# Patient Record
Sex: Female | Born: 1935 | Race: White | Hispanic: No | Marital: Married | Smoking: Never smoker
Health system: Southern US, Community
[De-identification: ages and names within clinical notes are randomized; demographics above are authoritative.]

## PROBLEM LIST (undated history)

## (undated) DIAGNOSIS — R350 Frequency of micturition: Secondary | ICD-10-CM

## (undated) DIAGNOSIS — E785 Hyperlipidemia, unspecified: Secondary | ICD-10-CM

## (undated) DIAGNOSIS — T8459XA Infection and inflammatory reaction due to other internal joint prosthesis, initial encounter: Secondary | ICD-10-CM

## (undated) DIAGNOSIS — I351 Nonrheumatic aortic (valve) insufficiency: Secondary | ICD-10-CM

## (undated) DIAGNOSIS — I340 Nonrheumatic mitral (valve) insufficiency: Secondary | ICD-10-CM

## (undated) DIAGNOSIS — Z87440 Personal history of urinary (tract) infections: Secondary | ICD-10-CM

## (undated) DIAGNOSIS — R351 Nocturia: Secondary | ICD-10-CM

## (undated) DIAGNOSIS — K219 Gastro-esophageal reflux disease without esophagitis: Secondary | ICD-10-CM

## (undated) DIAGNOSIS — Z973 Presence of spectacles and contact lenses: Secondary | ICD-10-CM

## (undated) DIAGNOSIS — I5189 Other ill-defined heart diseases: Secondary | ICD-10-CM

## (undated) DIAGNOSIS — J45909 Unspecified asthma, uncomplicated: Secondary | ICD-10-CM

## (undated) DIAGNOSIS — M8580 Other specified disorders of bone density and structure, unspecified site: Secondary | ICD-10-CM

## (undated) DIAGNOSIS — R32 Unspecified urinary incontinence: Secondary | ICD-10-CM

## (undated) DIAGNOSIS — E669 Obesity, unspecified: Secondary | ICD-10-CM

## (undated) DIAGNOSIS — F419 Anxiety disorder, unspecified: Secondary | ICD-10-CM

## (undated) DIAGNOSIS — C801 Malignant (primary) neoplasm, unspecified: Secondary | ICD-10-CM

## (undated) DIAGNOSIS — I4819 Other persistent atrial fibrillation: Secondary | ICD-10-CM

## (undated) DIAGNOSIS — E079 Disorder of thyroid, unspecified: Secondary | ICD-10-CM

## (undated) DIAGNOSIS — Z96649 Presence of unspecified artificial hip joint: Secondary | ICD-10-CM

## (undated) DIAGNOSIS — I10 Essential (primary) hypertension: Secondary | ICD-10-CM

## (undated) DIAGNOSIS — R3915 Urgency of urination: Secondary | ICD-10-CM

## (undated) DIAGNOSIS — M1990 Unspecified osteoarthritis, unspecified site: Secondary | ICD-10-CM

## (undated) HISTORY — PX: MELANOMA EXCISION: SHX5266

## (undated) HISTORY — PX: BREAST BIOPSY: SHX20

## (undated) HISTORY — PX: ABDOMINAL HYSTERECTOMY: SHX81

## (undated) HISTORY — PX: CHOLECYSTECTOMY: SHX55

## (undated) HISTORY — PX: OTHER SURGICAL HISTORY: SHX169

---

## 2004-07-09 ENCOUNTER — Ambulatory Visit: Payer: Self-pay | Admitting: Unknown Physician Specialty

## 2005-02-25 ENCOUNTER — Ambulatory Visit: Payer: Self-pay | Admitting: Internal Medicine

## 2005-05-21 ENCOUNTER — Ambulatory Visit: Payer: Self-pay | Admitting: Unknown Physician Specialty

## 2005-10-26 ENCOUNTER — Ambulatory Visit: Payer: Self-pay | Admitting: Internal Medicine

## 2006-09-30 ENCOUNTER — Ambulatory Visit: Payer: Self-pay | Admitting: Internal Medicine

## 2006-10-06 ENCOUNTER — Ambulatory Visit: Payer: Self-pay | Admitting: Internal Medicine

## 2006-10-25 ENCOUNTER — Ambulatory Visit: Payer: Self-pay | Admitting: Internal Medicine

## 2006-10-28 ENCOUNTER — Ambulatory Visit: Payer: Self-pay | Admitting: Internal Medicine

## 2006-11-29 ENCOUNTER — Ambulatory Visit: Payer: Self-pay | Admitting: Pain Medicine

## 2006-12-14 ENCOUNTER — Ambulatory Visit: Payer: Self-pay | Admitting: Pain Medicine

## 2006-12-29 ENCOUNTER — Ambulatory Visit: Payer: Self-pay | Admitting: Physician Assistant

## 2007-02-01 ENCOUNTER — Ambulatory Visit: Payer: Self-pay | Admitting: Pain Medicine

## 2007-02-08 ENCOUNTER — Ambulatory Visit: Payer: Self-pay | Admitting: Pain Medicine

## 2007-03-01 ENCOUNTER — Ambulatory Visit: Payer: Self-pay | Admitting: Internal Medicine

## 2007-03-09 ENCOUNTER — Ambulatory Visit: Payer: Self-pay | Admitting: Physician Assistant

## 2007-03-26 ENCOUNTER — Inpatient Hospital Stay: Payer: Self-pay | Admitting: Internal Medicine

## 2007-04-01 ENCOUNTER — Ambulatory Visit: Payer: Self-pay | Admitting: Internal Medicine

## 2007-04-12 ENCOUNTER — Emergency Department: Payer: Self-pay | Admitting: Unknown Physician Specialty

## 2007-04-12 ENCOUNTER — Ambulatory Visit: Payer: Self-pay | Admitting: Internal Medicine

## 2007-05-02 ENCOUNTER — Ambulatory Visit: Payer: Self-pay | Admitting: Internal Medicine

## 2008-02-15 ENCOUNTER — Ambulatory Visit: Payer: Self-pay | Admitting: Internal Medicine

## 2008-08-31 HISTORY — PX: HEMIARTHROPLASTY HIP: SUR652

## 2009-02-15 ENCOUNTER — Ambulatory Visit: Payer: Self-pay | Admitting: Internal Medicine

## 2009-05-29 ENCOUNTER — Ambulatory Visit: Payer: Self-pay | Admitting: Physician Assistant

## 2009-06-11 ENCOUNTER — Ambulatory Visit: Payer: Self-pay | Admitting: Pain Medicine

## 2009-07-02 ENCOUNTER — Ambulatory Visit: Payer: Self-pay | Admitting: Physician Assistant

## 2009-07-06 ENCOUNTER — Inpatient Hospital Stay: Payer: Self-pay | Admitting: Specialist

## 2009-07-12 ENCOUNTER — Encounter: Payer: Self-pay | Admitting: Internal Medicine

## 2010-03-19 ENCOUNTER — Ambulatory Visit: Payer: Self-pay | Admitting: Internal Medicine

## 2011-02-13 ENCOUNTER — Ambulatory Visit: Payer: Self-pay | Admitting: Internal Medicine

## 2011-04-23 ENCOUNTER — Ambulatory Visit: Payer: Self-pay | Admitting: Family Medicine

## 2011-09-01 HISTORY — PX: HEMIARTHROPLASTY HIP: SUR652

## 2011-09-01 HISTORY — PX: INCISION AND DRAINAGE HIP: SHX1801

## 2012-01-20 ENCOUNTER — Ambulatory Visit: Payer: Self-pay | Admitting: Internal Medicine

## 2012-01-20 ENCOUNTER — Inpatient Hospital Stay: Payer: Self-pay | Admitting: Family Medicine

## 2012-01-20 DIAGNOSIS — I10 Essential (primary) hypertension: Secondary | ICD-10-CM

## 2012-01-20 LAB — URINALYSIS, COMPLETE
Bacteria: NONE SEEN
Bilirubin,UR: NEGATIVE
Glucose,UR: NEGATIVE mg/dL (ref 0–75)
Ketone: NEGATIVE
Nitrite: POSITIVE
Ph: 7 (ref 4.5–8.0)
Protein: NEGATIVE
RBC,UR: 8 /HPF (ref 0–5)
Specific Gravity: 1.016 (ref 1.003–1.030)
Squamous Epithelial: <1
WBC UR: 56 /HPF (ref 0–5)

## 2012-01-20 LAB — CBC WITH DIFFERENTIAL/PLATELET
Eosinophil %: 0.1 %
HCT: 38.9 % (ref 35.0–47.0)
HGB: 13 g/dL (ref 12.0–16.0)
Lymphocyte %: 10.4 %
MCH: 31.4 pg (ref 26.0–34.0)
MCV: 94 fL (ref 80–100)
Monocyte #: 0.6 x10 3/mm (ref 0.2–0.9)
Monocyte %: 4.7 %
Neutrophil %: 84.7 %
Platelet: 298 10*3/uL (ref 150–440)
RBC: 4.15 10*6/uL (ref 3.80–5.20)
WBC: 12.8 10*3/uL — ABNORMAL HIGH (ref 3.6–11.0)

## 2012-01-20 LAB — BASIC METABOLIC PANEL
Anion Gap: 9 (ref 7–16)
BUN: 12 mg/dL (ref 7–18)
Calcium, Total: 8.8 mg/dL (ref 8.5–10.1)
Chloride: 91 mmol/L — ABNORMAL LOW (ref 98–107)
Co2: 27 mmol/L (ref 21–32)
Creatinine: 0.63 mg/dL (ref 0.60–1.30)
EGFR (African American): >60
Osmolality: 256 (ref 275–301)
Sodium: 127 mmol/L — ABNORMAL LOW (ref 136–145)

## 2012-01-20 LAB — PROTIME-INR
INR: 0.9
Prothrombin Time: 12.6 secs (ref 11.5–14.7)

## 2012-01-20 LAB — APTT: Activated PTT: 37.2 secs — ABNORMAL HIGH (ref 23.6–35.9)

## 2012-01-21 LAB — CBC WITH DIFFERENTIAL/PLATELET
Basophil #: 0 10*3/uL (ref 0.0–0.1)
Eosinophil #: 0 10*3/uL (ref 0.0–0.7)
Eosinophil %: 0.5 %
HCT: 35.7 % (ref 35.0–47.0)
HGB: 12.2 g/dL (ref 12.0–16.0)
Lymphocyte %: 11.2 %
MCH: 32.3 pg (ref 26.0–34.0)
MCHC: 34.1 g/dL (ref 32.0–36.0)
MCV: 95 fL (ref 80–100)
Monocyte #: 0.5 x10 3/mm (ref 0.2–0.9)
Neutrophil %: 83.3 %
RDW: 12.1 % (ref 11.5–14.5)
WBC: 10.3 10*3/uL (ref 3.6–11.0)

## 2012-01-21 LAB — BASIC METABOLIC PANEL
Anion Gap: 9 (ref 7–16)
Chloride: 92 mmol/L — ABNORMAL LOW (ref 98–107)
EGFR (African American): >60
Glucose: 100 mg/dL — ABNORMAL HIGH (ref 65–99)
Osmolality: 254 (ref 275–301)

## 2012-01-21 LAB — TSH: Thyroid Stimulating Horm: 0.266 u[IU]/mL — ABNORMAL LOW

## 2012-01-22 LAB — CBC WITH DIFFERENTIAL/PLATELET
Basophil %: 0.3 %
Eosinophil #: 0.1 10*3/uL (ref 0.0–0.7)
HCT: 32.3 % — ABNORMAL LOW (ref 35.0–47.0)
HGB: 11.1 g/dL — ABNORMAL LOW (ref 12.0–16.0)
Lymphocyte %: 14.5 %
MCV: 95 fL (ref 80–100)
Monocyte %: 7.6 %
Neutrophil #: 6.4 10*3/uL (ref 1.4–6.5)
Platelet: 241 10*3/uL (ref 150–440)
RBC: 3.4 10*6/uL — ABNORMAL LOW (ref 3.80–5.20)
WBC: 8.4 10*3/uL (ref 3.6–11.0)

## 2012-01-22 LAB — BASIC METABOLIC PANEL
Anion Gap: 9 (ref 7–16)
BUN: 9 mg/dL (ref 7–18)
Calcium, Total: 7.6 mg/dL — ABNORMAL LOW (ref 8.5–10.1)
Chloride: 94 mmol/L — ABNORMAL LOW (ref 98–107)
Co2: 27 mmol/L (ref 21–32)
Creatinine: 0.58 mg/dL — ABNORMAL LOW (ref 0.60–1.30)
EGFR (African American): >60
EGFR (Non-African Amer.): >60
Glucose: 102 mg/dL — ABNORMAL HIGH (ref 65–99)
Potassium: 3.6 mmol/L (ref 3.5–5.1)
Sodium: 130 mmol/L — ABNORMAL LOW (ref 136–145)

## 2012-01-23 LAB — CBC WITH DIFFERENTIAL/PLATELET
Basophil #: 0 10*3/uL (ref 0.0–0.1)
Basophil %: 0.4 %
Eosinophil #: 0.2 10*3/uL (ref 0.0–0.7)
Eosinophil %: 2.4 %
HCT: 32 % — ABNORMAL LOW (ref 35.0–47.0)
HGB: 11 g/dL — ABNORMAL LOW (ref 12.0–16.0)
MCV: 95 fL (ref 80–100)
Monocyte #: 0.7 x10 3/mm (ref 0.2–0.9)
Neutrophil %: 79.2 %
Platelet: 248 10*3/uL (ref 150–440)
RBC: 3.37 10*6/uL — ABNORMAL LOW (ref 3.80–5.20)
WBC: 8.6 10*3/uL (ref 3.6–11.0)

## 2012-01-23 LAB — BASIC METABOLIC PANEL
Anion Gap: 10 (ref 7–16)
BUN: 6 mg/dL — ABNORMAL LOW (ref 7–18)
Calcium, Total: 7.7 mg/dL — ABNORMAL LOW (ref 8.5–10.1)
Co2: 28 mmol/L (ref 21–32)
Creatinine: 0.45 mg/dL — ABNORMAL LOW (ref 0.60–1.30)
EGFR (African American): >60
EGFR (Non-African Amer.): >60
Glucose: 92 mg/dL (ref 65–99)
Osmolality: 258 (ref 275–301)
Potassium: 3.5 mmol/L (ref 3.5–5.1)
Sodium: 130 mmol/L — ABNORMAL LOW (ref 136–145)

## 2012-01-25 LAB — BASIC METABOLIC PANEL
BUN: 11 mg/dL (ref 7–18)
Calcium, Total: 7.7 mg/dL — ABNORMAL LOW (ref 8.5–10.1)
Chloride: 91 mmol/L — ABNORMAL LOW (ref 98–107)
Co2: 28 mmol/L (ref 21–32)
EGFR (Non-African Amer.): >60
Glucose: 89 mg/dL (ref 65–99)
Osmolality: 256 (ref 275–301)
Potassium: 3.1 mmol/L — ABNORMAL LOW (ref 3.5–5.1)
Sodium: 128 mmol/L — ABNORMAL LOW (ref 136–145)

## 2012-01-25 LAB — CBC WITH DIFFERENTIAL/PLATELET
Basophil #: 0 10*3/uL (ref 0.0–0.1)
Basophil %: 0.3 %
Eosinophil %: 3.1 %
HGB: 9.4 g/dL — ABNORMAL LOW (ref 12.0–16.0)
Lymphocyte #: 1.4 10*3/uL (ref 1.0–3.6)
MCH: 32.7 pg (ref 26.0–34.0)
MCHC: 34.7 g/dL (ref 32.0–36.0)
MCV: 94 fL (ref 80–100)
Monocyte #: 1 x10 3/mm — ABNORMAL HIGH (ref 0.2–0.9)
Monocyte %: 10.3 %

## 2012-01-26 LAB — BASIC METABOLIC PANEL
Anion Gap: 8 (ref 7–16)
BUN: 8 mg/dL (ref 7–18)
Calcium, Total: 7.6 mg/dL — ABNORMAL LOW (ref 8.5–10.1)
Chloride: 90 mmol/L — ABNORMAL LOW (ref 98–107)
Co2: 30 mmol/L (ref 21–32)
Creatinine: 0.42 mg/dL — ABNORMAL LOW (ref 0.60–1.30)
EGFR (African American): >60
Potassium: 3.3 mmol/L — ABNORMAL LOW (ref 3.5–5.1)

## 2012-01-26 LAB — CBC WITH DIFFERENTIAL/PLATELET
Basophil #: 0 10*3/uL (ref 0.0–0.1)
Eosinophil %: 2.9 %
HGB: 8.9 g/dL — ABNORMAL LOW (ref 12.0–16.0)
Lymphocyte #: 1.7 10*3/uL (ref 1.0–3.6)
Lymphocyte %: 19.1 %
MCH: 32.7 pg (ref 26.0–34.0)
MCHC: 34.8 g/dL (ref 32.0–36.0)
MCV: 94 fL (ref 80–100)
Monocyte %: 9.9 %
Neutrophil #: 5.9 10*3/uL (ref 1.4–6.5)
Neutrophil %: 67.6 %
WBC: 8.7 10*3/uL (ref 3.6–11.0)

## 2012-01-27 LAB — BASIC METABOLIC PANEL
BUN: 6 mg/dL — ABNORMAL LOW (ref 7–18)
Calcium, Total: 7.8 mg/dL — ABNORMAL LOW (ref 8.5–10.1)
Chloride: 91 mmol/L — ABNORMAL LOW (ref 98–107)
Creatinine: 0.54 mg/dL — ABNORMAL LOW (ref 0.60–1.30)
EGFR (Non-African Amer.): >60
Osmolality: 257 (ref 275–301)
Potassium: 3.6 mmol/L (ref 3.5–5.1)
Sodium: 129 mmol/L — ABNORMAL LOW (ref 136–145)

## 2012-01-27 LAB — CBC WITH DIFFERENTIAL/PLATELET
HCT: 26.4 % — ABNORMAL LOW (ref 35.0–47.0)
HGB: 9.2 g/dL — ABNORMAL LOW (ref 12.0–16.0)
MCHC: 34.9 g/dL (ref 32.0–36.0)
MCV: 95 fL (ref 80–100)
Neutrophil %: 71.6 %
RBC: 2.78 10*6/uL — ABNORMAL LOW (ref 3.80–5.20)
RDW: 12.3 % (ref 11.5–14.5)

## 2012-01-28 ENCOUNTER — Encounter: Payer: Self-pay | Admitting: Internal Medicine

## 2012-01-29 LAB — URINALYSIS, COMPLETE
Bilirubin,UR: NEGATIVE
Blood: NEGATIVE
Glucose,UR: NEGATIVE mg/dL (ref 0–75)
Ketone: NEGATIVE
Protein: NEGATIVE
Specific Gravity: 1.006 (ref 1.003–1.030)
Squamous Epithelial: 1
Transitional Epi: <1
WBC UR: 197 /HPF (ref 0–5)

## 2012-01-30 ENCOUNTER — Encounter: Payer: Self-pay | Admitting: Internal Medicine

## 2012-01-31 LAB — URINE CULTURE

## 2012-02-02 LAB — CBC WITH DIFFERENTIAL/PLATELET
Basophil #: 0.2 10*3/uL — ABNORMAL HIGH (ref 0.0–0.1)
Eosinophil #: 0.1 10*3/uL (ref 0.0–0.7)
Eosinophil %: 1 %
HCT: 32.2 % — ABNORMAL LOW (ref 35.0–47.0)
HGB: 10.5 g/dL — ABNORMAL LOW (ref 12.0–16.0)
Lymphocyte %: 15.7 %
MCH: 32 pg (ref 26.0–34.0)
Monocyte #: 0.7 x10 3/mm (ref 0.2–0.9)
Monocyte %: 6 %
Neutrophil %: 75.8 %
Platelet: 692 10*3/uL — ABNORMAL HIGH (ref 150–440)
RDW: 13.6 % (ref 11.5–14.5)
WBC: 11.5 10*3/uL — ABNORMAL HIGH (ref 3.6–11.0)

## 2012-02-02 LAB — BASIC METABOLIC PANEL
Anion Gap: 12 (ref 7–16)
EGFR (African American): >60
EGFR (Non-African Amer.): >60
Osmolality: 253 (ref 275–301)
Potassium: 3.8 mmol/L (ref 3.5–5.1)

## 2012-02-03 LAB — URINALYSIS, COMPLETE
Bacteria: NONE SEEN
Bilirubin,UR: NEGATIVE
Blood: NEGATIVE
Glucose,UR: NEGATIVE mg/dL (ref 0–75)
Hyaline Cast: 1
Ph: 6 (ref 4.5–8.0)
RBC,UR: 1 /HPF (ref 0–5)
Specific Gravity: 1.015 (ref 1.003–1.030)
Squamous Epithelial: 4
WBC UR: 3 /HPF (ref 0–5)

## 2012-02-11 LAB — BASIC METABOLIC PANEL
Anion Gap: 8 (ref 7–16)
BUN: 10 mg/dL (ref 7–18)
Calcium, Total: 9.1 mg/dL (ref 8.5–10.1)
Chloride: 99 mmol/L (ref 98–107)
Co2: 27 mmol/L (ref 21–32)
Creatinine: 0.61 mg/dL (ref 0.60–1.30)
EGFR (African American): >60
EGFR (Non-African Amer.): >60
Glucose: 99 mg/dL (ref 65–99)
Osmolality: 267 (ref 275–301)
Potassium: 3.3 mmol/L — ABNORMAL LOW (ref 3.5–5.1)
Sodium: 134 mmol/L — ABNORMAL LOW (ref 136–145)

## 2012-02-18 ENCOUNTER — Ambulatory Visit: Payer: Self-pay | Admitting: Orthopedic Surgery

## 2012-02-22 LAB — WOUND CULTURE

## 2012-02-24 LAB — URINALYSIS, COMPLETE
Bilirubin,UR: NEGATIVE
Glucose,UR: NEGATIVE mg/dL (ref 0–75)
Nitrite: POSITIVE
Ph: 7 (ref 4.5–8.0)
RBC,UR: 4 /HPF (ref 0–5)
Specific Gravity: 1.014 (ref 1.003–1.030)
Squamous Epithelial: 3
WBC UR: 52 /HPF (ref 0–5)

## 2012-02-26 ENCOUNTER — Ambulatory Visit: Payer: Self-pay | Admitting: Internal Medicine

## 2012-02-26 LAB — URINE CULTURE

## 2012-02-27 LAB — VANCOMYCIN, TROUGH: Vancomycin, Trough: 1 ug/mL — ABNORMAL LOW (ref 10–20)

## 2012-02-28 LAB — VANCOMYCIN, TROUGH: Vancomycin, Trough: 4 ug/mL — ABNORMAL LOW (ref 10–20)

## 2012-02-28 LAB — CREATININE, SERUM
Creatinine: 0.75 mg/dL (ref 0.60–1.30)
EGFR (Non-African Amer.): >60

## 2012-02-29 ENCOUNTER — Encounter: Payer: Self-pay | Admitting: Internal Medicine

## 2012-02-29 LAB — VANCOMYCIN, TROUGH: Vancomycin, Trough: 8 ug/mL — ABNORMAL LOW (ref 10–20)

## 2012-03-01 LAB — VANCOMYCIN, TROUGH: Vancomycin, Trough: 7 ug/mL — ABNORMAL LOW (ref 10–20)

## 2012-03-01 LAB — CREATININE, SERUM: Creatinine: 0.61 mg/dL (ref 0.60–1.30)

## 2012-03-02 LAB — BASIC METABOLIC PANEL
BUN: 5 mg/dL — ABNORMAL LOW (ref 7–18)
Chloride: 89 mmol/L — ABNORMAL LOW (ref 98–107)
Creatinine: 0.71 mg/dL (ref 0.60–1.30)
EGFR (African American): >60
EGFR (Non-African Amer.): >60
Glucose: 110 mg/dL — ABNORMAL HIGH (ref 65–99)
Osmolality: 249 (ref 275–301)
Potassium: 2.9 mmol/L — ABNORMAL LOW (ref 3.5–5.1)

## 2012-03-02 LAB — CBC WITH DIFFERENTIAL/PLATELET
Basophil %: 0.6 %
Eosinophil #: 0 10*3/uL (ref 0.0–0.7)
Eosinophil %: 0.2 %
HCT: 33.4 % — ABNORMAL LOW (ref 35.0–47.0)
HGB: 10.8 g/dL — ABNORMAL LOW (ref 12.0–16.0)
Lymphocyte #: 1.3 10*3/uL (ref 1.0–3.6)
MCHC: 32.4 g/dL (ref 32.0–36.0)
MCV: 95 fL (ref 80–100)
Monocyte #: 1 x10 3/mm — ABNORMAL HIGH (ref 0.2–0.9)
Monocyte %: 9.8 %
Neutrophil #: 7.8 10*3/uL — ABNORMAL HIGH (ref 1.4–6.5)
Platelet: 509 10*3/uL — ABNORMAL HIGH (ref 150–440)
RBC: 3.52 10*6/uL — ABNORMAL LOW (ref 3.80–5.20)
WBC: 10.1 10*3/uL (ref 3.6–11.0)

## 2012-03-03 ENCOUNTER — Inpatient Hospital Stay: Payer: Self-pay | Admitting: Family Medicine

## 2012-03-03 LAB — COMPREHENSIVE METABOLIC PANEL
Albumin: 2.6 g/dL — ABNORMAL LOW (ref 3.4–5.0)
Alkaline Phosphatase: 125 U/L (ref 50–136)
BUN: 5 mg/dL — ABNORMAL LOW (ref 7–18)
Bilirubin,Total: 0.5 mg/dL (ref 0.2–1.0)
Calcium, Total: 8.2 mg/dL — ABNORMAL LOW (ref 8.5–10.1)
Co2: 27 mmol/L (ref 21–32)
EGFR (Non-African Amer.): >60
Glucose: 105 mg/dL — ABNORMAL HIGH (ref 65–99)
SGOT(AST): 26 U/L (ref 15–37)
SGPT (ALT): 29 U/L
Sodium: 126 mmol/L — ABNORMAL LOW (ref 136–145)

## 2012-03-03 LAB — CBC WITH DIFFERENTIAL/PLATELET
Basophil #: 0 10*3/uL (ref 0.0–0.1)
Basophil %: 0.5 %
Eosinophil %: 0.1 %
HCT: 32.1 % — ABNORMAL LOW (ref 35.0–47.0)
HGB: 10.7 g/dL — ABNORMAL LOW (ref 12.0–16.0)
Lymphocyte #: 1.4 10*3/uL (ref 1.0–3.6)
MCH: 30.7 pg (ref 26.0–34.0)
MCHC: 33.5 g/dL (ref 32.0–36.0)
Monocyte %: 11.7 %
Neutrophil #: 5.3 10*3/uL (ref 1.4–6.5)
Platelet: 537 10*3/uL — ABNORMAL HIGH (ref 150–440)

## 2012-03-03 LAB — URINALYSIS, COMPLETE
Bacteria: NONE SEEN
Bilirubin,UR: NEGATIVE
Glucose,UR: NEGATIVE mg/dL (ref 0–75)
Leukocyte Esterase: NEGATIVE
Ph: 7 (ref 4.5–8.0)
Specific Gravity: 1.014 (ref 1.003–1.030)
Squamous Epithelial: NONE SEEN

## 2012-03-03 LAB — SEDIMENTATION RATE: Erythrocyte Sed Rate: 64 mm/hr — ABNORMAL HIGH (ref 0–30)

## 2012-03-03 LAB — MAGNESIUM: Magnesium: 1.4 mg/dL — ABNORMAL LOW

## 2012-03-04 LAB — CBC WITH DIFFERENTIAL/PLATELET
Basophil #: 0.1 10*3/uL (ref 0.0–0.1)
HCT: 31.2 % — ABNORMAL LOW (ref 35.0–47.0)
Lymphocyte #: 1.8 10*3/uL (ref 1.0–3.6)
Lymphocyte %: 31.8 %
Monocyte %: 14.3 %
Neutrophil #: 3 10*3/uL (ref 1.4–6.5)
Neutrophil %: 51.8 %
Platelet: 523 10*3/uL — ABNORMAL HIGH (ref 150–440)
RDW: 13.8 % (ref 11.5–14.5)
WBC: 5.7 10*3/uL (ref 3.6–11.0)

## 2012-03-04 LAB — BASIC METABOLIC PANEL
Chloride: 97 mmol/L — ABNORMAL LOW (ref 98–107)
Co2: 30 mmol/L (ref 21–32)
Creatinine: 0.68 mg/dL (ref 0.60–1.30)
Potassium: 3.5 mmol/L (ref 3.5–5.1)

## 2012-03-04 LAB — URINE CULTURE

## 2012-03-06 LAB — BASIC METABOLIC PANEL
BUN: 4 mg/dL — ABNORMAL LOW (ref 7–18)
Calcium, Total: 8.1 mg/dL — ABNORMAL LOW (ref 8.5–10.1)
Chloride: 97 mmol/L — ABNORMAL LOW (ref 98–107)
Creatinine: 0.54 mg/dL — ABNORMAL LOW (ref 0.60–1.30)
Potassium: 3.7 mmol/L (ref 3.5–5.1)
Sodium: 132 mmol/L — ABNORMAL LOW (ref 136–145)

## 2012-03-07 LAB — VANCOMYCIN, TROUGH: Vancomycin, Trough: 3 ug/mL — ABNORMAL LOW (ref 10–20)

## 2012-03-07 LAB — BASIC METABOLIC PANEL
Anion Gap: 7 (ref 7–16)
Calcium, Total: 8.8 mg/dL (ref 8.5–10.1)
Chloride: 94 mmol/L — ABNORMAL LOW (ref 98–107)
Co2: 27 mmol/L (ref 21–32)
EGFR (African American): >60
Glucose: 93 mg/dL (ref 65–99)
Sodium: 128 mmol/L — ABNORMAL LOW (ref 136–145)

## 2012-03-07 LAB — WOUND CULTURE

## 2012-03-08 LAB — CULTURE, BLOOD (SINGLE)

## 2012-03-25 LAB — COMPREHENSIVE METABOLIC PANEL
Albumin: 3.1 g/dL — ABNORMAL LOW (ref 3.4–5.0)
Anion Gap: 12 (ref 7–16)
Calcium, Total: 9.7 mg/dL (ref 8.5–10.1)
Chloride: 88 mmol/L — ABNORMAL LOW (ref 98–107)
EGFR (African American): >60
Glucose: 101 mg/dL — ABNORMAL HIGH (ref 65–99)
Potassium: 3.4 mmol/L — ABNORMAL LOW (ref 3.5–5.1)
SGOT(AST): 17 U/L (ref 15–37)

## 2012-03-25 LAB — CK TOTAL AND CKMB (NOT AT ARMC)
CK, Total: 42 U/L (ref 21–215)
CK-MB: <0.5 ng/mL — ABNORMAL LOW (ref 0.5–3.6)

## 2012-03-25 LAB — URINALYSIS, COMPLETE
Bacteria: NONE SEEN
Bilirubin,UR: NEGATIVE
Glucose,UR: NEGATIVE mg/dL (ref 0–75)
Hyaline Cast: 3
Ketone: NEGATIVE
Protein: NEGATIVE
RBC,UR: 1 /HPF (ref 0–5)
Specific Gravity: 1.009 (ref 1.003–1.030)
Squamous Epithelial: 17
Transitional Epi: <1
WBC UR: 4 /HPF (ref 0–5)

## 2012-03-25 LAB — CBC
HGB: 11.6 g/dL — ABNORMAL LOW (ref 12.0–16.0)
MCH: 30.4 pg (ref 26.0–34.0)
Platelet: 541 10*3/uL — ABNORMAL HIGH (ref 150–440)
RBC: 3.83 10*6/uL (ref 3.80–5.20)
WBC: 10.3 10*3/uL (ref 3.6–11.0)

## 2012-03-25 LAB — PRO B NATRIURETIC PEPTIDE: B-Type Natriuretic Peptide: 105 pg/mL (ref 0–450)

## 2012-03-26 LAB — CBC WITH DIFFERENTIAL/PLATELET
Basophil #: 0.1 10*3/uL (ref 0.0–0.1)
Basophil %: 0.9 %
Eosinophil #: 0.1 10*3/uL (ref 0.0–0.7)
HCT: 32.1 % — ABNORMAL LOW (ref 35.0–47.0)
HGB: 11.1 g/dL — ABNORMAL LOW (ref 12.0–16.0)
Lymphocyte #: 1.7 10*3/uL (ref 1.0–3.6)
Lymphocyte %: 26.1 %
MCHC: 34.5 g/dL (ref 32.0–36.0)
MCV: 89 fL (ref 80–100)
Monocyte %: 10.5 %
Neutrophil #: 4 10*3/uL (ref 1.4–6.5)
Neutrophil %: 61.5 %
RDW: 14.4 % (ref 11.5–14.5)

## 2012-03-26 LAB — BASIC METABOLIC PANEL
Anion Gap: 7 (ref 7–16)
Chloride: 98 mmol/L (ref 98–107)
EGFR (African American): 50 — ABNORMAL LOW
EGFR (Non-African Amer.): 43 — ABNORMAL LOW
Osmolality: 265 (ref 275–301)
Sodium: 133 mmol/L — ABNORMAL LOW (ref 136–145)

## 2012-03-26 LAB — PHOSPHORUS: Phosphorus: 3.6 mg/dL (ref 2.5–4.9)

## 2012-03-26 LAB — SEDIMENTATION RATE: Erythrocyte Sed Rate: 64 mm/hr — ABNORMAL HIGH (ref 0–30)

## 2012-03-26 LAB — MAGNESIUM: Magnesium: 1.1 mg/dL — ABNORMAL LOW

## 2012-03-27 ENCOUNTER — Ambulatory Visit: Payer: Self-pay | Admitting: Orthopaedic Surgery

## 2012-03-27 ENCOUNTER — Inpatient Hospital Stay: Payer: Self-pay | Admitting: Family Medicine

## 2012-03-27 LAB — BASIC METABOLIC PANEL
BUN: 8 mg/dL (ref 7–18)
Chloride: 98 mmol/L (ref 98–107)
Co2: 28 mmol/L (ref 21–32)
Creatinine: 0.89 mg/dL (ref 0.60–1.30)
EGFR (African American): >60
Glucose: 88 mg/dL (ref 65–99)
Osmolality: 268 (ref 275–301)
Sodium: 135 mmol/L — ABNORMAL LOW (ref 136–145)

## 2012-03-27 LAB — RETICULOCYTES
Absolute Retic Count: 0.04 10*6/uL (ref 0.023–0.096)
Reticulocyte: 1.2 % (ref 0.5–2.2)

## 2012-03-27 LAB — CBC WITH DIFFERENTIAL/PLATELET
Bands: 2 %
Eosinophil: 6 %
HGB: 10.4 g/dL — ABNORMAL LOW (ref 12.0–16.0)
Lymphocytes: 32 %
MCH: 30.6 pg (ref 26.0–34.0)
Monocytes: 6 %
RBC: 3.4 10*6/uL — ABNORMAL LOW (ref 3.80–5.20)
Segmented Neutrophils: 53 %
Variant Lymphocyte - H1-Rlymph: 1 %
WBC: 5.7 10*3/uL (ref 3.6–11.0)

## 2012-03-27 LAB — IRON AND TIBC
Iron: 32 ug/dL — ABNORMAL LOW (ref 50–170)
Unbound Iron-Bind.Cap.: 240 ug/dL

## 2012-03-27 LAB — PHOSPHORUS: Phosphorus: 4 mg/dL (ref 2.5–4.9)

## 2012-03-28 LAB — BASIC METABOLIC PANEL
BUN: 4 mg/dL — ABNORMAL LOW (ref 7–18)
Calcium, Total: 8.5 mg/dL (ref 8.5–10.1)
Co2: 27 mmol/L (ref 21–32)
EGFR (African American): >60
EGFR (Non-African Amer.): >60
Glucose: 88 mg/dL (ref 65–99)
Osmolality: 266 (ref 275–301)
Potassium: 3.7 mmol/L (ref 3.5–5.1)

## 2012-03-28 LAB — CBC WITH DIFFERENTIAL/PLATELET
Basophil #: 0 10*3/uL (ref 0.0–0.1)
Eosinophil #: 0.1 10*3/uL (ref 0.0–0.7)
Eosinophil %: 1.1 %
Lymphocyte #: 1.9 10*3/uL (ref 1.0–3.6)
MCHC: 34.4 g/dL (ref 32.0–36.0)
Monocyte %: 10.4 %
Neutrophil #: 3.6 10*3/uL (ref 1.4–6.5)
Platelet: 429 10*3/uL (ref 150–440)
RDW: 14.6 % — ABNORMAL HIGH (ref 11.5–14.5)
WBC: 6.3 10*3/uL (ref 3.6–11.0)

## 2012-03-28 LAB — MAGNESIUM: Magnesium: 0.9 mg/dL — ABNORMAL LOW

## 2012-03-29 LAB — PATHOLOGY REPORT

## 2012-03-29 LAB — CBC WITH DIFFERENTIAL/PLATELET
Basophil %: 0.8 %
Eosinophil %: 1.4 %
HCT: 31.4 % — ABNORMAL LOW (ref 35.0–47.0)
HGB: 11 g/dL — ABNORMAL LOW (ref 12.0–16.0)
Lymphocyte #: 2.3 10*3/uL (ref 1.0–3.6)
MCV: 88 fL (ref 80–100)
Monocyte #: 0.7 x10 3/mm (ref 0.2–0.9)
RBC: 3.55 10*6/uL — ABNORMAL LOW (ref 3.80–5.20)
WBC: 7.2 10*3/uL (ref 3.6–11.0)

## 2012-03-29 LAB — BASIC METABOLIC PANEL
Anion Gap: 9 (ref 7–16)
Co2: 27 mmol/L (ref 21–32)
Creatinine: 0.74 mg/dL (ref 0.60–1.30)
EGFR (African American): >60
Potassium: 3.8 mmol/L (ref 3.5–5.1)
Sodium: 136 mmol/L (ref 136–145)

## 2012-04-27 LAB — URINALYSIS, COMPLETE
Nitrite: NEGATIVE
Ph: 7 (ref 4.5–8.0)
Protein: NEGATIVE
Specific Gravity: 1.006 (ref 1.003–1.030)

## 2012-04-27 LAB — BASIC METABOLIC PANEL
Anion Gap: 9 (ref 7–16)
BUN: 5 mg/dL — ABNORMAL LOW (ref 7–18)
Chloride: 94 mmol/L — ABNORMAL LOW (ref 98–107)
Co2: 28 mmol/L (ref 21–32)
Creatinine: 0.6 mg/dL (ref 0.60–1.30)
EGFR (African American): >60
Sodium: 131 mmol/L — ABNORMAL LOW (ref 136–145)

## 2012-04-27 LAB — CBC WITH DIFFERENTIAL/PLATELET
Basophil #: 0 10*3/uL (ref 0.0–0.1)
Basophil %: 0.2 %
Eosinophil #: 0 10*3/uL (ref 0.0–0.7)
Eosinophil %: 0.1 %
HGB: 9.4 g/dL — ABNORMAL LOW (ref 12.0–16.0)
Lymphocyte %: 15.4 %
MCH: 28.3 pg (ref 26.0–34.0)
MCHC: 33.3 g/dL (ref 32.0–36.0)
MCV: 85 fL (ref 80–100)
Monocyte %: 7.7 %
Neutrophil #: 10.9 10*3/uL — ABNORMAL HIGH (ref 1.4–6.5)
Neutrophil %: 76.6 %
RBC: 3.32 10*6/uL — ABNORMAL LOW (ref 3.80–5.20)
RDW: 15.2 % — ABNORMAL HIGH (ref 11.5–14.5)

## 2012-04-28 ENCOUNTER — Inpatient Hospital Stay: Payer: Self-pay | Admitting: Family Medicine

## 2012-04-28 LAB — CBC WITH DIFFERENTIAL/PLATELET
Basophil %: 0.5 %
Eosinophil #: 0.1 10*3/uL (ref 0.0–0.7)
Eosinophil %: 0.8 %
HCT: 25.7 % — ABNORMAL LOW (ref 35.0–47.0)
HGB: 8.7 g/dL — ABNORMAL LOW (ref 12.0–16.0)
Lymphocyte #: 2.4 10*3/uL (ref 1.0–3.6)
Lymphocyte %: 19.8 %
MCH: 28.5 pg (ref 26.0–34.0)
MCV: 84 fL (ref 80–100)
Monocyte #: 0.9 x10 3/mm (ref 0.2–0.9)
Monocyte %: 7.8 %
Neutrophil #: 8.4 10*3/uL — ABNORMAL HIGH (ref 1.4–6.5)
Platelet: 484 10*3/uL — ABNORMAL HIGH (ref 150–440)
RDW: 15.2 % — ABNORMAL HIGH (ref 11.5–14.5)
WBC: 11.9 10*3/uL — ABNORMAL HIGH (ref 3.6–11.0)

## 2012-04-28 LAB — BASIC METABOLIC PANEL
Anion Gap: 10 (ref 7–16)
BUN: 3 mg/dL — ABNORMAL LOW (ref 7–18)
Chloride: 95 mmol/L — ABNORMAL LOW (ref 98–107)
Creatinine: 0.47 mg/dL — ABNORMAL LOW (ref 0.60–1.30)
EGFR (African American): >60
EGFR (Non-African Amer.): >60
Glucose: 102 mg/dL — ABNORMAL HIGH (ref 65–99)
Osmolality: 259 (ref 275–301)
Sodium: 131 mmol/L — ABNORMAL LOW (ref 136–145)

## 2012-04-28 LAB — MAGNESIUM: Magnesium: 0.6 mg/dL — ABNORMAL LOW

## 2012-04-28 LAB — HEPATIC FUNCTION PANEL A (ARMC)
Albumin: 2 g/dL — ABNORMAL LOW (ref 3.4–5.0)
Alkaline Phosphatase: 158 U/L — ABNORMAL HIGH (ref 50–136)
Bilirubin, Direct: 0.1 mg/dL (ref 0.00–0.20)
Bilirubin,Total: 0.5 mg/dL (ref 0.2–1.0)
SGOT(AST): 25 U/L (ref 15–37)

## 2012-04-29 LAB — BASIC METABOLIC PANEL
Anion Gap: 8 (ref 7–16)
BUN: <1 mg/dL — ABNORMAL LOW (ref 7–18)
Calcium, Total: 6.8 mg/dL — CL (ref 8.5–10.1)
Creatinine: 0.49 mg/dL — ABNORMAL LOW (ref 0.60–1.30)
EGFR (Non-African Amer.): >60
Glucose: 92 mg/dL (ref 65–99)

## 2012-04-29 LAB — CBC WITH DIFFERENTIAL/PLATELET
Basophil %: 0.7 %
Eosinophil %: 2.8 %
HCT: 26 % — ABNORMAL LOW (ref 35.0–47.0)
Lymphocyte #: 1.8 10*3/uL (ref 1.0–3.6)
Lymphocyte %: 22.4 %
MCH: 28.9 pg (ref 26.0–34.0)
MCV: 84 fL (ref 80–100)
Monocyte %: 7.7 %
Neutrophil #: 5.4 10*3/uL (ref 1.4–6.5)
Neutrophil %: 66.4 %
Platelet: 465 10*3/uL — ABNORMAL HIGH (ref 150–440)
RBC: 3.08 10*6/uL — ABNORMAL LOW (ref 3.80–5.20)
WBC: 8.1 10*3/uL (ref 3.6–11.0)

## 2012-04-30 LAB — COMPREHENSIVE METABOLIC PANEL
Albumin: 2.2 g/dL — ABNORMAL LOW (ref 3.4–5.0)
Anion Gap: 8 (ref 7–16)
BUN: 1 mg/dL — ABNORMAL LOW (ref 7–18)
Calcium, Total: 6.9 mg/dL — CL (ref 8.5–10.1)
Co2: 25 mmol/L (ref 21–32)
Creatinine: 0.5 mg/dL — ABNORMAL LOW (ref 0.60–1.30)
EGFR (African American): >60
EGFR (Non-African Amer.): >60
Potassium: 3.8 mmol/L (ref 3.5–5.1)
SGOT(AST): 25 U/L (ref 15–37)
Sodium: 133 mmol/L — ABNORMAL LOW (ref 136–145)

## 2012-04-30 LAB — TSH: Thyroid Stimulating Horm: 0.024 u[IU]/mL — ABNORMAL LOW

## 2012-04-30 LAB — CBC WITH DIFFERENTIAL/PLATELET
Basophil #: 0.1 10*3/uL (ref 0.0–0.1)
Eosinophil #: 0.2 10*3/uL (ref 0.0–0.7)
HCT: 26.4 % — ABNORMAL LOW (ref 35.0–47.0)
HGB: 9.1 g/dL — ABNORMAL LOW (ref 12.0–16.0)
Lymphocyte %: 22 %
MCHC: 34.3 g/dL (ref 32.0–36.0)
Monocyte #: 0.6 x10 3/mm (ref 0.2–0.9)
Neutrophil #: 5.1 10*3/uL (ref 1.4–6.5)
Neutrophil %: 66.9 %
Platelet: 494 10*3/uL — ABNORMAL HIGH (ref 150–440)
RDW: 15.5 % — ABNORMAL HIGH (ref 11.5–14.5)
WBC: 7.6 10*3/uL (ref 3.6–11.0)

## 2012-04-30 LAB — URINE CULTURE

## 2012-05-01 LAB — CBC WITH DIFFERENTIAL/PLATELET
Basophil #: 0.1 10*3/uL (ref 0.0–0.1)
Basophil %: 0.7 %
Eosinophil #: 0.2 10*3/uL (ref 0.0–0.7)
HCT: 27.1 % — ABNORMAL LOW (ref 35.0–47.0)
Lymphocyte #: 1.7 10*3/uL (ref 1.0–3.6)
Lymphocyte %: 22.9 %
MCHC: 34 g/dL (ref 32.0–36.0)
MCV: 85 fL (ref 80–100)
Monocyte #: 0.7 x10 3/mm (ref 0.2–0.9)
Monocyte %: 9.2 %
Neutrophil #: 4.9 10*3/uL (ref 1.4–6.5)
Platelet: 547 10*3/uL — ABNORMAL HIGH (ref 150–440)
RBC: 3.19 10*6/uL — ABNORMAL LOW (ref 3.80–5.20)
RDW: 15.6 % — ABNORMAL HIGH (ref 11.5–14.5)
WBC: 7.6 10*3/uL (ref 3.6–11.0)

## 2012-05-01 LAB — BASIC METABOLIC PANEL
BUN: <1 mg/dL — ABNORMAL LOW (ref 7–18)
Co2: 23 mmol/L (ref 21–32)
Creatinine: 0.49 mg/dL — ABNORMAL LOW (ref 0.60–1.30)
EGFR (African American): >60
EGFR (Non-African Amer.): >60
Potassium: 4.3 mmol/L (ref 3.5–5.1)
Sodium: 133 mmol/L — ABNORMAL LOW (ref 136–145)

## 2012-05-02 LAB — CBC WITH DIFFERENTIAL/PLATELET
Basophil #: 0 x10 3/mm 3 (ref 0.0–0.1)
Basophil %: 0.5 %
Eosinophil #: 0.1 x10 3/mm 3 (ref 0.0–0.7)
Eosinophil %: 1.2 %
HCT: 28.4 % — ABNORMAL LOW (ref 35.0–47.0)
HGB: 9.6 g/dL — ABNORMAL LOW (ref 12.0–16.0)
Lymphocyte %: 14.3 %
Lymphs Abs: 1.4 x10 3/mm 3 (ref 1.0–3.6)
MCH: 29 pg (ref 26.0–34.0)
MCHC: 33.7 g/dL (ref 32.0–36.0)
MCV: 86 fL (ref 80–100)
Monocyte #: 0.7 x10 3/mm (ref 0.2–0.9)
Monocyte %: 6.9 %
Neutrophil #: 7.5 x10 3/mm 3 — ABNORMAL HIGH (ref 1.4–6.5)
Neutrophil %: 77.1 %
Platelet: 602 x10 3/mm 3 — ABNORMAL HIGH (ref 150–440)
RBC: 3.29 X10 6/mm 3 — ABNORMAL LOW (ref 3.80–5.20)
RDW: 15.5 % — ABNORMAL HIGH (ref 11.5–14.5)
WBC: 9.7 x10 3/mm 3 (ref 3.6–11.0)

## 2012-05-02 LAB — BASIC METABOLIC PANEL WITH GFR
Anion Gap: 6 — ABNORMAL LOW (ref 7–16)
BUN: 1 mg/dL — ABNORMAL LOW (ref 7–18)
Calcium, Total: 7.2 mg/dL — ABNORMAL LOW (ref 8.5–10.1)
Chloride: 101 mmol/L (ref 98–107)
Co2: 23 mmol/L (ref 21–32)
Creatinine: 0.49 mg/dL — ABNORMAL LOW (ref 0.60–1.30)
EGFR (African American): >60
EGFR (Non-African Amer.): >60
Glucose: 91 mg/dL (ref 65–99)
Osmolality: 256 (ref 275–301)
Potassium: 4.8 mmol/L (ref 3.5–5.1)
Sodium: 130 mmol/L — ABNORMAL LOW (ref 136–145)

## 2012-05-02 LAB — CLOSTRIDIUM DIFFICILE BY PCR

## 2012-05-02 LAB — MAGNESIUM: Magnesium: 1.5 mg/dL — ABNORMAL LOW

## 2012-05-03 LAB — CBC WITH DIFFERENTIAL/PLATELET
Basophil %: 0.4 %
HCT: 27.8 % — ABNORMAL LOW (ref 35.0–47.0)
Lymphocyte #: 1.7 10*3/uL (ref 1.0–3.6)
Lymphocyte %: 17.9 %
MCHC: 33.3 g/dL (ref 32.0–36.0)
MCV: 86 fL (ref 80–100)
Monocyte #: 0.8 x10 3/mm (ref 0.2–0.9)
Neutrophil #: 6.6 10*3/uL — ABNORMAL HIGH (ref 1.4–6.5)
Neutrophil %: 71.7 %
Platelet: 593 10*3/uL — ABNORMAL HIGH (ref 150–440)
RDW: 15.8 % — ABNORMAL HIGH (ref 11.5–14.5)

## 2012-05-03 LAB — BASIC METABOLIC PANEL
Anion Gap: 6 — ABNORMAL LOW (ref 7–16)
BUN: <1 mg/dL — ABNORMAL LOW (ref 7–18)
Calcium, Total: 7.8 mg/dL — ABNORMAL LOW (ref 8.5–10.1)
Co2: 25 mmol/L (ref 21–32)
EGFR (African American): >60
EGFR (Non-African Amer.): >60
Glucose: 86 mg/dL (ref 65–99)
Potassium: 4.9 mmol/L (ref 3.5–5.1)

## 2012-05-04 LAB — BASIC METABOLIC PANEL
Anion Gap: 5 — ABNORMAL LOW (ref 7–16)
Chloride: 98 mmol/L (ref 98–107)
Co2: 27 mmol/L (ref 21–32)
Creatinine: 0.6 mg/dL (ref 0.60–1.30)
EGFR (African American): >60
Potassium: 4.1 mmol/L (ref 3.5–5.1)
Sodium: 130 mmol/L — ABNORMAL LOW (ref 136–145)

## 2012-05-04 LAB — CBC WITH DIFFERENTIAL/PLATELET
Basophil #: 0 10*3/uL (ref 0.0–0.1)
Eosinophil #: 0.1 10*3/uL (ref 0.0–0.7)
Lymphocyte #: 1.7 10*3/uL (ref 1.0–3.6)
Lymphocyte %: 22.6 %
MCHC: 34.2 g/dL (ref 32.0–36.0)
MCV: 85 fL (ref 80–100)
Monocyte #: 0.7 x10 3/mm (ref 0.2–0.9)
Monocyte %: 9.7 %
Platelet: 601 10*3/uL — ABNORMAL HIGH (ref 150–440)
RDW: 15.8 % — ABNORMAL HIGH (ref 11.5–14.5)
WBC: 7.4 10*3/uL (ref 3.6–11.0)

## 2012-05-05 LAB — STOOL CULTURE

## 2012-09-13 ENCOUNTER — Ambulatory Visit: Payer: Self-pay | Admitting: Orthopedic Surgery

## 2012-09-13 LAB — PROTIME-INR
INR: 0.9
Prothrombin Time: 12.3 secs (ref 11.5–14.7)

## 2012-09-13 LAB — BODY FLUID CELL COUNT WITH DIFFERENTIAL
Basophil: 0 %
Eosinophil: 0 %
Lymphocytes: 3 %

## 2012-09-13 LAB — APTT: Activated PTT: 42.5 secs — ABNORMAL HIGH (ref 23.6–35.9)

## 2012-09-13 LAB — PLATELET COUNT: Platelet: 554 10*3/uL — ABNORMAL HIGH (ref 150–440)

## 2012-09-14 ENCOUNTER — Inpatient Hospital Stay: Payer: Self-pay | Admitting: Orthopedic Surgery

## 2012-09-14 LAB — URINALYSIS, COMPLETE
Bilirubin,UR: NEGATIVE
Ph: 6 (ref 4.5–8.0)
Protein: NEGATIVE
Squamous Epithelial: <1
WBC UR: 5 /HPF (ref 0–5)

## 2012-09-14 LAB — CBC WITH DIFFERENTIAL/PLATELET
Basophil #: 0.1 10*3/uL (ref 0.0–0.1)
Eosinophil %: 1 %
HCT: 30.6 % — ABNORMAL LOW (ref 35.0–47.0)
HGB: 10.2 g/dL — ABNORMAL LOW (ref 12.0–16.0)
Lymphocyte #: 2.7 10*3/uL (ref 1.0–3.6)
Lymphocyte %: 34.6 %
MCHC: 33.3 g/dL (ref 32.0–36.0)
MCV: 84 fL (ref 80–100)
Monocyte #: 0.6 x10 3/mm (ref 0.2–0.9)
Platelet: 472 10*3/uL — ABNORMAL HIGH (ref 150–440)
RDW: 16.1 % — ABNORMAL HIGH (ref 11.5–14.5)

## 2012-09-14 LAB — SEDIMENTATION RATE: Erythrocyte Sed Rate: 69 mm/hr — ABNORMAL HIGH (ref 0–30)

## 2012-09-14 LAB — BASIC METABOLIC PANEL
Anion Gap: 7 (ref 7–16)
Chloride: 105 mmol/L (ref 98–107)
Co2: 28 mmol/L (ref 21–32)
Creatinine: 0.56 mg/dL — ABNORMAL LOW (ref 0.60–1.30)
EGFR (African American): >60
EGFR (Non-African Amer.): >60
Osmolality: 280 (ref 275–301)
Potassium: 3.7 mmol/L (ref 3.5–5.1)
Sodium: 140 mmol/L (ref 136–145)

## 2012-09-15 HISTORY — PX: INCISION AND DRAINAGE HIP: SHX1801

## 2012-09-15 LAB — BASIC METABOLIC PANEL
Anion Gap: 9 (ref 7–16)
Calcium, Total: 7.9 mg/dL — ABNORMAL LOW (ref 8.5–10.1)
Chloride: 105 mmol/L (ref 98–107)
Creatinine: 0.46 mg/dL — ABNORMAL LOW (ref 0.60–1.30)
EGFR (African American): >60
EGFR (Non-African Amer.): >60
Osmolality: 276 (ref 275–301)
Sodium: 139 mmol/L (ref 136–145)

## 2012-09-15 LAB — CBC WITH DIFFERENTIAL/PLATELET
Basophil #: 0.1 10*3/uL (ref 0.0–0.1)
Eosinophil #: 0.1 10*3/uL (ref 0.0–0.7)
HCT: 27.3 % — ABNORMAL LOW (ref 35.0–47.0)
Lymphocyte #: 1.9 10*3/uL (ref 1.0–3.6)
MCHC: 32.8 g/dL (ref 32.0–36.0)
MCV: 85 fL (ref 80–100)
Monocyte #: 0.4 x10 3/mm (ref 0.2–0.9)
Monocyte %: 6.1 %
Neutrophil #: 4.4 10*3/uL (ref 1.4–6.5)
Neutrophil %: 63.4 %
Platelet: 378 10*3/uL (ref 150–440)
RBC: 3.22 10*6/uL — ABNORMAL LOW (ref 3.80–5.20)
WBC: 7 10*3/uL (ref 3.6–11.0)

## 2012-09-16 LAB — CBC WITH DIFFERENTIAL/PLATELET
Basophil #: 0 10*3/uL (ref 0.0–0.1)
Basophil %: 0.1 %
HCT: 25.2 % — ABNORMAL LOW (ref 35.0–47.0)
Lymphocyte #: 0.6 10*3/uL — ABNORMAL LOW (ref 1.0–3.6)
MCH: 27.9 pg (ref 26.0–34.0)
MCHC: 33 g/dL (ref 32.0–36.0)
Monocyte #: 0.1 x10 3/mm — ABNORMAL LOW (ref 0.2–0.9)
Monocyte %: 1.2 %
Neutrophil #: 6.5 10*3/uL (ref 1.4–6.5)
Neutrophil %: 90.7 %
RBC: 2.98 10*6/uL — ABNORMAL LOW (ref 3.80–5.20)
RDW: 15.5 % — ABNORMAL HIGH (ref 11.5–14.5)
WBC: 7.1 10*3/uL (ref 3.6–11.0)

## 2012-09-16 LAB — BASIC METABOLIC PANEL
Anion Gap: 10 (ref 7–16)
BUN: 10 mg/dL (ref 7–18)
Calcium, Total: 7.7 mg/dL — ABNORMAL LOW (ref 8.5–10.1)
Chloride: 98 mmol/L (ref 98–107)
Creatinine: 0.56 mg/dL — ABNORMAL LOW (ref 0.60–1.30)
EGFR (African American): >60
Osmolality: 266 (ref 275–301)
Potassium: 3.7 mmol/L (ref 3.5–5.1)

## 2012-09-16 LAB — URINE CULTURE

## 2012-09-17 LAB — CBC WITH DIFFERENTIAL/PLATELET
Basophil #: 0 10*3/uL (ref 0.0–0.1)
Eosinophil #: 0 10*3/uL (ref 0.0–0.7)
Eosinophil %: 0 %
HGB: 7.7 g/dL — ABNORMAL LOW (ref 12.0–16.0)
MCH: 28 pg (ref 26.0–34.0)
MCHC: 32.9 g/dL (ref 32.0–36.0)
MCV: 85 fL (ref 80–100)
Monocyte #: 0.7 x10 3/mm (ref 0.2–0.9)
Monocyte %: 6.1 %
Platelet: 371 10*3/uL (ref 150–440)
RBC: 2.76 10*6/uL — ABNORMAL LOW (ref 3.80–5.20)
RDW: 15.7 % — ABNORMAL HIGH (ref 11.5–14.5)
WBC: 11.9 10*3/uL — ABNORMAL HIGH (ref 3.6–11.0)

## 2012-09-17 LAB — BASIC METABOLIC PANEL
BUN: 8 mg/dL (ref 7–18)
Calcium, Total: 7.4 mg/dL — ABNORMAL LOW (ref 8.5–10.1)
Co2: 26 mmol/L (ref 21–32)
Creatinine: 0.44 mg/dL — ABNORMAL LOW (ref 0.60–1.30)
EGFR (African American): >60
Glucose: 82 mg/dL (ref 65–99)
Osmolality: 277 (ref 275–301)
Sodium: 140 mmol/L (ref 136–145)

## 2012-09-17 LAB — URINALYSIS, COMPLETE
Bacteria: NONE SEEN
Ketone: NEGATIVE
Protein: NEGATIVE
Squamous Epithelial: 4

## 2012-09-18 LAB — BEHAVIORAL MEDICINE 1 PANEL
Alkaline Phosphatase: 140 U/L — ABNORMAL HIGH (ref 50–136)
Anion Gap: 8 (ref 7–16)
BUN: 6 mg/dL — ABNORMAL LOW (ref 7–18)
Bilirubin,Total: 0.3 mg/dL (ref 0.2–1.0)
Calcium, Total: 7.1 mg/dL — ABNORMAL LOW (ref 8.5–10.1)
Chloride: 104 mmol/L (ref 98–107)
Co2: 26 mmol/L (ref 21–32)
Creatinine: 0.39 mg/dL — ABNORMAL LOW (ref 0.60–1.30)
EGFR (African American): >60
EGFR (Non-African Amer.): >60
Eosinophil #: 0.1 10*3/uL (ref 0.0–0.7)
Eosinophil %: 1.1 %
HCT: 23.4 % — ABNORMAL LOW (ref 35.0–47.0)
HGB: 7.7 g/dL — ABNORMAL LOW (ref 12.0–16.0)
Lymphocyte #: 2.4 10*3/uL (ref 1.0–3.6)
MCH: 28.3 pg (ref 26.0–34.0)
Monocyte %: 7.7 %
Neutrophil %: 62.8 %
Osmolality: 272 (ref 275–301)
Platelet: 372 10*3/uL (ref 150–440)
RBC: 2.72 10*6/uL — ABNORMAL LOW (ref 3.80–5.20)
RDW: 16.2 % — ABNORMAL HIGH (ref 11.5–14.5)
SGOT(AST): 17 U/L (ref 15–37)
Sodium: 138 mmol/L (ref 136–145)
Total Protein: 5.6 g/dL — ABNORMAL LOW (ref 6.4–8.2)
WBC: 8.6 10*3/uL (ref 3.6–11.0)

## 2012-09-19 LAB — BASIC METABOLIC PANEL
BUN: 5 mg/dL — ABNORMAL LOW (ref 7–18)
Chloride: 98 mmol/L (ref 98–107)
Co2: 30 mmol/L (ref 21–32)
Creatinine: 0.47 mg/dL — ABNORMAL LOW (ref 0.60–1.30)
EGFR (African American): >60
EGFR (Non-African Amer.): >60
Glucose: 90 mg/dL (ref 65–99)
Potassium: 3.9 mmol/L (ref 3.5–5.1)

## 2012-09-19 LAB — CBC WITH DIFFERENTIAL/PLATELET
Basophil #: 0.1 10*3/uL (ref 0.0–0.1)
Eosinophil #: 0.2 10*3/uL (ref 0.0–0.7)
HCT: 27.8 % — ABNORMAL LOW (ref 35.0–47.0)
Lymphocyte #: 2.5 10*3/uL (ref 1.0–3.6)
MCHC: 33 g/dL (ref 32.0–36.0)
MCV: 85 fL (ref 80–100)
Monocyte #: 0.5 x10 3/mm (ref 0.2–0.9)
Monocyte %: 6.4 %
RDW: 16.2 % — ABNORMAL HIGH (ref 11.5–14.5)
WBC: 7.9 10*3/uL (ref 3.6–11.0)

## 2012-09-19 LAB — VANCOMYCIN, TROUGH: Vancomycin, Trough: 12 ug/mL (ref 10–20)

## 2012-09-20 LAB — CBC WITH DIFFERENTIAL/PLATELET
Basophil #: 0.1 10*3/uL (ref 0.0–0.1)
Eosinophil #: 0.3 10*3/uL (ref 0.0–0.7)
Eosinophil %: 4.3 %
HCT: 27.8 % — ABNORMAL LOW (ref 35.0–47.0)
HGB: 9.5 g/dL — ABNORMAL LOW (ref 12.0–16.0)
Lymphocyte #: 2.2 10*3/uL (ref 1.0–3.6)
Lymphocyte %: 29.3 %
MCHC: 34.2 g/dL (ref 32.0–36.0)
MCV: 85 fL (ref 80–100)
Monocyte %: 8.1 %
Neutrophil #: 4.4 10*3/uL (ref 1.4–6.5)
Neutrophil %: 57.6 %
Platelet: 475 10*3/uL — ABNORMAL HIGH (ref 150–440)
RBC: 3.26 10*6/uL — ABNORMAL LOW (ref 3.80–5.20)
RDW: 16.1 % — ABNORMAL HIGH (ref 11.5–14.5)

## 2012-09-20 LAB — BASIC METABOLIC PANEL
BUN: 3 mg/dL — ABNORMAL LOW (ref 7–18)
Calcium, Total: 8.6 mg/dL (ref 8.5–10.1)
Chloride: 99 mmol/L (ref 98–107)
Creatinine: 0.49 mg/dL — ABNORMAL LOW (ref 0.60–1.30)
Potassium: 4.1 mmol/L (ref 3.5–5.1)
Sodium: 135 mmol/L — ABNORMAL LOW (ref 136–145)

## 2012-09-20 LAB — CULTURE, BLOOD (SINGLE)

## 2012-10-16 ENCOUNTER — Other Ambulatory Visit: Payer: Self-pay | Admitting: Orthopedic Surgery

## 2012-10-16 LAB — COMPREHENSIVE METABOLIC PANEL
Albumin: 3 g/dL — ABNORMAL LOW (ref 3.4–5.0)
Alkaline Phosphatase: 120 U/L (ref 50–136)
Anion Gap: 8 (ref 7–16)
Bilirubin,Total: 0.6 mg/dL (ref 0.2–1.0)
Calcium, Total: 8.7 mg/dL (ref 8.5–10.1)
Chloride: 98 mmol/L (ref 98–107)
Co2: 29 mmol/L (ref 21–32)
Creatinine: 0.87 mg/dL (ref 0.60–1.30)
EGFR (African American): >60
Glucose: 86 mg/dL (ref 65–99)
Potassium: 3.2 mmol/L — ABNORMAL LOW (ref 3.5–5.1)
SGOT(AST): 13 U/L — ABNORMAL LOW (ref 15–37)
SGPT (ALT): 11 U/L — ABNORMAL LOW (ref 12–78)
Total Protein: 7.1 g/dL (ref 6.4–8.2)

## 2012-10-16 LAB — VANCOMYCIN, TROUGH: Vancomycin, Trough: 24 ug/mL (ref 10–20)

## 2012-10-16 LAB — CBC WITH DIFFERENTIAL/PLATELET
Basophil %: 0.8 %
Eosinophil #: 0.3 10*3/uL (ref 0.0–0.7)
Eosinophil %: 3.3 %
HCT: 31.3 % — ABNORMAL LOW (ref 35.0–47.0)
MCH: 28.1 pg (ref 26.0–34.0)
Monocyte #: 0.8 x10 3/mm (ref 0.2–0.9)
Neutrophil %: 70.8 %
RDW: 15.4 % — ABNORMAL HIGH (ref 11.5–14.5)

## 2012-10-16 LAB — SEDIMENTATION RATE: Erythrocyte Sed Rate: 82 mm/hr — ABNORMAL HIGH (ref 0–30)

## 2013-01-11 ENCOUNTER — Ambulatory Visit: Payer: Self-pay | Admitting: Family Medicine

## 2013-10-21 IMAGING — CR DG CHEST 1V
1 series · 1 of 1 positions shown · non-contrast
Comparison: none

REASON FOR EXAM: hypertension
COMMENTS:

PROCEDURE:     DXR - DXR CHEST 1 VIEWAP OR PA  - January 20, 2012  [DATE]
RESULT:

[ap]
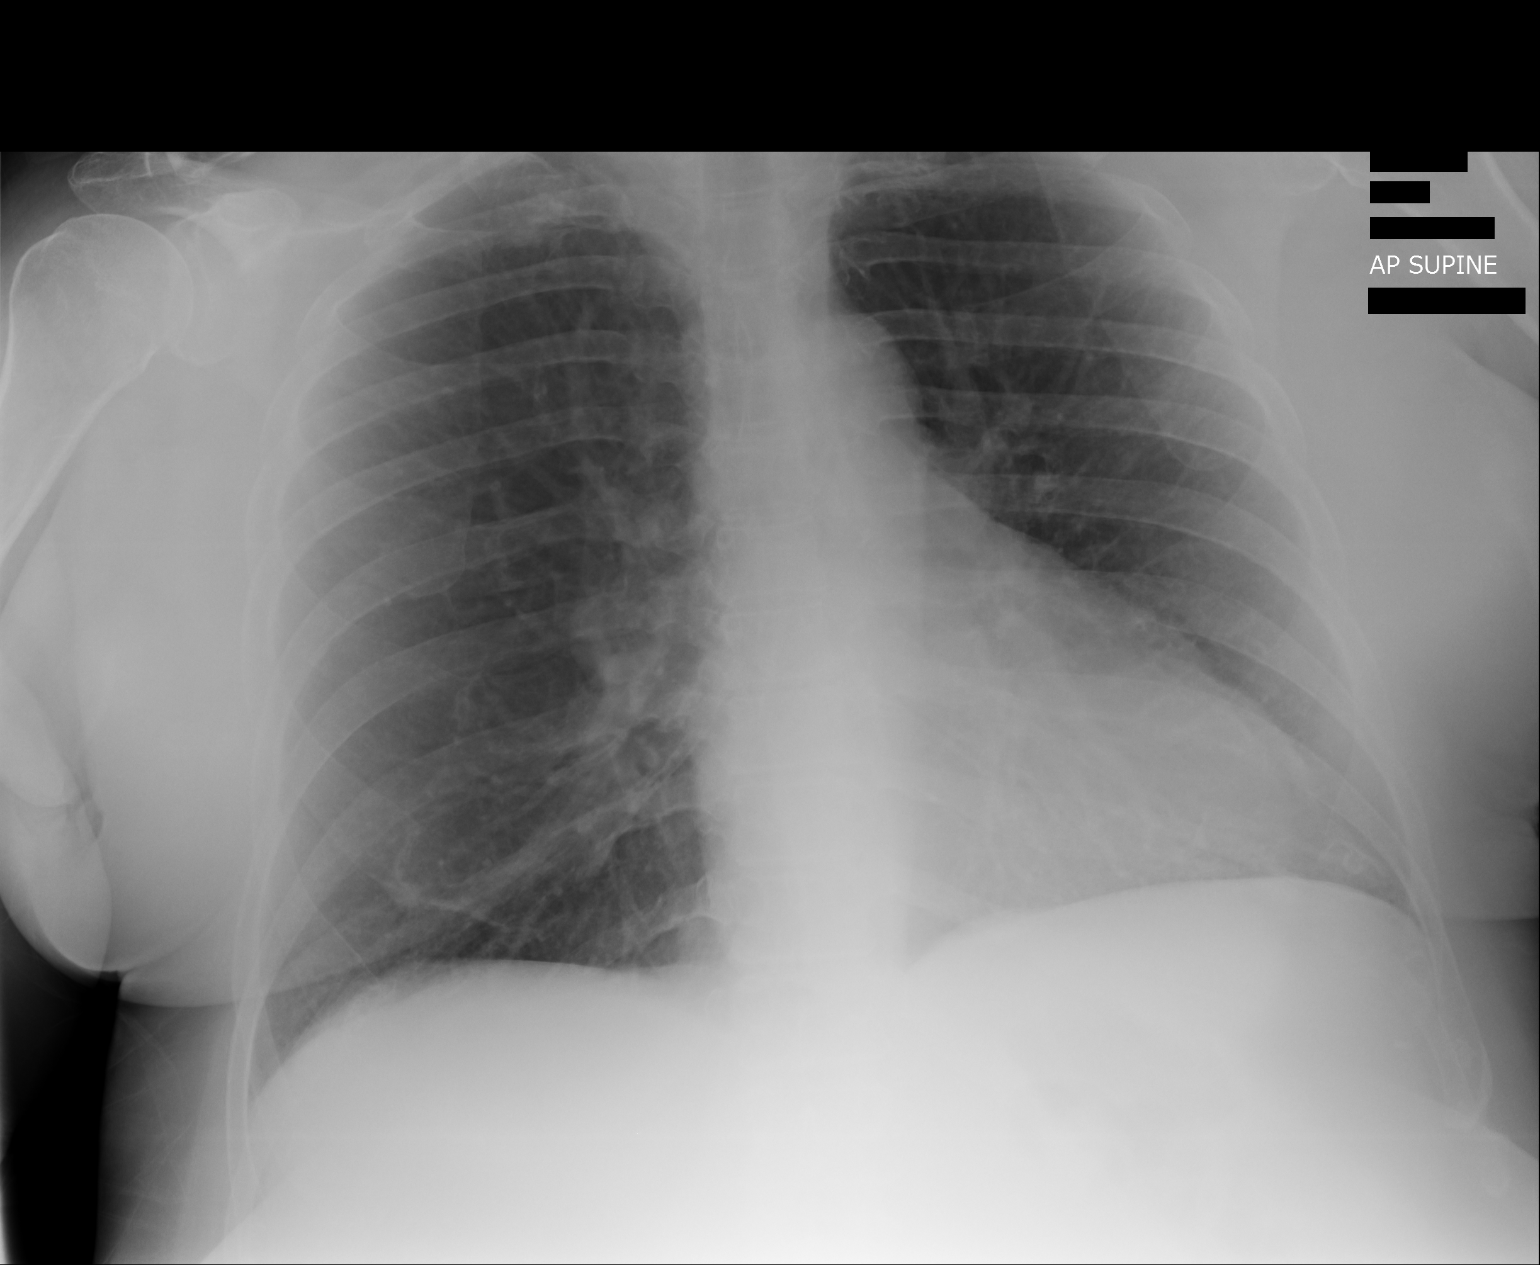

[1 of 1 positions shown; findings below may reference images not displayed]

FINDINGS: There is no evidence of foal infiltrates, effusions or edema. A
small nodular density projects within the right lung base. Differential
considerations are scar versus atelectasis though a primary pulmonary nodule
cannot be excluded. Repeat two-view chest radiograph is recommended. If this
finding persists, further evaluation with chest CT is recommended. The
cardiac silhouette and visualized bony skeleton are unremarkable.
IMPRESSION: 1. No evidence of acute cardiopulmonary disease.

2. Nodular density right lung base as described above.

## 2013-12-03 IMAGING — CR RIGHT HIP - COMPLETE 2+ VIEW
1 series · 2 of 2 positions shown · non-contrast
Comparison: none

REASON FOR EXAM: hip pain/infection
COMMENTS:

[Series 1: t hip ap right · 0.14mm/px · 2 of 2 slices shown]
[im 1/2]
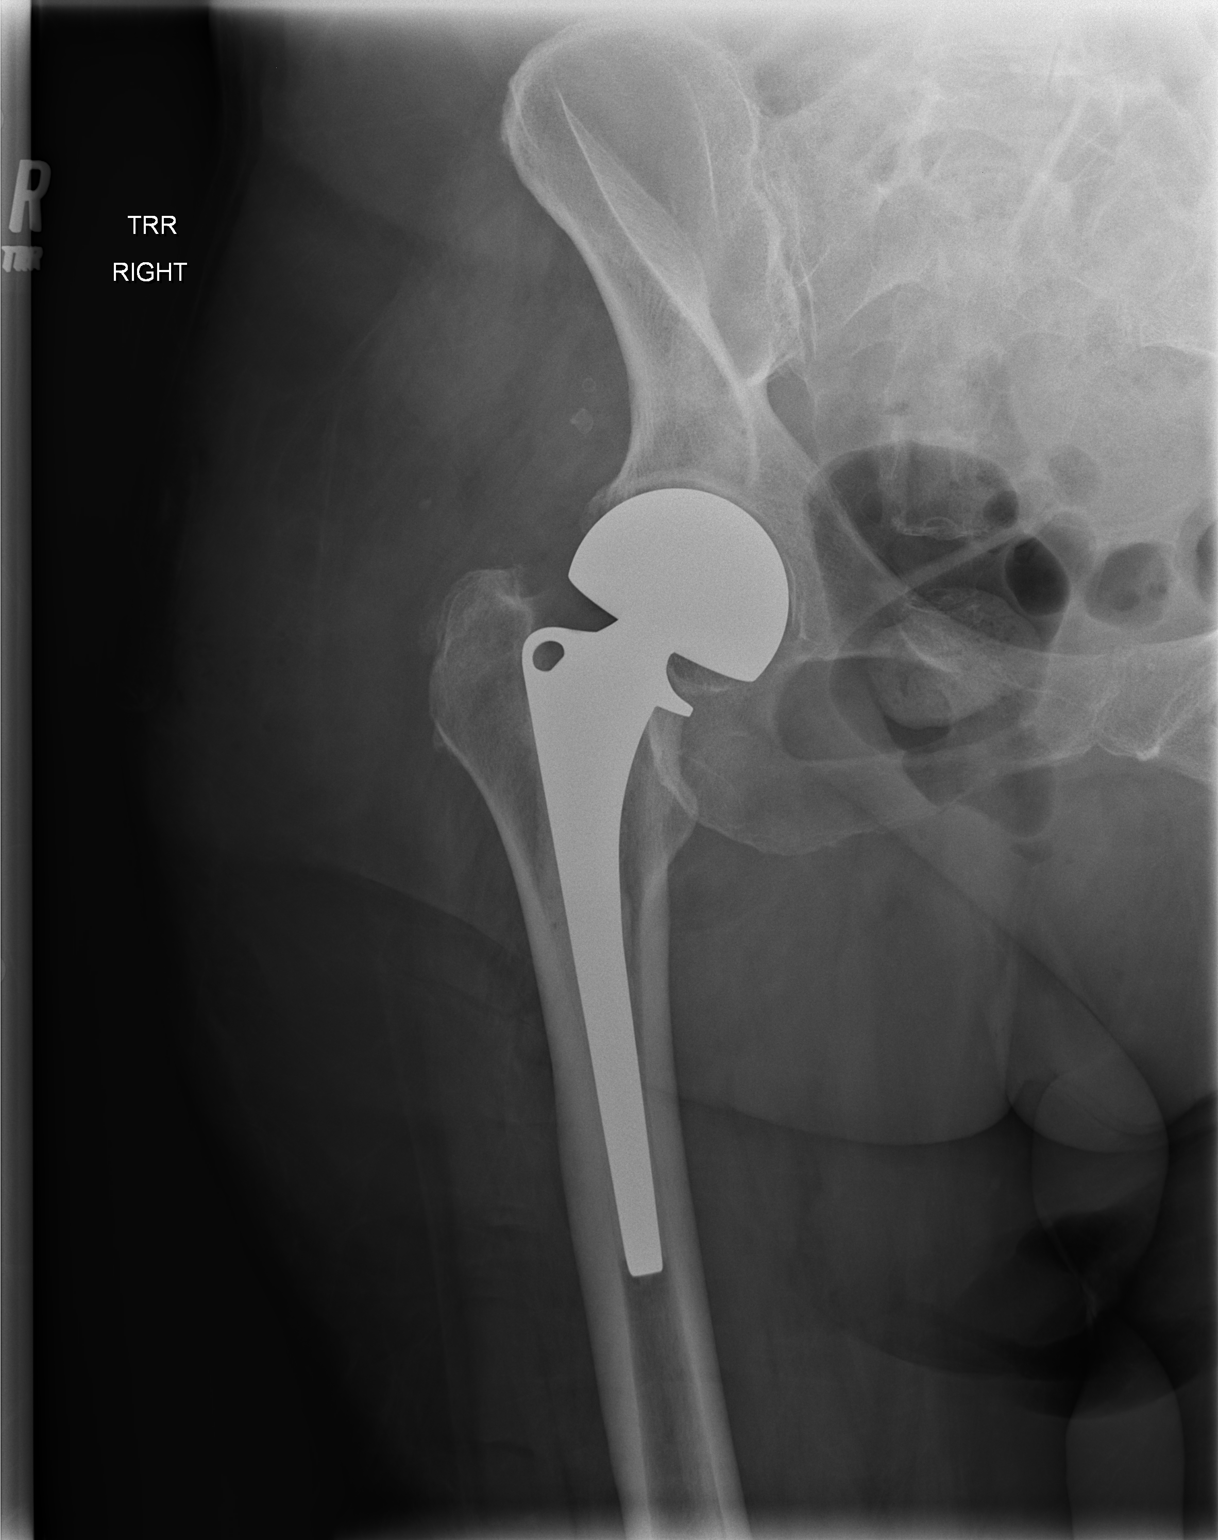
[im 2/2]
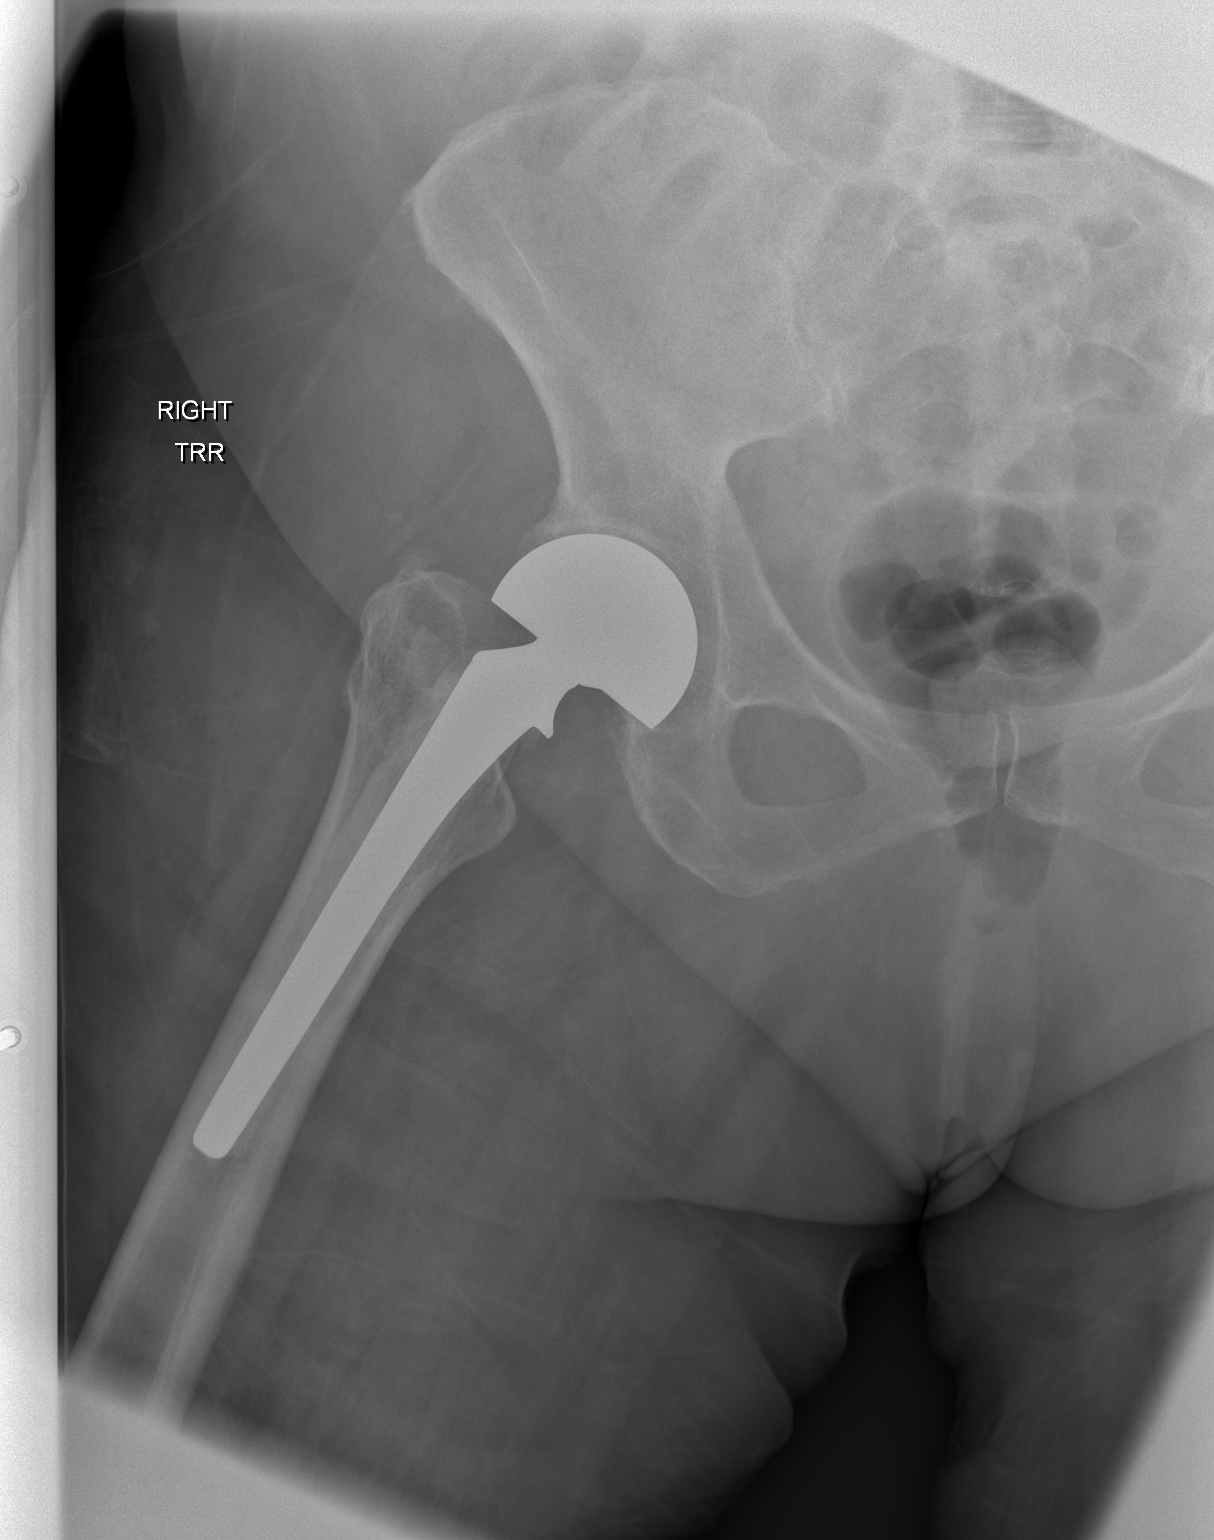

[2 of 2 positions shown; findings below may reference images not displayed]

PROCEDURE:     DXR - DXR HIP RIGHT COMPLETE  - March 03, 2012 [DATE]

RESULT:     AP and frog-leg lateral views of the right hip reveal the
patient has undergone previous hip joint replacement. Comparison is made to
postoperative studies 21 January, 2012. The prosthetic components appear
normally positioned. No fracture of the native bone is seen. No abnormal
periosteal reaction is demonstrated. The observed portions of the right
hemipelvis appear normal. The surrounding soft tissues exhibit no acute
abnormality.
IMPRESSION: There is no acute bony abnormality of the right hip.

## 2014-02-27 ENCOUNTER — Ambulatory Visit: Payer: Self-pay | Admitting: Family Medicine

## 2014-03-13 ENCOUNTER — Ambulatory Visit: Payer: Self-pay | Admitting: Orthopedic Surgery

## 2014-03-13 LAB — URINALYSIS, COMPLETE
BILIRUBIN, UR: NEGATIVE
Bacteria: NONE SEEN
Blood: NEGATIVE
GLUCOSE, UR: NEGATIVE mg/dL (ref 0–75)
KETONE: NEGATIVE
Leukocyte Esterase: NEGATIVE
Nitrite: NEGATIVE
PROTEIN: NEGATIVE
Ph: 5 (ref 4.5–8.0)
Specific Gravity: 1.03 (ref 1.003–1.030)
Squamous Epithelial: 1
WBC UR: 1 /HPF (ref 0–5)

## 2014-03-13 LAB — PROTIME-INR
INR: 1
PROTHROMBIN TIME: 13.2 s (ref 11.5–14.7)

## 2014-03-13 LAB — SEDIMENTATION RATE: ERYTHROCYTE SED RATE: 12 mm/h (ref 0–30)

## 2014-03-13 LAB — APTT: Activated PTT: 36.3 secs — ABNORMAL HIGH (ref 23.6–35.9)

## 2014-03-13 LAB — MRSA PCR SCREENING

## 2014-03-15 LAB — URINE CULTURE

## 2014-03-27 ENCOUNTER — Inpatient Hospital Stay: Payer: Self-pay | Admitting: Orthopedic Surgery

## 2014-03-27 HISTORY — PX: OTHER SURGICAL HISTORY: SHX169

## 2014-03-28 LAB — BASIC METABOLIC PANEL
Anion Gap: 7 (ref 7–16)
BUN: 13 mg/dL (ref 7–18)
CALCIUM: 7.8 mg/dL — AB (ref 8.5–10.1)
Chloride: 103 mmol/L (ref 98–107)
Co2: 27 mmol/L (ref 21–32)
Creatinine: 0.95 mg/dL (ref 0.60–1.30)
GFR CALC NON AF AMER: 57 — AB
GLUCOSE: 89 mg/dL (ref 65–99)
OSMOLALITY: 273 (ref 275–301)
POTASSIUM: 4.2 mmol/L (ref 3.5–5.1)
Sodium: 137 mmol/L (ref 136–145)

## 2014-03-28 LAB — HEMOGLOBIN: HGB: 11.4 g/dL — ABNORMAL LOW (ref 12.0–16.0)

## 2014-03-28 LAB — PLATELET COUNT: Platelet: 235 10*3/uL (ref 150–440)

## 2014-03-30 ENCOUNTER — Encounter: Payer: Self-pay | Admitting: Internal Medicine

## 2014-03-31 ENCOUNTER — Encounter: Payer: Self-pay | Admitting: Internal Medicine

## 2014-03-31 LAB — WOUND CULTURE

## 2014-12-18 NOTE — H&P (Signed)
PATIENT NAME:  Andrea Griffin, Andrea Griffin MR#:  [id number] DATE OF BIRTH:  [birth year]  DATE OF ADMISSION:  03/25/2012  REFERRING PHYSICIAN: Dr. Robet Leu.   PRIMARY CARE PHYSICIAN: Dr. Netty Starring at [facility]. ORTHOPEDIC SURGEON Dr Rudene Christians.    CHIEF COMPLAINT: Nausea and vomiting.   HISTORY OF PRESENT ILLNESS: The patient is a pleasant 81 year old Caucasian female with history of right hip hemiarthroplasty on 29/92/4268 with complications including hematoma and wound infection with sepsis, post incision and drainage at bedside on 03/03/2012 and wound VAC placement and antibiotic treatment which she has finished since, comes in with nausea and vomiting starting this morning. The patient was in her usual state of self until this morning. She has had continuous nausea which is better with some Zofran here. She has vomited once which was large quantity. The patient felt dehydrated and came in here. The patient has no abdominal pain. There are no sick contacts. There is some constipation but there is no diarrhea. She has abdominal x-rays done which shows no obstruction and no acute abnormalities. She has responded somewhat to Reglan and several doses of Zofran. Hospitalist service was contacted for evaluation and admission for symptom management.   PAST MEDICAL HISTORY:  1. Hypothyroidism. 2. Hyperlipidemia.  3. Gastroesophageal reflux disease.  4. Asthma. 5. Hypertension. 6. Obesity. 7. Chronic hyponatremia. 8. Anemia. 9. Status post right hip hemiarthroplasty with hematoma and wound infection, currently undergoing physical therapy. 10. Anxiety. 11. Hysterectomy. 12. Bladder surgery for recurrent cystitis. 13. Cholecystectomy.   ALLERGIES: Sulfa, Cipro, Macrodantin, ranitidine, amoxicillin, erythromycin, quinolones, trimethoprim.   MEDICATIONS:  1. Advair 100/50 mcg 1 puff inhaled 2 times a day. 2. Allegra 180 mg daily. 3. Amlodipine 10 mg daily.  4. Aspirin 325 mg daily.  5. Atorvastatin 20 mg  daily.  6. Fluticasone 50 mcg 2 sprays nasally once a day.  7. Hydrochlorothiazide/losartan 25/100 mg 1 tab daily.  8. Levothyroxine 50 mcg daily.  9. Nucynta 50 mg 1 to 2 tabs every 4 hours as needed for pain.  10. Omeprazole 20 mg daily.  11. Zofran 4 mg every 4 hours as needed for nausea, vomiting. 12. Tylenol p.r.n.   FAMILY HISTORY: Positive for heart disease and leukemia in father.   SOCIAL HISTORY: No tobacco, alcohol or drug use. Is married.   REVIEW OF SYSTEMS: CONSTITUTIONAL: Patient has had about 30 pounds weight loss since her surgery. Feels global weakness without any focal weakness.   EYES: No blurry vision or double vision.   ENT: No tinnitus or hearing loss.   RESPIRATORY: No cough, wheezing, or shortness of breath. Has history of asthma.   CARDIOVASCULAR: No chest pain, no orthopnea. Some dizziness today. No swelling in the legs. No palpitations.   GI: Nausea and vomiting as above. No abdominal pain. Positive for constipation. No diarrhea. No hematemesis. No bright red blood per rectum.   GENITOURINARY: Denies dysuria or hematuria.   HEME/LYMPH: Denies anemia or easy bruising.   SKIN: Denies any new rashes.   MUSCULOSKELETAL: Still has some head pain on the right side.  NEUROLOGIC: Denies weakness focally. No numbness. No dementia or transient ischemic attack history.   PSYCH: The patient has anxiety. No insomnia.   PHYSICAL EXAMINATION:  VITAL SIGNS: Temperature on arrival 97, pulse 89, respiratory rate 22, blood pressure 152/66, O2 saturation 100% on room air. Last pulse rate is 128. Last blood pressure is 191/100. While I was in the room blood pressure was systolic 341D.   GENERAL: The patient is an elderly  Caucasian female laying in bed, obese. No obvious distress, talking in full sentences.   HEENT: Normocephalic, atraumatic. Pupils are equal and reactive. Anicteric sclerae. Moist mucous membranes. Extraocular muscles intact.   NECK: Supple. No  thyroid tenderness.   CARDIOVASCULAR: S1, S2, regular rate and rhythm. No murmurs, rubs, or gallops.   LUNGS: Clear to auscultation without wheezing or rhonchi.   ABDOMEN: Soft, nontender, nondistended. Positive bowel sounds in all quadrants. No rebound, no guarding.   EXTREMITIES: No significant lower extremity edema. There is a bandage on the lateral side of the right hip.   NEUROLOGIC: Cranial nerves II through XII are grossly intact. Strength is 5/5 in all extremities.   PSYCH: Awake, alert, oriented x3.   LABORATORIES: Creatinine 1.01, sodium 126, potassium 3.4, chloride 88. Note: Last sodium was 128, potassium 3.7 and chloride 94 as of March 07, 2012.  LFTs within normal limits except for albumin of 3.1. Troponin negative. CK-MB negative x1.  WBC of 10.8, hemoglobin is 11.6, hematocrit is 33.7, platelets are 541,000. Urinalysis not suggestive of infection.   X-ray abdomen, 3-way including PA of the chest showing no bowel obstruction or perforation. No acute cardiopulmonary disease. X-ray of the hip on the right complete, no significant interval change from prior fracture or hardware complications otherwise. EKG: Normal sinus rhythm.  There are some nonspecific ST abnormalities, rate is 94, no acute ST elevations or depressions.   ASSESSMENT AND PLAN: We have a 81 year old female with hypothyroidism, hyperlipidemia, gastroesophageal reflux disease, asthma, hypertension, and obesity. Is status post a right hip hemiarthroplasty with complications of hematoma and wound infections, presenting with nausea and vomiting and some global weakness. Furthermore, there are some lab abnormalities including mild hyponatremia which is worse than baseline and some hypokalemia. At this point we will admit the patient for observation. She is hemodynamically stable. We would start with symptomatic management including IV fluids with normal saline and IV Zofran. Would replace the potassium. We would monitor the  sodium. Would hold hydrochlorothiazide given the hypokalemia and hyponatremia. Hypokalemia might also be secondary to GI losses. She has no fever or leukocytosis or any abdominal pain or tenderness on exam. She has negative x-rays as well. She has a normal lipase as well. We will see how she does with conservative treatment. In regards to the hypothyroidism, we would continue the Synthroid. I would continue her statin. We would start her on clears for now and advance as tolerated. Would continue her outpatient medications for her hypertension except that we would hold hydrochlorothiazide for now. Would start her on deep vein thrombosis prophylaxis and PPI as well.   TOTAL TIME SPENT: 50 minutes.   CODE STATUS: THE PATIENT IS FULL CODE.  ____________________________ Vivien Presto, MD sa:vtd D: 03/25/2012 21:42:28 ET  T: 03/26/2012 07:48:07 ET  JOB#: [id number] cc: Vivien Presto, MD, <Dictator> Dion Body, MD Vivien Presto MD ELECTRONICALLY SIGNED 04/12/2012 15:21

## 2014-12-18 NOTE — Consult Note (Signed)
Brief Consult Note: Diagnosis: Lipoma left thigh.   Patient was seen by consultant.   Comments: 81 year old female had left hip hemiarthroplasty a few years ago and has done well. No pain or problems noted.   Has a lump in the soft tissues behind the hip. Had right hip surgery in May this year with post op infection.   Exam:  3x3cm lump in soft tissues.  Freely mobile.  No pain.  Hip has full range of motion.  No reddness. circulation/sensation/motor function good distally.    X-rays: Both prostheses appear normal and unchanged.   Imp:  Probable lipoma  Rx:  No treatment or surgery indicated at this time.  Patient and husband are comfortable with this..  Electronic Signatures: Park Breed (MD)  (Signed 01-Sep-13 13:41)  Authored: Brief Consult Note   Last Updated: 01-Sep-13 13:41 by Park Breed (MD)

## 2014-12-18 NOTE — Consult Note (Signed)
PATIENT NAME:  Andrea Griffin, Andrea Griffin MR#:  [id number] DATE OF BIRTH:  24-Jan-1932  DATE OF CONSULTATION:  04/28/2012  REFERRING PHYSICIAN:  Dr. Netty Starring CONSULTING PHYSICIAN:  Jill Side, MD  REASON FOR CONSULTATION: Nausea, vomiting, diarrhea, and abdominal pain.   HISTORY OF PRESENT ILLNESS: The patient is a 81 year old female with multiple medical problems. The patient was admitted from the office of Dr. Netty Starring the day before yesterday when she presented to him with vomiting, nausea, and diarrhea for several days. The patient was recently in the hospital with vomiting last month. She was seen by Dr. Candace Cruise at that point and an EGD was quite unremarkable. She was discharged home recently and according to the husband she has been having trouble with nausea and vomiting as well as diarrhea since then. She is unable to keep anything down according to the husband and has been having several loose watery bowel movements a day. She denies any hematochezia, melena, or hematemesis. No sick contacts or recent travel.   PAST MEDICAL HISTORY: Hypothyroidism, hyperlipidemia, gastroesophageal reflux disease, asthma, hypertension, obesity, anxiety.   PAST SURGICAL HISTORY: Hysterectomy, cholecystectomy.   HOME MEDICATIONS:  Levothyroxine, Flonase, fexofenadine, calcium, vitamin D, Lipitor, losartan, Advair, Zofran, and Tylenol.   ALLERGIES: Multiple allergies have been listed but according to her, she is not allergic to all of the list of medications. She is allergic to Cipro according to her. She is not allergic to penicillin but believes that she is probably allergic to sulfa as well.   FAMILY AND SOCIAL HISTORY: Quite unremarkable.    PHYSICAL EXAMINATION:  GENERAL: Obese female, does not appear to be in any acute distress.   VITAL SIGNS: Temperature 98, heart rate is in the 90s, respiration 18 to 20, blood pressure is about 130/70.   HEENT: Grossly unremarkable except for some redness in both eyes.    NECK:  Neck veins are flat.   LUNGS: Grossly clear to auscultation bilaterally with fair entry.   CARDIOVASCULAR: Regular rate and rhythm. No gallops or murmurs were heard.   ABDOMEN: Soft. Mild diffuse tenderness was noted. Mild distention was noted as well. Bowel sounds are positive and fairly normal. No rebound or guarding was noted.   NEUROLOGIC: Grossly unremarkable.   LABORATORY, DIAGNOSTIC, AND RADIOLOGICAL DATA: Electrolytes are fairly unremarkable except for potassium, which was low. Liver enzymes are normal. Alkaline phosphatase is minimally elevated at 158. White cell count was 14,000 on admission, is down to 11.9, hemoglobin was 9.4 on admission, is down to 8.7, hematocrit 25.7, and platelet count 484. Urine culture shows more than 100,000 gram-negative rods. Further sensitivities pending.  CT scan of the abdomen and pelvis showed thickening of the wall of the sigmoid colon suspicious of colitis. Mild distention of the biliary tree was noted although this could be secondary to cholecystectomy in the past.   ASSESSMENT AND PLAN:  Patient with nausea, vomiting, diarrhea, and CT scan that is suggestive of sigmoid colitis. We are maybe dealing with a viral colitis; bacterial colitis is a possibility as well. Stool for C. difficile toxin is negative. The patient's white cell count was high raising concerns about possible bacterial colitis, although because of her multiple allergies she was not started on antibiotics. I started her on metronidazole yesterday. If patient does not improve significantly within the next 24 hours or so we will use a penicillin such as Augmentin as well. It appears that the patient also has a urinary tract infection which can also cause nausea, vomiting, and diarrhea  in some cases, and once the sensitivities are available she should be treated for urinary tract infection, hopefully again with a penicillin as according to her she has no known allergies penicillin. The  patient will require a colonoscopy or sigmoidoscopy after the resolution of acute infection. The patient also showed signs of biliary dilatation, which I do not believe is secondary to biliary obstruction as liver enzymes are normal. CT scan also showed a small lesion in the area of the ampulla that will require evaluation with an upper GI endoscopy, again after improvement in her symptoms. I will follow.     The case has been discussed with Dr. Netty Starring. Further recommendations to follow.       ____________________________ Jill Side, MD si:bjt D: 04/28/2012 18:17:58 ET T: 04/29/2012 07:51:37 ET JOB#: [id number]  cc: Jill Side, MD, <Dictator> Jill Side MD ELECTRONICALLY SIGNED 05/11/2012 11:02

## 2014-12-18 NOTE — Consult Note (Signed)
PATIENT NAME:  KYM, SCANNELL MR#:  [id number] DATE OF BIRTH:  07-Apr-1932  DATE OF CONSULTATION:  03/26/2012  REFERRING PHYSICIAN:   CONSULTING PHYSICIAN:  Lupita Dawn. Franki Alcaide, MD  REASON FOR REFERRAL: Intractable nausea and vomiting.   DESCRIPTION: The patient is a 81 year old white female with history of right hip surgery on May 25EN that was complicated by hematoma, wound infection, and drainage. She had a Wound VAC placed and antibiotic treatment. Ever since then the patient has had issues with some nausea and vomiting which has progressively worsened. The patient also complains of anorexia, weight loss, and early satiety. The patient denied having any abdominal pain, nausea, or vomiting. There was some constipation but no diarrhea. There was no gross hematochezia or melena. The patient has been getting some Reglan and Zofran in the hospital which has helped.   The patient also has a history of reflux disease for which she has been on daily Prilosec for a long time. Prilosec helps with the reflux symptoms but provides no relief to the nausea.   There is no prior history of ulcer disease. She did have an upper endoscopy done but it was done many years ago.   PAST MEDICAL HISTORY:  1. Hyperlipidemia. 2. Reflux. 3. Hypertension. 4. Asthma.  5. Obesity.  6. Hypothyroidism.   PAST SURGICAL HISTORY:  1. Right hip surgery.  2. Hysterectomy.  3. Cholecystectomy. 4. Bladder surgery for cystitis.   ALLERGIES TO MEDICINES: Macrodantin, Zantac, amoxicillin, Erythromycin, quinolone, trimethoprim, sulfa, and Cipro.   MEDICATIONS:  1. Regular aspirin daily. 2. Amlodipine. 3. Inhalers and nasal spray. 4. Combination of hydrochlorothiazide and losartan. 5. Levothyroxine. 6. Prilosec daily. 7. Zofran as needed.  8. Advair inhalers. 9. Amlodipine.   FAMILY HISTORY: Notable for leukemia and heart disease.   SOCIAL HISTORY: She denies any alcohol or tobacco use.   REVIEW OF SYSTEMS: She has lost  approximately 30 pounds since the surgery. She does complain of some weakness. There is no chest pains or palpitations. There is no coughing or shortness of breath. The GI symptoms have been already described as mainly upper GI in nature. There are no visual or hearing changes.   PHYSICAL EXAMINATION:   VITAL SIGNS: The patient is afebrile. Her initial temperature was 97, pulse 89, respirations 20, blood pressure 152/66, oxygen saturations 100.   HEENT: Normocephalic, atraumatic head. Pupils are equally reactive. Throat was clear.   NECK: Supple.   CARDIAC: Regular rhythm and rate without murmurs.   LUNGS: Clear bilaterally.  ABDOMEN: Normoactive bowel sounds, soft. Even though she denied having any abdominal pain she was actually tender in the epigastrium. She had active bowel sounds. There is no hepatomegaly. There are no palpable masses.   EXTREMITIES: No clubbing, cyanosis, or edema.   NEUROLOGIC: Nonfocal.   SKIN: Essentially negative.   LABS ON ADMISSION: Sodium 126, potassium 3.4, chloride 88, CO2 26, BUN 11, creatinine 1.0, glucose 101. Lipase 100. Liver enzymes are normal. CPK enzymes and troponin levels are normal. White count 10.3, hemoglobin 11.6, platelet count 541. Urinalysis is negative.   ASSESSMENT AND PLAN: This is a patient with persistent nausea, vomiting, and early satiety with weight loss. She also has a history of reflux disease. It is possible that she could have ulcer disease despite being on Prilosec daily. She could have gastroparesis causing some of her symptoms as well. She is tender in the epigastrium. Lipase is normal. I would recommend that she stay on some daily PPI in the hospital. Would  plan on doing an upper endoscopy on Monday to find any sources of her symptoms in the upper GI tract.   Thank you for the referral.   ____________________________ Lupita Dawn. Candace Cruise, MD pyo:drc D: 03/27/2012 07:34:08 ET T: 03/27/2012 11:43:33 ET JOB#: [id number]  cc: Lupita Dawn.  Candace Cruise, MD, <Dictator> Lupita Dawn Renuka Farfan MD ELECTRONICALLY SIGNED 03/28/2012 13:53

## 2014-12-18 NOTE — Consult Note (Signed)
Chief Complaint:   Subjective/Chief Complaint Overall same. Still with diarrhea. No further vomiting.   VITAL SIGNS/ANCILLARY NOTES: **Vital Signs.:   30-Aug-13 05:28   Vital Signs Type Routine   Temperature Temperature (F) 99.2   Celsius 37.3   Temperature Source oral   Pulse Pulse 89   Respirations Respirations 18   Systolic BP Systolic BP 003   Diastolic BP (mmHg) Diastolic BP (mmHg) 71   Mean BP 97   Pulse Ox % Pulse Ox % 97   Pulse Ox Activity Level  At rest   Oxygen Delivery Room Air/ 21 %   Brief Assessment:   Additional Physical Exam Abdomen tender in both RL and LLQ. No rebound. Abdomen is overall soft.   Lab Results: Routine Chem:  30-Aug-13 04:49    Glucose, Serum 92   BUN  < 1   Creatinine (comp)  0.49   Sodium, Serum  132   Potassium, Serum  3.0   Chloride, Serum  97   CO2, Serum 27   Calcium (Total), Serum  6.8   Anion Gap 8   Osmolality (calc) SEE COMMENT   eGFR (African American) >60   eGFR (Non-African American) >60 (eGFR values <53m/min/1.73 m2 may be an indication of chronic kidney disease (CKD). Calculated eGFR is useful in patients with stable renal function. The eGFR calculation will not be reliable in acutely ill patients when serum creatinine is changing rapidly. It is not useful in  patients on dialysis. The eGFR calculation may not be applicable to patients at the low and high extremes of body sizes, pregnant women, and vegetarians.)   Result Comment CALCIUM - RESULTS VERIFIED BY REPEAT TESTING.  - NOTIFIED OF CRITICAL VALUE  - C/DEBBIE PINKERTON AT 0532 04/29/12  - READ-BACK PROCESS PERFORMED. BUN - RESULTS VERIFIED BY REPEAT TESTING. OSMOLALITY - UNABLE TO CALCULATE VALUE DUE TO NON-  - NUMERIC VALUE WITHIN THE CALCULATION.  Result(s) reported on 29 Apr 2012 at 05:40AM.  Routine Hem:  30-Aug-13 04:49    WBC (CBC) 8.1   RBC (CBC)  3.08   Hemoglobin (CBC)  8.9   Hematocrit (CBC)  26.0   Platelet Count (CBC)  465   MCV 84   MCH  28.9   MCHC 34.2   RDW  15.3   Neutrophil % 66.4   Lymphocyte % 22.4   Monocyte % 7.7   Eosinophil % 2.8   Basophil % 0.7   Neutrophil # 5.4   Lymphocyte # 1.8   Monocyte # 0.6   Eosinophil # 0.2   Basophil # 0.1 (Result(s) reported on 29 Apr 2012 at 05:40AM.)   Assessment/Plan:  Assessment/Plan:   Assessment Sigmoid colitis. Stool cultures pending. C. diff negative. UTI. Dilated bile duct most likely secondary to prior cholecystectomy. ? Ampullary lesion on CT.    Plan Continue flagyl. Agree with adding Augmentin. Continue clear liquids advance as tolerated. Further workup for suspected ampullary/billiary abnormality will be done after resolution of acute infection. If no clinical improvement in next 24-48 hours on current antibiotics, consider repeat CT.   Electronic Signatures: IJill Side(MD)  (Signed [id number])  Authored: Chief Complaint, VITAL SIGNS/ANCILLARY NOTES, Brief Assessment, Lab Results, Assessment/Plan   Last Updated: 30-Aug-13 08:45 by IJill Side(MD)

## 2014-12-18 NOTE — Consult Note (Signed)
PATIENT NAME:  Andrea Griffin, Andrea Griffin MR#:  [id number] DATE OF BIRTH:  01/05/1932  DATE OF CONSULTATION:  03/27/2012  REFERRING PHYSICIAN:  Dr. Caryl Comes  CONSULTING PHYSICIAN:  Maryan Char. Kennedy Bucker, MD  CHIEF COMPLAINT: Right hip chronic draining wound.   HISTORY OF PRESENT ILLNESS: This is a 81 year old female who was admitted on July 26th secondary to nausea, vomiting, and hyponatremia. She has a past history significant for right femoral neck fracture that was surgically corrected with a right hip hemiarthroplasty in May of this year. Following her hemiarthroplasty, she did develop complications of chronic infection with a chronically draining wound that she has been packing. The patient states that she has not experienced any increased drainage or pain in her right hip. She denies any fever or chills at home. She denies any trauma to the right hip. She states that the wound has been healing well and she has almost completed her regimen of packing. She was on prolonged antibiotics for an extended period of time secondary to the infection but has since completed her antibiotic regimen.   PAST MEDICAL HISTORY: 1. Hypothyroidism. 2. Hyperlipidemia. 3. Gastroesophageal reflux disease. 4. Asthma. 5. Hypertension. 6. Obesity. 7. Anemia. 8. Anxiety.  PAST SURGICAL HISTORY: 1. Hysterectomy. 2. Cholecystectomy. 3. Some form of bladder surgery for recurrent cystitis.   ALLERGIES: She has allergies to sulfa, Cipro, Macrodantin, Ranitidine, amoxicillin, erythromycin, quinolones, and trimethoprim.    CURRENT MEDICATIONS: 1. Advair. 2. Amlodipine. 3. Aspirin. 4. Atorvastatin. 5. Fluconazole.  6. Hydrochlorothiazide/Losartan. 7. Levothyroxine.  8. Nucynta. 9. Omeprazole. 10. Zofran. 11. Tylenol.  SOCIAL HISTORY: No tobacco, alcohol, or drug use. She is married.   REVIEW OF SYSTEMS: CARDIOVASCULAR: She denies chest pain. GI: She does have nausea and vomiting as described in the history of present  illness. MUSCULOSKELETAL: Per above regarding the right hip. RESPIRATORY: Denies shortness of breath, coughing, or wheezing.   PHYSICAL EXAMINATION:   VITAL SIGNS: Temperature 98.3, pulse 89, respirations 18, blood pressure 158/74, pulse oximetry 98%.  On examination of her right hip and lower extremity, there is a lateral wound along her right lateral proximal thigh. There is at the inferior aspect of the thigh a slight sinus with packing in it. There is no noted erythema or drainage. The packing was removed. The surrounding tissue was compressed to try to express any fluid. None was able to be expressed from the wound. The wound was approximately 1 to 2 cm deep. Examination of the right hip shows flexion to approximately 70 to 80 degrees with some mild thigh pain, abduction of 20 degrees, internal/external rotation of 10 degrees again all with mild thigh pain. No pain in her groin. Sensation intact to light touch. Throughout the right lower extremity her strength is 4 to 5 out of 5. She has positive planter flexion, dorsiflexion, EHL function of the right foot. She has capillary refill less than 2 seconds throughout the right lower extremity. No noted lymphadenopathy.   RADIOLOGICAL DATA: X-rays of the right hip from 03/25/2012 were compared to x-rays from July 4th. There is a right hip hemiarthroplasty noted with no evidence of hardware failure or significant change when compared to the x-rays from July 4th.   LABORATORY DATA: Of note, her white blood cell count was 10.3 on the 26th and was redrawn on the 27th and had dropped to 6.5. Her ESR done on the 27th remains elevated at 64. Her previous ESR from July 4th was at 48 as well.   ASSESSMENT: This is a 81 year old  female status post right hip hemiarthroplasty for right femoral neck fracture done in May by Dr. Rudene Christians who subsequently developed a wound infection, was irrigated and debrided as well as had a bedside debridement done, now with new onset  nausea and vomiting.  I don't think this is related to her right hip as her wound has significantly improved and there's no increased drainage or pain associated with the right hip. Suggest continued physical therapy. I will notify Dr. Rudene Christians of this development and the patient being admitted so he can further evaluate her. Should she develop any signs of recurrent infection in her right hip such as increase in drainage, increased pain in the right hip with ambulation or activity, erythema, or changes in her wound, we may have to re-evaluate her for further surgical intervention. At this time there's no surgical intervention needed.   ____________________________ Maryan Char Kennedy Bucker, MD rdc:drc D: 03/27/2012 12:12:29 ET T: 03/27/2012 12:44:54 ET JOB#: [id number]  cc: Maryan Char. Kennedy Bucker, MD, <Dictator> Laurey Arrow MD ELECTRONICALLY SIGNED 05/05/2012 17:42

## 2014-12-18 NOTE — Consult Note (Signed)
Chief Complaint:   Subjective/Chief Complaint Feels better. Only one small BM today. No vomiting.   VITAL SIGNS/ANCILLARY NOTES: **Vital Signs.:   03-Sep-13 06:03   Vital Signs Type Routine   Temperature Temperature (F) 98.7   Celsius 37   Temperature Source Oral   Pulse Pulse 97   Respirations Respirations 18   Systolic BP Systolic BP 122   Diastolic BP (mmHg) Diastolic BP (mmHg) 74   Mean BP 107   Pulse Ox % Pulse Ox % 100   Pulse Ox Activity Level  At rest   Oxygen Delivery Room Air/ 21 %   Brief Assessment:   Additional Physical Exam No significant abdominal tenderness.   Lab Results: Routine Chem:  03-Sep-13 04:46    Magnesium, Serum  1.3 (1.8-2.4 THERAPEUTIC RANGE: 4-7 mg/dL TOXIC: > 10 mg/dL  -----------------------)   Glucose, Serum 86   BUN  < 1   Creatinine (comp)  0.55   Sodium, Serum  132   Potassium, Serum 4.9   Chloride, Serum 101   CO2, Serum 25   Calcium (Total), Serum  7.8   Anion Gap  6   Osmolality (calc) SEE COMMENT   eGFR (African American) >60   eGFR (Non-African American) >60 (eGFR values <5mL/min/1.73 m2 may be an indication of chronic kidney disease (CKD). Calculated eGFR is useful in patients with stable renal function. The eGFR calculation will not be reliable in acutely ill patients when serum creatinine is changing rapidly. It is not useful in  patients on dialysis. The eGFR calculation may not be applicable to patients at the low and high extremes of body sizes, pregnant women, and vegetarians.)   Result Comment OSMO - UNABLE TO CALCULATE VALUE DUE TO NON-  - NUMERIC VALUE WITHIN THE CALCULATION.  - TPL  Result(s) reported on 03 May 2012 at 06:04AM.  Routine Hem:  03-Sep-13 04:46    WBC (CBC) 9.3   RBC (CBC)  3.24   Hemoglobin (CBC)  9.3   Hematocrit (CBC)  27.8   Platelet Count (CBC)  593   MCV 86   MCH 28.6   MCHC 33.3   RDW  15.8   Neutrophil % 71.7   Lymphocyte % 17.9   Monocyte % 8.4   Eosinophil % 1.6   Basophil  % 0.4   Neutrophil #  6.6   Lymphocyte # 1.7   Monocyte # 0.8   Eosinophil # 0.1   Basophil # 0.0 (Result(s) reported on 03 May 2012 at 06:04AM.)   Assessment/Plan:  Assessment/Plan:   Assessment Left sided colitis, clinically much better. No fever or leucocytosis.    Plan Will advance to full liquid, advance further if tolerated. Continue antibiotics for a total of 10-14 days. Probable home, from GI stand point, in 1-2 days.   Electronic Signatures: Jill Side (MD)  (Signed 03-Sep-13 12:44)  Authored: Chief Complaint, VITAL SIGNS/ANCILLARY NOTES, Brief Assessment, Lab Results, Assessment/Plan   Last Updated: 03-Sep-13 12:44 by Jill Side (MD)

## 2014-12-18 NOTE — Consult Note (Signed)
Chief Complaint:   Subjective/Chief Complaint feeling better after bm.  no nausea, much less lower abdominal discomfort.   VITAL SIGNS/ANCILLARY NOTES: **Vital Signs.:   02-Sep-13 06:01   Vital Signs Type Routine   Temperature Temperature (F) 96.5   Celsius 35.8   Temperature Source AdultAxillary   Pulse Pulse 93   Respirations Respirations 18   Systolic BP Systolic BP 557   Diastolic BP (mmHg) Diastolic BP (mmHg) 78   Mean BP 104   Pulse Ox % Pulse Ox % 97   Pulse Ox Activity Level  At rest   Oxygen Delivery Room Air/ 21 %    08:34   Temperature Temperature (F) 98   Celsius 36.6   Temperature Source axillary  *Intake and Output.:   02-Sep-13 00:25   Stool  small soft stool    01:03   Stool  Very, very small amount of loose stool (BSC).    02:53   Stool  Very, very small amount of loose stool mixed with urine (BSC).    04:34   Stool  Small amount of semi-formed stool (BSC).    07:44   Stool  had large normal bm    09:14   Stool  had extra large runny loose normal color bm    10:15   Stool  tiny bm normal color runny loose    11:06   Stool  had small runny loose bm normal color   Brief Assessment:   Cardiac Regular    Respiratory clear BS    Gastrointestinal details normal Soft  Nondistended  No masses palpable  Bowel sounds normal  No rebound tenderness  mild tenderness in the lower abdomen, some in luq, much improved over the past couple days.   Lab Results: Routine Chem:  02-Sep-13 05:02    Magnesium, Serum  1.5 (1.8-2.4 THERAPEUTIC RANGE: 4-7 mg/dL TOXIC: > 10 mg/dL  -----------------------)   Glucose, Serum 91   BUN  1   Creatinine (comp)  0.49   Sodium, Serum  130   Potassium, Serum 4.8   Chloride, Serum 101   CO2, Serum 23   Calcium (Total), Serum  7.2   Anion Gap  6   Osmolality (calc) 256   eGFR (African American) >60   eGFR (Non-African American) >60 (eGFR values <61mL/min/1.73 m2 may be an indication of chronic kidney disease  (CKD). Calculated eGFR is useful in patients with stable renal function. The eGFR calculation will not be reliable in acutely ill patients when serum creatinine is changing rapidly. It is not useful in  patients on dialysis. The eGFR calculation may not be applicable to patients at the low and high extremes of body sizes, pregnant women, and vegetarians.)  Routine Hem:  02-Sep-13 05:02    WBC (CBC) 9.7   RBC (CBC)  3.29   Hemoglobin (CBC)  9.6   Hematocrit (CBC)  28.4   Platelet Count (CBC)  602   MCV 86   MCH 29.0   MCHC 33.7   RDW  15.5   Neutrophil % 77.1   Lymphocyte % 14.3   Monocyte % 6.9   Eosinophil % 1.2   Basophil % 0.5   Neutrophil #  7.5   Lymphocyte # 1.4   Monocyte # 0.7   Eosinophil # 0.1   Basophil # 0.0 (Result(s) reported on 02 May 2012 at 05:59AM.)   Assessment/Plan:  Assessment/Plan:   Assessment 1) abdominal pain improving.  repeat ct still showing left sided colitis. 2) no  apparent ampullary lesion on second ct.    Plan 1) continue current.  Will likely need to have colonoscopy via Dr Dionne Milo.  He will be back tomorrow.   Electronic Signatures: Loistine Simas (MD)  (Signed 02-Sep-13 12:06)  Authored: Chief Complaint, VITAL SIGNS/ANCILLARY NOTES, Brief Assessment, Lab Results, Assessment/Plan   Last Updated: 02-Sep-13 12:06 by Loistine Simas (MD)

## 2014-12-18 NOTE — Consult Note (Signed)
Chief Complaint:   Subjective/Chief Complaint mild nausea, no emesis, tolerating clears.  continues with some abdominal pain, low pubic and medial luq. several loose stools this am.   VITAL SIGNS/ANCILLARY NOTES: **Vital Signs.:   31-Aug-13 05:22   Vital Signs Type Routine   Temperature Temperature (F) 98.3   Celsius 36.8   Temperature Source Oral   Pulse Pulse 89   Respirations Respirations 18   Systolic BP Systolic BP 578   Diastolic BP (mmHg) Diastolic BP (mmHg) 77   Mean BP 99   Pulse Ox % Pulse Ox % 96   Pulse Ox Activity Level  At rest   Oxygen Delivery Room Air/ 21 %  *Intake and Output.:   31-Aug-13 02:05   Stool  small pieces    10:26   Stool  loose stool    11:49   Stool  Pt had a small amt of loose stool    13:07   Stool  loose   Brief Assessment:   Cardiac Regular    Respiratory clear BS    Gastrointestinal details normal Soft  Nondistended  Bowel sounds normal  No rebound tenderness  mild tenderness luq/epigastrum, suprapubic   Lab Results: Thyroid:  31-Aug-13 06:13    Thyroid Stimulating Hormone  0.024 (0.45-4.50 (International Unit)  ----------------------- Pregnant patients have  different reference  ranges for TSH:  - - - - - - - - - -  Pregnant, first trimetser:  0.36 - 2.50 uIU/mL)  Hepatic:  31-Aug-13 06:13    Bilirubin, Total 0.4   Alkaline Phosphatase  146   SGPT (ALT) 16   SGOT (AST) 25   Total Protein, Serum 6.5   Albumin, Serum  2.2  Routine Chem:  31-Aug-13 06:13    Glucose, Serum  100   BUN  1   Creatinine (comp)  0.50   Sodium, Serum  133   Potassium, Serum 3.8   Chloride, Serum 100   CO2, Serum 25   Calcium (Total), Serum  6.9   Osmolality (calc) 262   eGFR (African American) >60   eGFR (Non-African American) >60 (eGFR values <44mL/min/1.73 m2 may be an indication of chronic kidney disease (CKD). Calculated eGFR is useful in patients with stable renal function. The eGFR calculation will not be reliable in acutely ill  patients when serum creatinine is changing rapidly. It is not useful in  patients on dialysis. The eGFR calculation may not be applicable to patients at the low and high extremes of body sizes, pregnant women, and vegetarians.)   Result Comment CALCIUM - RESULTS VERIFIED BY REPEAT TESTING.  - NOTIFIED OF CRITICAL VALUE  - C/VERN IONGEX [id number] 04/30/12 KMR  - READ-BACK PROCESS PERFORMED.  Result(s) reported on 30 Apr 2012 at 07:10AM.   Anion Gap 8   Magnesium, Serum  0.6 (1.8-2.4 THERAPEUTIC RANGE: 4-7 mg/dL TOXIC: > 10 mg/dL  -----------------------)  Routine Hem:  31-Aug-13 06:13    WBC (CBC) 7.6   RBC (CBC)  3.12   Hemoglobin (CBC)  9.1   Hematocrit (CBC)  26.4   Platelet Count (CBC)  494   MCV 85   MCH 29.0   MCHC 34.3   RDW  15.5   Neutrophil % 66.9   Lymphocyte % 22.0   Monocyte % 8.0   Eosinophil % 2.3   Basophil % 0.8   Neutrophil # 5.1   Lymphocyte # 1.7   Monocyte # 0.6   Eosinophil # 0.2   Basophil # 0.1 (Result(s) reported on  30 Apr 2012 at 07:03AM.)   Assessment/Plan:  Assessment/Plan:   Assessment 1) n/v abdominal pain, slow improvement. probable gastroenteritis, possible finding of colitis on ct 2) abnormal ct with mild bile cuct dilation and possible ampullary lesion    Plan 1) consider repeat CT abd with thin cut pancreatic protocol to better evaluate the region of the ampulla.  may need further evaluation via egd/ercp if abnormal. 2) last colonoscopy 8/08 with finding of diverticulosis.  consider repeat colnoscopy depending on clinical progress.  Dr Dionne Milo to return on monday.   Electronic Signatures: Loistine Simas (MD)  (Signed 31-Aug-13 14:42)  Authored: Chief Complaint, VITAL SIGNS/ANCILLARY NOTES, Brief Assessment, Lab Results, Assessment/Plan   Last Updated: 31-Aug-13 14:42 by Loistine Simas (MD)

## 2014-12-18 NOTE — H&P (Signed)
PATIENT NAME:  Andrea Griffin, Andrea Griffin MR#:  [id number] DATE OF BIRTH:  05-09-1932  DATE OF ADMISSION:  04/26/2012  PRIMARY CARE PHYSICIAN: Dion Body, MD  CHIEF COMPLAINT: Nausea, vomiting, diarrhea, and abdominal discomfort.   HISTORY OF PRESENT ILLNESS: This is a 81 year old female with complex medical history who presented to the office today with complaints of vomiting, nausea, diarrhea, and weakness for several days. She states she is having difficulty keeping down fluids and she is continuing to have incontinence of her bowels which is liquid stools. According to her husband, he does not think he will be able to take care of her at home and wants her admitted for treatment and evaluation. The patient denies any fever, denies any recent foreign travel, abnormal water source, and no recent antibiotics. She was hospitalized recently for similar symptoms of intractable vomiting, in the month of July, which did resolve. She was seen by Dr. Candace Cruise at that time and underwent an EGD which was negative and showed a normal esophagus and stomach. Since that time, she has been on a low residue diet and a PPI which has helped.   PAST MEDICAL HISTORY:  1. Hypothyroidism. 2. Hyperlipidemia.  3. Gastroesophageal reflux disease.  4. Asthma.  5. Hypertension.  6. Obesity.  7. Anxiety.   PAST SURGICAL HISTORY:  1. Hysterectomy.  2. Bladder stem dilation for recurrent cystitis many years ago.  3. Cholecystectomy.   HOME MEDICATIONS: 1. Levothyroxine 50 mcg p.o. daily.  2. Flonase 50 mcg two sprays to each nostril daily.  3. Fexofenadine 180 mg p.o. daily. 4. Calcium and vitamin D 600/400 mg one tab daily.  5. Lipitor 20 mg p.o. at bedtime.  6. Losartan 100 mg p.o. daily.  7. Advair 100/50 one puff twice a day. 8. Amlodipine 10 mg p.o. daily.  9. Zofran 4 mg one tab p.o. every four hours p.r.n. for nausea and vomiting.  10. Tylenol 325 mg two tabs p.o. every four hours p.r.n. for pain.   ALLERGIES:  Sulfa, Cipro, Macrodantin, and amoxicillin.   FAMILY HISTORY: Father is deceased at age 32 with a history of myocardial infarction x5 with leukemia. Mother is deceased at age 58. History of transient ischemic attacks in some of her siblings. One brother is deceased with history of myocardial infarction. She has one sister who is still living.   SOCIAL HISTORY: She is married and lives with her husband. She is retired. No tobacco, alcohol, or drug use.  She uses supplemental vitamin D and calcium.   PERTINENT LABS: CBC, C-MET, and lipase are pending at [facility].  ASSESSMENT AND PLAN: This is a 81 year old female being admitted for observation with diagnosis of gastroenteritis with dehydration.  1. Gastroenteritis with dehydration, new acute onset, likely viral. Plan to treat with supportive care with IV fluids. We will place her on clear diet. Control nausea and vomiting with Zofran and Phenergan as needed. If symptoms do not improve, we will need to get gastroenterology involved. We will culture and test her stools for C. difficile and also for parasites and other bacteria.  2. Hypothyroidism. She remains stable. We will continue on her home dose of levothyroxine 50 mcg daily.  3. Hyperlipidemia. We will continue with her home regimen of Lipitor.  4. Hypertension. Given her low blood pressure, we will hold her blood pressure medications which include losartan and amlodipine at this time and would likely need to add that back once her blood pressure comes up.  5. Asthma. We will  continue with her home regimen.  6. Reflux disease. We will place her on pantoprazole during her hospital stay.      7. Anxiety. She is off of medications. We will not add anything at this time.  8. Disposition. She is going to be admitted to the medical floor for observation under the care of Dr. Netty Starring.  ____________________________ Dion Body, MD kl:slb D: 04/26/2012 14:22:00 ET T: 04/26/2012  14:37:12 ET JOB#: [id number]  cc: Dion Body, MD, <Dictator> Dion Body MD ELECTRONICALLY SIGNED 05/04/2012 11:06

## 2014-12-18 NOTE — Consult Note (Signed)
Chief Complaint:   Subjective/Chief Complaint Still nauseous. Nausea med does help.   VITAL SIGNS/ANCILLARY NOTES: **Vital Signs.:   28-Jul-13 06:36   Temperature Temperature (F) 98.3   Celsius 36.8   Temperature Source Oral   Pulse Pulse 89   Respirations Respirations 18   Systolic BP Systolic BP 060   Diastolic BP (mmHg) Diastolic BP (mmHg) 74   Mean BP 102   Pulse Ox % Pulse Ox % 98   Brief Assessment:   Cardiac Regular    Respiratory clear BS    Gastrointestinal Epig tenderness   Lab Results: Routine Chem:  28-Jul-13 06:00    Glucose, Serum 88   BUN 8   Creatinine (comp) 0.89   Sodium, Serum  135   Potassium, Serum  3.4   Chloride, Serum 98   CO2, Serum 28   Calcium (Total), Serum 8.6   Anion Gap 9   Osmolality (calc) 268   eGFR (African American) >60   eGFR (Non-African American) >60 (eGFR values <37mL/min/1.73 m2 may be an indication of chronic kidney disease (CKD). Calculated eGFR is useful in patients with stable renal function. The eGFR calculation will not be reliable in acutely ill patients when serum creatinine is changing rapidly. It is not useful in  patients on dialysis. The eGFR calculation may not be applicable to patients at the low and high extremes of body sizes, pregnant women, and vegetarians.)   Assessment/Plan:  Assessment/Plan:   Assessment Nausea/vomiting. PUD? Gastroparesis?    Plan NPO after MN for EGD in AM. Thanks.   Electronic Signatures: Verdie Shire (MD)  (Signed 28-Jul-13 09:29)  Authored: Chief Complaint, VITAL SIGNS/ANCILLARY NOTES, Brief Assessment, Lab Results, Assessment/Plan   Last Updated: 28-Jul-13 09:29 by Verdie Shire (MD)

## 2014-12-18 NOTE — Consult Note (Signed)
Pt seen and examined. Full consult to follow. Progressively worsening nausea/vomiting, anorexia, wt loss over few months, esp since  hip surgery. No heartbun with prilosec. or hx of PUD. Early satiety. Tender epigastrum. Nausea no better with prilosec.  Recommend PPI and plan EGD on Monday. Will follow. Thanks  Electronic Signatures: Verdie Shire (MD)  (Signed on 27-Jul-13 14:59)  Authored  Last Updated: 27-Jul-13 14:59 by Verdie Shire (MD)

## 2014-12-18 NOTE — Discharge Summary (Signed)
PATIENT NAME:  Andrea Griffin, Andrea Griffin MR#:  [id number] DATE OF BIRTH:  30-Oct-1931  DATE OF ADMISSION:  04/28/2012 DATE OF DISCHARGE:  05/05/2012  DISCHARGE DIAGNOSES:  1. Gastroenteritis/colitis.  2. Hypertension.  3. Hyponatremia.  4. Anemia.  5. Hypothyroidism.  6. Hyperlipidemia.  7. Gastroesophageal reflux disease.  8. Asthma.  9. Anxiety.   DISCHARGE MEDICATIONS:  1. Zofran 4-mg disintegrating tablet every four hours as needed for nausea and vomiting.  2. Tylenol 650 p.o. every four hours p.r.n. for pain.  3. Omeprazole 20 mg p.o. daily over-the-counter.  4. Fluticasone 50 mcg two sprays each nostril daily.  5. Levothyroxine 50 mcg p.o. daily.  6. Advair 100/50 1 puff b.i.d.  7. Atorvastatin 20 mg p.o. at bedtime.  8. Amlodipine 10 mg p.o. daily.  9. Fexofenadine 180 mg p.o. daily.  10. Losartan 100 mg p.o. daily.  11. Augmentin 875/125, 1 tab p.o. b.i.d. times 3 more days.  12. Flagyl 500 mg p.o. t.i.d. times 3 more days.   CONSULTS: GI per Dr. Dionne Milo.   PROCEDURES: None.   PERTINENT LABS AND STUDIES: The patient had a CT of the abdomen that was consistent with colitis prior to discharge. Sodium 130, potassium 4.1, creatinine 0.6, white blood cell count 7.4, hemoglobin 9.5, and platelets 601.  BRIEF HOSPITAL COURSE:  1. Gastroenteritis/colitis. The patient came in with complaints of nausea, vomiting, and diarrhea with abdominal discomfort. She was placed on IV fluids and was placed on clear liquids. Gastroenterology was consulted as her abdominal pain continued. CT of the abdomen did show colitis. She was placed on Flagyl and also added Augmentin. Her symptoms improved thereafter. She remained afebrile. White blood cell count within normal limits. She was able to progress her diet and she is stable at this point for discharge.  2. Other chronic medical issues remained stable. We will continue with the home regimen as an outpatient.   DISPOSITION:  She is in stable condition to  be discharged to home.   FOLLOWUP: Follow up with Dr. Netty Starring 1 to 2 weeks.    ____________________________ Dion Body, MD kl:bjt D: 05/05/2012 10:51:54 ET T: 05/06/2012 12:02:50 ET JOB#: [id number]  cc: Dion Body, MD, <Dictator> Dion Body MD ELECTRONICALLY SIGNED 05/06/2012 14:04

## 2014-12-18 NOTE — Consult Note (Signed)
Chief Complaint:   Subjective/Chief Complaint continues to complain of abdominal pain, mostly suprapubic. some nausea no emesis.   VITAL SIGNS/ANCILLARY NOTES: **Vital Signs.:   01-Sep-13 05:35   Vital Signs Type Routine   Temperature Temperature (F) 98.5   Celsius 36.9   Temperature Source Oral   Pulse Pulse 87   Respirations Respirations 18   Systolic BP Systolic BP 546   Diastolic BP (mmHg) Diastolic BP (mmHg) 76   Mean BP 104   Pulse Ox % Pulse Ox % 97   Pulse Ox Activity Level  At rest   Oxygen Delivery Room Air/ 21 %  *Intake and Output.:   01-Sep-13 01:24   Stool  small amount of loose stool    02:59   Stool  very small amount of stool    03:29   Stool  very small amount of stool    07:58   Stool  small brown formed stool    09:39   Stool  medium soft brown stool .    10:11   Stool  with small forms of loose brown stool .   Brief Assessment:   Cardiac Regular    Respiratory clear BS    Gastrointestinal details normal Soft  Nondistended  Bowel sounds normal  No rebound tenderness  moderate lower abdominal apin to palpation, miler epigastric discomfort.   Lab Results: Routine Chem:  01-Sep-13 04:18    Magnesium, Serum  1.0 (1.8-2.4 THERAPEUTIC RANGE: 4-7 mg/dL TOXIC: > 10 mg/dL  -----------------------)   Glucose, Serum 92   BUN  < 1   Creatinine (comp)  0.49   Sodium, Serum  133   Potassium, Serum 4.3   Chloride, Serum 101   CO2, Serum 23   Calcium (Total), Serum  7.2   Anion Gap 9   Osmolality (calc) SEE COMMENT   eGFR (African American) >60   eGFR (Non-African American) >60 (eGFR values <54mL/min/1.73 m2 may be an indication of chronic kidney disease (CKD). Calculated eGFR is useful in patients with stable renal function. The eGFR calculation will not be reliable in acutely ill patients when serum creatinine is changing rapidly. It is not useful in  patients on dialysis. The eGFR calculation may not be applicable to patients at the low and high  extremes of body sizes, pregnant women, and vegetarians.)   Result Comment OSMOLALITY - UNABLE TO CALCULATE VALUE DUE TO NON-  - NUMERIC VALUE WITHIN THE CALCULATION. BUN - RESULTS VERIFIED BY REPEAT TESTING.  Result(s) reported on 01 May 2012 at 05:37AM.  Routine Hem:  01-Sep-13 04:18    WBC (CBC) 7.6   RBC (CBC)  3.19   Hemoglobin (CBC)  9.2   Hematocrit (CBC)  27.1   Platelet Count (CBC)  547   MCV 85   MCH 28.9   MCHC 34.0   RDW  15.6   Neutrophil % 64.6   Lymphocyte % 22.9   Monocyte % 9.2   Eosinophil % 2.6   Basophil % 0.7   Neutrophil # 4.9   Lymphocyte # 1.7   Monocyte # 0.7   Eosinophil # 0.2   Basophil # 0.1 (Result(s) reported on 01 May 2012 at 05:37AM.)   Assessment/Plan:  Assessment/Plan:   Assessment 1) abdominal pain and nausea. minimal improvement.  abnormal ct showing some concern for colitis.  bm relatively normal by report.patient on abx.  2) abnormal ct, concern for possible ampullary lesion.  3) anxiety 4) hyperthyroid    Plan 1) to have repeat  ct ab today. may need to have colonoscopy depending on result.    Dr Dionne Milo to be back tomorrow.   Electronic Signatures: Loistine Simas (MD)  (Signed 01-Sep-13 11:42)  Authored: Chief Complaint, VITAL SIGNS/ANCILLARY NOTES, Brief Assessment, Lab Results, Assessment/Plan   Last Updated: 01-Sep-13 11:42 by Loistine Simas (MD)

## 2014-12-18 NOTE — Discharge Summary (Signed)
PATIENT NAME:  Andrea Griffin, Andrea Griffin MR#:  [id number] DATE OF BIRTH:  September 06, 1931  DATE OF ADMISSION:  03/25/2012 DATE OF DISCHARGE:  03/29/2012  DISCHARGE DIAGNOSES:  1. Nausea and vomiting, resolved. 2. Hyponatremia, resolved.  3. Hypothyroidism.  4. Hyperlipidemia.  5. Gastroesophageal reflux disease.  6. Asthma.  7. Hypertension.  8. Anxiety.   DISCHARGE MEDICATIONS:  1. Zofran 4 mg ODT dissolve in mouth every 6 hours p.r.n. for nausea and vomiting.  2. Tylenol 325 mg 2 tabs every four hours p.r.n. for pain.  3. Omeprazole 20 mg p.o. daily.  4. Fluticasone 50 mcg two sprays to each nostril daily.  5. Levothyroxine 50 mcg p.o. daily.  6. Advair 100/50 one puff twice a day. 7. Atorvastatin 20 mg p.o. daily.  8. Amlodipine 10 mg p.o. daily.  9. Allegra 180 mg p.o. daily.  10. Losartan 100 mg p.o. daily.   MEDICATIONS TO HOLD: Nucynta and aspirin.   CONSULTANTS:   1. Verdie Shire, MD - Gastroenterology.  2. Orthopedics.   PROCEDURES: The patient underwent an EGD on 03/28/2012 that was normal. Biopsies were obtained and they are still pending at this time.  PERTINENT LABS ON DAY OF DISCHARGE: Sodium 136, potassium 3.8, and creatinine 0.74. White blood cell count 7.2, hemoglobin 11, and platelets 469.   DIET: The patient is supposed to be on a low residue diet.   BRIEF HOSPITAL COURSE: Nausea and vomiting. The patient initially came in complaining of uncontrolled nausea and vomiting with hyponatremia. She was evaluated by Dr. Candace Cruise and the patient underwent an EGD which was negative and showed a normal esophagus and stomach. He recommended a low residue diet and PPI daily. The patient was feeling better, tolerated her breakfast and was discharged home on 03/29/2012. Hyponatremia did resolve with IV fluids and oral intake and with resolution of nausea and vomiting. Her other chronic medical issues remained stable. She will be discharged to home. She has outpatient PT already scheduled. Followup  with Dr. Netty Starring as scheduled. Followup with Dr. Candace Cruise in two weeks if symptoms return.  ____________________________ Dion Body, MD kl:[id number] D: 03/29/2012 12:22:31 ET T: 03/30/2012 10:37:03 ET JOB#: [id number]  cc: Dion Body, MD, <Dictator> Lupita Dawn. Candace Cruise, MD  Dion Body MD ELECTRONICALLY SIGNED 03/31/2012 8:31

## 2014-12-18 NOTE — Consult Note (Signed)
Nausea better today. EGD was completely normal. GE junction and gastric bx taken. Low residue diet. If tolerated, patient can be discharged on daily PPI and nausea meds. Pt then can f/u with [place] later. If nausea worsens, then order gastric emptying scan either as inpt or as outpt. THanks.  Electronic Signatures: Verdie Shire (MD)  (Signed on 29-Jul-13 10:42)  Authored  Last Updated: 29-Jul-13 10:42 by Verdie Shire (MD)

## 2014-12-18 NOTE — Consult Note (Signed)
Chief Complaint:   Subjective/Chief Complaint Feels better. Both diarrhea and abdominal pain are significantly improved.   VITAL SIGNS/ANCILLARY NOTES: **Vital Signs.:   04-Sep-13 15:00   Vital Signs Type Routine   Temperature Temperature (F) 97.9   Celsius 36.6   Temperature Source Oral   Pulse Pulse 86   Respirations Respirations 18   Systolic BP Systolic BP 161   Diastolic BP (mmHg) Diastolic BP (mmHg) 65   Mean BP 88   Pulse Ox % Pulse Ox % 98   Pulse Ox Activity Level  At rest   Oxygen Delivery Room Air/ 21 %   Brief Assessment:   Additional Physical Exam Abdomen is soft and non tender.   Lab Results: Routine Chem:  04-Sep-13 05:28    Glucose, Serum 92   BUN  < 1   Creatinine (comp) 0.60   Sodium, Serum  130   Potassium, Serum 4.1   Chloride, Serum 98   CO2, Serum 27   Calcium (Total), Serum  8.2   Anion Gap  5   Osmolality (calc) SEE COMMENT   eGFR (African American) >60   eGFR (Non-African American) >60 (eGFR values <69mL/min/1.73 m2 may be an indication of chronic kidney disease (CKD). Calculated eGFR is useful in patients with stable renal function. The eGFR calculation will not be reliable in acutely ill patients when serum creatinine is changing rapidly. It is not useful in  patients on dialysis. The eGFR calculation may not be applicable to patients at the low and high extremes of body sizes, pregnant women, and vegetarians.)   Result Comment OSMOLARITY - UNABLE TO CALCULATE VALUE DUE TO NON-  - NUMERIC VALUE WITHIN THE CALCULATION.  Result(s) reported on 04 May 2012 at 06:04AM.  Routine Hem:  04-Sep-13 05:28    WBC (CBC) 7.4   RBC (CBC)  3.29   Hemoglobin (CBC)  9.5   Hematocrit (CBC)  27.8   Platelet Count (CBC)  601   MCV 85   MCH 29.0   MCHC 34.2   RDW  15.8   Neutrophil % 65.9   Lymphocyte % 22.6   Monocyte % 9.7   Eosinophil % 1.3   Basophil % 0.5   Neutrophil # 4.9   Lymphocyte # 1.7   Monocyte # 0.7   Eosinophil # 0.1   Basophil  # 0.0 (Result(s) reported on 04 May 2012 at 06:09AM.)   Assessment/Plan:  Assessment/Plan:   Assessment Colitis, clinically much better on antibiotics.    Plan Will advance to soft diet. Probable DC home tomorrow on antibiotics. Will sign off. Please reconsult on call GI if needed. Thanks.   Electronic Signatures: Jill Side (MD)  (Signed 04-Sep-13 19:37)  Authored: Chief Complaint, VITAL SIGNS/ANCILLARY NOTES, Brief Assessment, Lab Results, Assessment/Plan   Last Updated: 04-Sep-13 19:37 by Jill Side (MD)

## 2014-12-18 NOTE — Consult Note (Signed)
Brief Consult Note: Diagnosis: Colitis.   Patient was seen by consultant.   Orders entered.   Comments: Nausea, vomiting and diarrhea. ? gastroentritis. CT showed sigmoid colitis. Positive leucocytosis. Biliary distension likely from prior cholecystectomy. ? Ampullary mass.  Recommendations: Will start IV flagyl. Repeat CBC in am. Patient may need broader antibiotic coverage. Numerous antibiotics have been listed as unsuitable for the patient although she denies any knowledge of being allergic to PCN and Augmentin would be a reasonable choice. LFT's in am. If abnormal further workp will be done for suspected biliary obstruction. Patient will likely need an EGD as well for suspected ampllary lesion. Will follow.  Electronic Signatures: Jill Side (MD)  (Signed 28-Aug-13 18:49)  Authored: Brief Consult Note   Last Updated: 28-Aug-13 18:49 by Jill Side (MD)

## 2014-12-18 NOTE — Consult Note (Signed)
Chief Complaint:   Subjective/Chief Complaint No nausea and vomiting. Still with diarrhea although somewhat better.   VITAL SIGNS/ANCILLARY NOTES: **Vital Signs.:   29-Aug-13 13:31   Vital Signs Type Routine   Temperature Temperature (F) 98.7   Celsius 37   Temperature Source Oral   Pulse Pulse 90   Respirations Respirations 18   Systolic BP Systolic BP 536   Diastolic BP (mmHg) Diastolic BP (mmHg) 69   Mean BP 89   Pulse Ox % Pulse Ox % 96   Pulse Ox Activity Level  At rest   Oxygen Delivery Room Air/ 21 %   Brief Assessment:   Additional Physical Exam Abdomen with mild diffuse tenderness. No rebound or guarding.   Lab Results: Hepatic:  29-Aug-13 05:25    Bilirubin, Total 0.5   Bilirubin, Direct 0.10 (Result(s) reported on 28 Apr 2012 at [facility]   Alkaline Phosphatase  158   SGPT (ALT) 15   SGOT (AST) 25   Total Protein, Serum  6.3   Albumin, Serum  2.0  Routine Chem:  29-Aug-13 05:25    Glucose, Serum  102   BUN  3   Creatinine (comp)  0.47   Sodium, Serum  131   Potassium, Serum  2.8   Chloride, Serum  95   CO2, Serum 26   Calcium (Total), Serum  6.9   Anion Gap 10   Osmolality (calc) 259   eGFR (African American) >60   eGFR (Non-African American) >60 (eGFR values <28mL/min/1.73 m2 may be an indication of chronic kidney disease (CKD). Calculated eGFR is useful in patients with stable renal function. The eGFR calculation will not be reliable in acutely ill patients when serum creatinine is changing rapidly. It is not useful in  patients on dialysis. The eGFR calculation may not be applicable to patients at the low and high extremes of body sizes, pregnant women, and vegetarians.)   Result Comment calcium - RESULTS VERIFIED BY REPEAT TESTING.  - NOTIFIED OF CRITICAL VALUE  - mpg c/ debbie pinkerton @ 1443 04/28/12  - READ-BACK PROCESS PERFORMED.  Result(s) reported on 28 Apr 2012 at [facility].  Routine Hem:  29-Aug-13 05:25    WBC (CBC)  11.9   RBC  (CBC)  3.06   Hemoglobin (CBC)  8.7   Hematocrit (CBC)  25.7   Platelet Count (CBC)  484   MCV 84   MCH 28.5   MCHC 33.9   RDW  15.2   Neutrophil % 71.1   Lymphocyte % 19.8   Monocyte % 7.8   Eosinophil % 0.8   Basophil % 0.5   Neutrophil #  8.4   Lymphocyte # 2.4   Monocyte # 0.9   Eosinophil # 0.1   Basophil # 0.1 (Result(s) reported on 28 Apr 2012 at [facility].)   Assessment/Plan:  Assessment/Plan:   Assessment Colitis, viral vs bacterial. Somewhat better clinically. Stool c/s pending. UTI. May be responsible for some of her symptoms such as lower abdominal pain and nausea and vomiting. Biliary dilation on CT. LFT's are normal and biliary obstruction is unlikely. Dilated duscts are most likely secondary to prior cholecystectomy. ? Ampullary lesion on CT.    Plan Continue Flagyl. May advance diet if tolerated. I would eb inclined to start her on Augmentin both for the treatment of UTI as well as colitis although will wait for urine sensitivity report. EGD later. Will follow.   Electronic Signatures: Jill Side (MD)  (Signed 29-Aug-13 18:22)  Authored: Chief Complaint, VITAL  SIGNS/ANCILLARY NOTES, Brief Assessment, Lab Results, Assessment/Plan   Last Updated: 29-Aug-13 18:22 by Jill Side (MD)

## 2014-12-21 NOTE — Discharge Summary (Signed)
PATIENT NAME:  Andrea Griffin, Andrea Griffin MR#:  [id number] DATE OF BIRTH:  07-25-1932  DATE OF ADMISSION:  09/14/2012 DATE OF DISCHARGE:  09/20/2012  ADMITTING DIAGNOSIS: Right septic hip, status post hemiarthroplasty.   DISCHARGE DIAGNOSIS: Right septic hip with hemiarthroplasty in place.   PROCEDURE PERFORMED: Irrigation and debridement of right hip.   ANESTHESIA: General.   SURGEON: Dr. Laurene Footman.   ESTIMATED BLOOD LOSS: 200 mL.    COMPLICATIONS: There were no complications.   SPECIMENS: No specimen as there had been previous culture.   CONDITION: To recovery room stable with abduction pillow in place.   HISTORY: The patient is a 81 year old female who suffered a right hip femoral neck fracture on 01/20/2012. She underwent a right hip hemiarthroplasty, subsequently developed a hematoma in June and had an I and D with a wound VAC 03/03/2012. She was seen in the office on 09/12/2012 and was noted to have a decreased joint space and pain in the right hip. Sed rate and C-reactive protein were elevated and yesterday she had a hip aspiration that showed many white blood cells and gram-positive cocci. She is being admitted for treatment of presumed staph infection of the right hip. She has been having some groin pain as well as walking with a cane at times.   PHYSICAL EXAMINATION: GENERAL: Well developed, well nourished female, appears her stated age, in no acute distress.  HEENT: Unremarkable. She has fairly good range of motion of the neck.  LUNGS: Clear to auscultation.  HEART: Regular rate and rhythm.  LOWER EXTREMITIES: She has some pain with hip rotation that is mild. She has a healed right hip scar with a scar down to the deep tissue. No drainage at present. She has palpable dorsalis pedis and posterior tibialis pulses. She is able to flex and extend the toes, and muscle function is intact.   HOSPITAL COURSE: The patient was admitted to the hospital on 09/14/2012. Consultation was put in  for infectious disease. On 09/15/2012, she had incision and drainage performed as well as a PICC line placed. Previous culture showed gram-positive cocci so IV vancomycin was started. At the end of surgery, a wound VAC was placed. The patient was brought to the orthopedic floor from the PACU in stable condition. On postoperative day 1, the patient's urine culture was noted to show some infection and grew Klebsiella. The patient was started on Rocephin for the pyuria. Infectious disease noted the patient would need IV vanc for at least 6 weeks and the plan would be to discharge her on a vanc goal trough between 15 and 20. ID also noted she would need a weekly CBC as well as C-MET and ESR. On postoperative day 2, she had a drop in her hemoglobin to 7.7. She continued with the wound VAC but had no drainage from the wound VAC. She did progress well with physical therapy and had noticed much improvement in pain since the incision and drainage. On postoperative day 3, the patient's hemoglobin remained stable at 7.7. She did develop a mild hypokalemia which was 3.3. We treated the hypokalemia with potassium orally. On postoperative day 4, she had elevation of the hemoglobin to 9.2. Repeat urine cultures were negative for pyuria. She was noted to have increase in TSH so levothyroxine was decreased to 25 mcg daily. On postoperative day 5, the patient was stable, hemoglobin remained stable at 9.2. Vital signs were stable and she had progressed very well with physical therapy. She was ready for discharge  home with home health PT as well as home health nursing for her IV vancomycin twice a day for 6 weeks. The patient will have a followup appointment in 5 weeks with Dr. Ola Spurr as well as a followup  appointment with Dr. Rudene Christians with [facility] in 2 weeks for staple removal. She will also receive home physical therapy.   DISCHARGE INSTRUCTIONS: She may gradually increase weight-bearing on the affected extremity as  tolerated. She is to elevate the affected foot or leg on 1 or 2 pillows with the foot higher than the knee. She needs to wear thigh-high TED hose on both legs and remove at bedtime, replace when arising the next morning. She needs to elevate the heels off the bed. She can resume a regular diet as tolerated.  She is to apply an ice pack to the affected area. Do not get the dressing bandage wet or dirty. Call [facility] orthopedics if the dressing gets water under it. Leave the dressing on.  Call [facility] orthopedics if any of the following occur such as bright red bleeding from the incision or wound or fever above 101.5 degrees. Also to call [facility] orthopedics if she develops any redness, swelling or drainage at the incision as well as leg pain, numbness or weakness, or bowel or bladder symptoms. She is referred home with home physical therapy as well as home health nursing. Please call [facility] if a therapist has not contacted her within 48 hours of her return homicidal.  A home health nurse will also evaluate her need for additional services upon return home. The patient has a follow-up appointment with [facility] orthopedics in 2 weeks as well as with Dr. Ola Spurr in 5 weeks.   DISCHARGE MEDICATIONS: Tylenol 325 mg oral tablet, 2 tabs orally every 4 hours as needed for temperature. Amlodipine 10 mg oral tablet, 1 tablet orally once a day. Allerease 180 mg oral tablet, 1 tablet orally once a day. Losartan 100 mg oral tablet, 1 tablet orally once a day. Metoclopramide 5 mg oral tablet, 1 tablet orally 4 times a day before meals and at bedtime. Advair Diskus 100 mcg/50 mcg inhalation powder, 1 puff inhaled every 12 hours. Atorvastatin 20 mg oral tablet, 1 tablet orally once a day at bedtime. Fluticasone 50 mcg inhaler nasal spray, 2 sprays nasal once a day. Omeprazole 20 mg oral delayed-release capsule, 1 cap orally once a day. Levothyroxine 25 mcg oral tablet, 1 tablet orally once a day. Oxycodone 5 mg oral tablet,  1 tablet every 4 hours as needed for pain. Tramadol 50 mg oral tablet, 1 tablet orally every 8 hours as needed for pain. Magnesium hydroxide 8% oral suspension 30 mL oral 2 times a day as needed for constipation. Lovenox 40 mg 0.4 mL injectable solution, 1 injectable once a day x 14 days. Vancomycin 1.25 grams/250 mL in D% IV solution, 1250 mg IV 2 times a day. Bisacodyl 10 mg rectal suppository, 1 suppository rectal once a day as needed for constipation. Docusate/senna 50 mg/8.6 mg oral tablet, 1 tablet orally 2 times a day. Docusate calcium 240 mg oral capsule, 1 capsule orally once a day. Line flush normal saline 5 mL injectable once a day. Line flush heparin 10 units mL injection, 5 mL injectable as needed for line patency.     ____________________________ Duanne Guess, PA-C tcg:cs D: 09/20/2012 13:47:00 ET T: 09/20/2012 14:10:46 ET JOB#: [id number]  cc: Duanne Guess, PA-C, <Dictator> Duanne Guess [place] ELECTRONICALLY SIGNED 09/20/2012 15:43

## 2014-12-21 NOTE — Consult Note (Signed)
Brief Consult Note: Diagnosis: Prostethic joint infection.   Patient was seen by consultant.   Consult note dictated.   Recommend to proceed with surgery or procedure.   Recommend further assessment or treatment.   Orders entered.   Comments: Rec surgeryper Dr Rudene Christians Cont vanco and await cx from recent aspiration.  Place PICC Thank you for the consult.  WIll follow with you.  Electronic Signatures: Angelena Form (MD)  (Signed 15-Jan-14 16:13)  Authored: Brief Consult Note   Last Updated: 15-Jan-14 16:13 by Angelena Form (MD)

## 2014-12-21 NOTE — Op Note (Signed)
PATIENT NAME:  Andrea Griffin, Andrea Griffin MR#:  [id number] DATE OF BIRTH:  Sep 15, 1931  DATE OF PROCEDURE:  09/15/2012  PREOPERATIVE DIAGNOSIS:  Right septic hip with hemiarthroplasty in place.  POSTOPERATIVE DIAGNOSIS:  Right septic hip with hemiarthroplasty in place.   PROCEDURE PERFORMED:  Irrigation and debridement of right hip.   ANESTHESIA:  General.   SURGEON:  Dr. Laurene Footman.   DESCRIPTION OF PROCEDURE:  The patient was brought to the operating room and after adequate anesthesia was obtained, using the pegboard, she was placed in the lateral position with the right side up.  The hip was prepped and draped in the usual sterile fashion.  After appropriate patient identification, timeout procedures were completed.  The prior skin incision was excised along with some scar tissue that went down to the deep tissue with a posterior lateral approach utilized.  Charnley retractor placed and with the hip internally rotated the capsule was incised and there was several milliliters of what appeared to be pus from the hip.  At this point, the hip was dislocated and the femoral head component was removed to allow for adequate debridement of the acetabulum.  Initially, 3 liters of antibiotic irrigation was used for initial cleansing of the joint.  Next, the VersaJet was used and this was used around the metal implant as well as around the base of the neck and periphery of the acetabulum to free up loose synovium.  A rongeur was used to remove and also with scalpels sharply dissect out synovitis, which was present more anteriorly.  After the second debridement was completed, the hip was again irrigated with 3 liters of antibiotic irrigation followed by 3 liters of saline.  At this point, the joint appeared to be in good condition.  There was articular cartilage present within the acetabulum and the scar tissue and synovitis around the periphery of the labrum had been debrided.  A 50 mm bipolar head with +5 C-tapered  neck was then impacted onto the femoral component and the hip reduced and appeared stable through range of motion on the table.  The deep fascia was repaired using a running quill suture.  Subcutaneous tissue closed with 2-0 Monocryl followed by staples and a wound VAC.  ESTIMATED BLOOD LOSS:  200 mL.   COMPLICATIONS:  There were no complications.   SPECIMEN:  No specimen as there had been previous culture.   CONDITION:  To recovery room, stable with an abduction pillow in place.      ____________________________ Laurene Footman, MD mjm:ea D: 09/15/2012 22:10:56 ET T: 09/15/2012 23:12:58 ET JOB#: [id number]  cc: Laurene Footman, MD, <Dictator> Laurene Footman MD ELECTRONICALLY SIGNED 09/16/2012 13:05

## 2014-12-21 NOTE — Consult Note (Signed)
PATIENT NAME:  Andrea Griffin, Andrea Griffin MR#:  [id number] DATE OF BIRTH:  05-Jul-1932  DATE OF CONSULTATION:  09/14/2012  REFERRING PHYSICIAN:     Hessie Knows, MD CONSULTING PHYSICIAN:  Tana Conch. Leslye Peer, MD  PRIMARY CARE PHYSICIAN:  Dion Body, MD  REASON FOR CONSULTATION:  Preop.   CHIEF COMPLAINT:  Hip pain.   HISTORY OF PRESENT ILLNESS: This is a 81 year old female who had a right hip replacement back in May. She complains of 6 out of 10 hip pain since the surgery, has not  gotten better. She walks with a cane and sort of drags her leg. She had a joint aspiration done yesterday that showed white blood cells and gram-positive cocci in singles intracellular. She was sent in for direct admission for OR exploration tomorrow. Otherwise she feels okay. No complaints of fever or chills or sweats. She has had a weight loss of 65 pounds not trying and  poor appetite. No complaints of shortness of breath or chest pain. Currently, the patient feels okay, 6 out of 10 pain in the right hip. The patient has not had any stress testing or cardiac catheterization that she knows of.  PAST MEDICAL HISTORY:  Hypothyroidism, gastroesophageal reflux disease, hyperlipidemia, asthma, hypertension and anxiety.   PAST SURGICAL HISTORY: Right hip replacement in May, left hip replacement, hysterectomy, 2 breast biopsies, scalp melanoma, bladder surgeries numerous.  ALLERGIES:  1.  AMOXICILLIN. 2.  CIPRO. 3.  ERYTHROMYCIN. 4.  PENICILLIN. 5.  QUINOLONES. 6.  RANITIDINE. 7.  SEPTRA.  MEDICATIONS:  Advair Diskus 100/50 one inhalation twice a day.  aller-ease 180, 1 tablet daily. Amlodipine 10 mg daily. Atorvastatin 20 mg daily. Flonase 2 sprays each nostril daily. Levothyroxine 50 mcg daily. Losartan 100 mg daily. Reglan 4 times a day.  Reglan 5 mg 4 times a day.  Omeprazole 20 mg daily. Tylenol 650 mg p.r.n.   SOCIAL HISTORY: No smoking. No alcohol. No drug use. Retired [employer].   FAMILY HISTORY:  Father died leukemia, also had heart disease. Mother died at 33 after a hip fracture. She had TIAs  and hypertension.   REVIEW OF SYSTEMS:  CONSTITUTIONAL: No fever, chills or sweats. Positive for weight loss, 65 pounds, cannot eat.  She does feel tired and wears out easily.  EYES: She does wear glasses.  EARS, NOSE, MOUTH AND THROAT:  Positive for sinus problems.  CARDIOVASCULAR: No chest pain. No palpitations.  RESPIRATORY: Occasional cough. No sputum. No hemoptysis.  GASTROINTESTINAL: Positive for nausea, poor appetite. No abdominal pain. No diarrhea. No constipation. No bright red blood per rectum. No melena.  GENITOURINARY: Positive for nocturia. No burning on urination. No hematuria.  MUSCULOSKELETAL: Positive for right hip pain, constant since surgery.  INTEGUMENT: No rashes or eruptions.  NEUROLOGIC: No fainting or blackouts.  PSYCHIATRIC: No anxiety or depression.  ENDOCRINE: No thyroid problems.  HEMATOLOGIC/LYMPHATIC:  No anemia. No easy bruising or bleeding.   PHYSICAL EXAMINATION: VITAL SIGNS: Temperature 98.2, pulse 72, respirations 20, blood pressure 135/75.  GENERAL: No respiratory distress.  EYES: Conjunctivae and lids normal. Pupils equal, round and reactive to light. Extraocular muscles intact. No nystagmus.  EARS, NOSE, MOUTH AND THROAT:  Tympanic membranes no erythema. Nasal mucosa no erythema. Throat no erythema. No exudate seen. Lips and gums no lesions.  NECK: No JVD. No bruits. No lymphadenopathy. No thyromegaly. No thyroid nodules palpated.   LUNGS: Lungs are clear to auscultation. No use of accessory muscles to breathe. No rhonchi, rales or wheeze heard.  CARDIOVASCULAR:  S1, S2 normal. No gallops, rubs or murmurs heard. Carotid upstroke 2+ bilaterally. No bruits.  EXTREMITIES: Dorsalis pedis pulses 2+ bilaterally. No edema of the lower extremity.  ABDOMEN: Soft, nontender. No organomegaly/splenomegaly. Normoactive bowel sounds. No masses felt.  LYMPHATIC: No  lymph nodes in the neck.  MUSCULOSKELETAL: No clubbing, edema or cyanosis.  Painful range of motion of the right hip. No pain to palpation over the right hip. The patient is unable to straight leg raise with the right hip.  SKIN: No rashes or ulcers seen. Surgical site on the right hip looks good.  NEUROLOGIC: Cranial nerves II through XII grossly intact. Deep tendon reflexes are 1+ left lower extremity, 1/2+ right lower extremity.  PSYCHIATRIC: The patient is oriented to person, place and time.   LABORATORY AND RADIOLOGICAL DATA: Rare gram-positive cocci in singles seen intracellular and packed with white blood cells on the body fluid culture of the hip. INR normal range. Platelet count is elevated at 554.   ASSESSMENT AND PLAN: 1.  Preoperative consultation for right hip infection.  Surgery needs to be done and the EKG currently is still pending and laboratory data is ordered. I will hold off on beta blocker at this time with the patient's history of asthma. The patient is a low risk for surgery, may proceed; again, surgery must be done for hip infection.  Worst case scenario:  The hardware may have to be removed depending on what he sees in there.  The patient will be on long-term antibiotics depending on what is seen in the operating room tomorrow. The patient was started on vancomycin which should cover the gram-positive cocci.  2.  Hypertension. Blood pressure is currently is stable on usual medications.  3.  Asthma, respiratory status stable on room air.  4.  Hypothyroidism. Continue levothyroxine.  5.  Gastroesophageal reflux disease, on omeprazole.  6.  Chronic hyponatremia.  We will check sodium level prior to surgery. The patient does not appear to be on any medications for this.  7.  Anxiety history. Not on any medication for this.  Time spent on consultation:  55 minutes.   CODE STATUS:  The patient is a full code.      ____________________________ Tana Conch. Leslye Peer,  MD rjw:ct D: 09/14/2012 14:12:28 ET T: 09/14/2012 14:45:59 ET JOB#: [id number]  cc: Tana Conch. Leslye Peer, MD, <Dictator> Laurene Footman, MD Dion Body, MD Marisue Brooklyn MD ELECTRONICALLY SIGNED 09/15/2012 20:13

## 2014-12-21 NOTE — Consult Note (Signed)
PATIENT NAME:  Andrea Griffin, Andrea Griffin MR#:  [id number] DATE OF BIRTH:  1932/06/21  DATE OF CONSULTATION:  09/14/2012  REFERRING PHYSICIAN:  Hessie Knows, MD  CONSULTING PHYSICIAN:  Cheral Marker. Ola Spurr, MD  PRIMARY CARE PHYSICIAN: Dion Body, MD  REASON FOR CONSULTATION: Right prosthetic hip joint infection.   HISTORY OF PRESENT ILLNESS: This is a very pleasant 81 year old female with a history of GERD, hypothyroidism, hyperlipidemia, hypertension and obesity as well as chronic osteoarthritis. She underwent surgical right hip hemiarthroplasty on 01/21/2012 for right femoral neck fracture. She was then readmitted June 20th for drainage of a right hip postop hematoma. Enterococcus was cultured from this and she was treated with vancomycin via PICC line. She then developed a pseudomonas urinary tract infection and was treated with aztreonam, at the nursing facility. She was readmitted July 4th with worsening infection at the site. She underwent bedside debridement and culture from the wound grew MSSA.  The patient was discharged on Keflex for 10 days. When seen in follow-up by Dr. Rudene Christians, in the middle of October, she appeared to be doing well and she was off antibiotics at that time. Incision was noted to be well healed. She continued to follow with Dr. Rudene Christians and was seen again in October and continued with physical therapy.   She was seen by Dr. Rudene Christians on January 6th complaining of worsening right hip pain. Per the patient, she says this has been ongoing but worsening over the last several weeks. She has had no fevers, chills or night sweats. She does have some heat at the site. She has difficulty walking due to the pain. She says the wound is healed and there is no open drainage. Dr. Rudene Christians checked a sed rate, which was markedly elevated. She was sent for drainage and IR removed purulent material from the joint, on July 14th. They described 5 mL of turbid, yellow viscous fluid removed. She was admitted and is  planned for surgery tomorrow with removal of the ball of the hemiarthroplasty and a washout.   PAST MEDICAL HISTORY: 1. Infected prosthetic hip as above.  2. Hypertension.  3. Hypothyroidism.  4. Hyperlipidemia.  5. Gastroesophageal reflux disease.  6. Asthma.  7. Obesity.  8. Anxiety.  9. Recurrent nausea and vomiting. Worked up by GI with two recent admissions.   PAST SURGICAL HISTORY:  1. Hip hemiarthroplasty as above.  2. Hysterectomy.  3. Bladder surgery.  4. Cholecystectomy.   SOCIAL HISTORY: The patient lives with her husband. She is retired. No alcohol, drugs or tobacco use.   FAMILY HISTORY: Positive for myocardial infarction and leukemia in her father. Her mother lived to be 5.   ALLERGIES: THE PATIENT IS LISTED AS BEING ALLERGIC TO SULFA, CIPRO, MACRODANTIN AND AMOXICILLIN.   CURRENT MEDICATIONS:  1. On admission the patient has been started on vancomycin, dosing per pharmacy. 2. Advair. 3. Omeprazole. 4. Losartan. 5. Levothyroxine. 6. Fluticasone nasal spray. 7. Allegra. 8. Atorvastatin. 9. Amlodipine.   REVIEW OF SYSTEMS: Eleven systems reviewed and negative, except as per HPI.   PHYSICAL EXAMINATION:  VITAL SIGNS: Temperature 98.2, pulse 72, blood pressure 135/75, respirations 20 and sat 98% on room air.   GENERAL: She is pleasant, interactive, alert and lying in bed in no acute distress.   HEENT: Pupils are equal, round and reactive to light and accommodation. Extraocular movements are intact. Sclerae anicteric. Oropharynx is clear.   NECK: Supple. No anterior cervical, posterior cervical or supraclavicular lymphadenopathy.   CARDIAC: Heart is regular.  PULMONARY: Lungs are clear to auscultation bilaterally.   ABDOMEN: Soft, obese, nontender and nondistended.   EXTREMITIES: There is trace bilateral edema. She has on her right hip a well healed scar. There is some mild tenderness with movement of her hip. She has a puncture site that has no  drainage from it where IR drained recently.   SKIN: Normal.  PSYCH: Affect is normal.  DATA: [facility] done at [facility] on January 6th was 86. CRP 16.9.   Microscopic data: Drainage of her hip joint on January 14th has grown gram-positive cocci in singles. They are intracellular in nature. There are packed white blood cells. Culture is pending. Nucleated cell count was 37,771, 97% of which were neutrophils. Platelets were 54.   INR was 0.9. No other blood work is available.   IMPRESSION: Prosthetic joint infection following a complicated postoperative period from a hematoma with enterococcus and a wound culture with MSSA. This seems to be brewing for some time now so I think it is likely to be a less virulent organism. Dr. Rudene Christians plans to take her for washout and change of the ball of the hemiarthroplasty tomorrow.   RECOMMENDATIONS: 1. Start vancomycin with dosing per pharmacy. We will check a CBC and a basic metabolic panel. Goal vancomycin trough will be 15 to 20.  2. She will likely need 6 weeks of IV antibiotics so I would go ahead and place a PICC.  3. We will follow up on cultures and base further antibiotic recommendations based on that, although she does have several antibiotic allergies.  4. After her six-week course, we can follow her ESR as an outpatient and decide if she would benefit from prolonged suppressive therapy or if she would need further treatment with removal of the joint and a spacer and then repeat insertion.   Thank you very much for the consult. I would be glad to follow along with you. ____________________________ Cheral Marker. Ola Spurr, MD dpf:sb D: 09/14/2012 14:08:13 ET T: 09/14/2012 14:32:02 ET JOB#: [id number]  cc: Cheral Marker. Ola Spurr, MD, <Dictator> Enriqueta Augusta Ola Spurr MD ELECTRONICALLY SIGNED 09/20/2012 19:23

## 2014-12-21 NOTE — H&P (Signed)
PATIENT NAME:  Andrea Griffin, Andrea Griffin MR#:  [id number] DATE OF BIRTH:  [birth year]  DATE OF ADMISSION:  09/14/2012  CHIEF COMPLAINT:  Right hip pain.   HISTORY OF PRESENT ILLNESS:  The patient is a 81 year old female who suffered a right femoral neck fracture 01/20/2012, underwent right hip hemiarthroplasty, subsequently developed a hematoma in June and had an I and D with wound VAC 03/03/2012.  She was seen in the office on 09/12/2012 and was noted to have decreased joint space and pain in the right hip.  Sed rate and C-reactive protein were elevated and yesterday, she had a hip aspiration that showed many white cells and gram-positive cocci.  She is being admitted for treatment of presumed staph infection of the right hip.  She has been having some groin pain, has been walking with a cane at times.   PAST MEDICAL HISTORY:  Is remarkable for the current history as noted, hypertension, hypothyroidism, hyperlipidemia, reflux, asthma, anxiety and frequent problems with nausea and vomiting.   PAST SURGICAL HISTORY:  The right hip hemiarthroplasty, prior left hip hemiarthroplasty, prior hysterectomy, cholecystectomy and bladder surgery.   SOCIAL HISTORY:  Negative for alcohol or tobacco use.  She lives with her husband and tries to remain active.   FAMILY HISTORY:  Positive for cardiac disease.   ALLERGIES:  SULFA, CIPRO, MACRODANTIN AND AMOXICILLIN.   MEDICATIONS ON ADMISSION:  Amlodipine 10 mg daily, atorvastatin tablet 20 mg daily, Allegra 180 mg daily, Flonase spray 2 sprays to both nostrils daily, Synthroid 0.05 mg daily, Advair Diskus daily, losartan and omeprazole 20 mg daily.   REVIEW OF SYSTEMS:  Positive for her hip pain.  No recent GI problems other than her usual nausea, vomiting problems.  She did have an admission back in September for colitis.  PHYSICAL EXAMINATION: GENERAL:  Well-developed, well-nourished female, appears her stated age in no acute distress.  HEENT:  Unremarkable.  She has  fairly good range of motion of the neck. LUNGS:  Clear to auscultation.  HEART:  Regular rate and rhythm.  LOWER EXTREMITIES:  She has some pain with hip rotation that is mild.  She has a healed right hip scar with a scar down to the deep tissue.  No drainage at present.  She has palpable dorsalis pedis and posterior tib pulse.  She is able to flex and extend the toes and muscle function intact.    CLINICAL IMPRESSION:  Right hip prosthetic hemiarthroplasty.   PLAN:  Is for irrigation and debridement, already having obtained cultures.  Antibiotics will be started now, consultation with Dr. Ola Spurr for infectious disease and postop antibiotic treatment.  Her stem appears well-fixed and does not appear to be loose and we will try to preserve this with thorough debridement of the hip tomorrow and replacing the femoral head portion of the prosthesis at that time to allow for adequate debridement of the neck of the prosthesis.  Risks, benefits and possible complications were discussed with the patient.    ____________________________ Laurene Footman, MD mjm:ea D: 09/15/2012 00:17:00 ET T: 09/15/2012 03:42:30 ET JOB#: [id number]  cc: Laurene Footman, MD, <Dictator> Laurene Footman MD ELECTRONICALLY SIGNED 09/15/2012 8:25

## 2014-12-22 NOTE — Discharge Summary (Signed)
PATIENT NAME:  Andrea Griffin, Andrea Griffin MR#:  [id number] DATE OF BIRTH:  [birth year]  DATE OF ADMISSION:  03/27/2014 DATE OF DISCHARGE:  03/30/2014   ADMITTING DIAGNOSIS: Painful left hip osteoarthritis with prior hip hemiarthroplasty.   DISCHARGE DIAGNOSIS: Painful left hip osteoarthritis with prior hip hemiarthroplasty.    PROCEDURE: Revision left hemiarthroplasty to total hip replacement.   ANESTHESIA: Spinal.   SURGEON: Laurene Footman, MD   ESTIMATED BLOOD LOSS: 150 mL.   COMPLICATIONS: None.   SPECIMEN: Culture.   IMPLANTS: DePuy +5, 28 mm head, 12/14 taper with a 52 mm Versafitcup and appropriate liner.   CONDITION: To recovery room stable.   HISTORY: The patient is an 81 year old female who presents for history and physical for left hip revision. She underwent a left hip hemiarthroplasty on 07/06/2009. Over the last 4 to 5 years, her hip pain has increased and has progressively gotten worse to the point that she is now ambulating with a limp. Her pain is moderate. Her pain is located in the left lateral thigh and groin. She has pain with weight-bearing activity. She describes stiffness. She is not taking any medications for pain.   PHYSICAL EXAMINATION:  GENERAL: Well developed, well nourished, in no apparent distress. Normal affect. Normal gait with mil antalgic component.  HEENT: Head normocephalic, atraumatic. Pupils equal, round and reactive to light.  ABDOMEN: Soft, nontender, nondistended. Bowel sounds present.  HEART: Reveals regular rate and rhythm.  LUNGS: Clear to auscultation bilaterally. No wheezing, rales, rhonchi. LUMBAR SPINE: Reveals no bony abnormality, no edema, no ecchymosis. There is no step-off. She has full active and passive range of motion. She has normal lateral bend and rotation. There is no pain with range of motion activities. She is nontender along the spinous process. She has a negative straight leg raise bilaterally.  LEFT LOWER EXTREMITY: Revealed no  bony abnormality, no edema, no ecchymosis. She has full active and passive range of motion of the hips, knees and ankles. The patient has decreased internal and external rotation of the left hip. She has full abduction and adduction of the hip. She has moderate anterior hip joint tenderness. She has a negative axial load test at the hip joint. The patient has mild pain going from hip flexion into extension. There is discomfort with internal rotation of the hip. She is tender along the left greater trochanteric region. She has negative Homans test bilaterally. There is normal skin warmth. There is no redness. She has normal capillary refill, and she is neurovascularly intact in the left lower extremity.   HOSPITAL COURSE: The patient was admitted to the hospital on 03/27/2014. She had surgery that same day and was brought to the orthopedic floor from the PACU in stable condition. On postoperative day 1, she had acute postoperative blood loss anemia with a hemoglobin of 11.4. This was not far from her baseline. Other labs remained stable, and vital signs were stable, and she was doing well. On postoperative day 2, the patient made slow progress in physical therapy. She was not ambulating much. She desired to go home, but physical therapy recommended skilled nursing facility. On postoperative day 3, the patient continued to not make progress with physical therapy. Her vital signs were stable, and she was stable and ready for discharge to skilled nursing facility. She will continue physical therapy and occupational therapy.   CONDITION AT DISCHARGE: Stable.   DISCHARGE INSTRUCTIONS: The patient may gradually increase weight-bearing on the affected extremity. Elevate the affected foot or leg  on 1 or 2 pillows with the foot higher than the knee. Thigh-high TED hose on both legs and remove 1 hour per 8-hour shift. Elevate the heels off the bed. Use incentive spirometer every hour while awake and encourage cough and  deep breathing. The patient may resume a regular diet as tolerated. Apply an ice pack to the affected area. Do not get the dressing bandage wet or dirty. Call [facility] if the dressing gets water under it. Leave the dressing on. Call [facility] if any of the following occur: Bright red bleeding from the incisional wound, fever above 101.5 degrees, redness, swelling or drainage at the incision. Call [facility] if you experience any increased leg pain, numbness or weakness in your legs or bowel or bladder symptoms. Physical therapy has been arranged for continuation at rehab facility. Call [facility] for followup appointment in 2 weeks.   DISCHARGE MEDICATIONS: Please see discharge instructions for list of discharge medications.   ____________________________ T. Rachelle Hora, PA-C tcg:lb D: 03/30/2014 10:52:08 ET T: 03/30/2014 11:00:55 ET JOB#: [id number]  cc: T. Rachelle Hora, PA-C, <Dictator> Duanne Guess [place] ELECTRONICALLY SIGNED 03/30/2014 17:45

## 2014-12-22 NOTE — Op Note (Signed)
PATIENT NAME:  Andrea Griffin, Andrea Griffin MR#:  [id number] DATE OF BIRTH:  [birth year]  DATE OF PROCEDURE:  03/27/2014  PREOPERATIVE DIAGNOSIS: Painful left hip osteoarthritis with prior hip hemiarthroplasty.   POSTOPERATIVE DIAGNOSIS:  Painful left hip osteoarthritis with prior hip hemiarthroplasty.  PROCEDURE: Revision left hemiarthroplasty to total hip replacement.   ANESTHESIA: Spinal.   SURGEON: Hessie Knows, M.D.   DESCRIPTION OF PROCEDURE: The patient was brought to the operating room and after adequate anesthesia was obtained, the right leg was placed on a well-padded table, left foot in the Medacta attachment. After prepping and draping in the usual sterile fashion, appropriate patient identification, timeout procedures were completed. C-arm preop x-ray was taken as well to assess alignment.   After timeout procedure completed a direct anterior approach was made with incision down through the skin and subcutaneous tissue. The tensor fascia muscle was identified and the fascia incised. The muscle retracted laterally. The deep retractors were placed and the lateral femoral circumflex vessels were visualized and ligated. The anterior capsule was then opened. There was minimal fluid within the joint. A culture was obtained, but there is no evidence of infection. After adequate exposure of the capsule, the hip was dislocated and the femoral head was removed. The stem appeared stable. 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 The hip was then reamed. It had a 48 mm head in.  It was reamed to 52 mm, at which point there was good bleeding bone. A 52 mm trial fit well and the 52 mm Versafitcup DM was impacted into place.   Next, trials were made and the +5 gave appropriate leg length and this was chosen as the final component. The dual mobility head was assembled with a DePuy 12/14 taper  28 mm head +5 attached to a 52 mm liner for the Versafitcup DM from [place]. This was assembled and then impacted onto the head. The hip was stable through range of motion, including 90 degrees external rotation test.   The wound was thoroughly irrigated. The anterior capsule was repaired using Ethibond suture. The wound was infiltrated with 30 mL of 0.5% Sensorcaine. The wound was closed with heavy quill deep for the tensor fascia. A subcutaneous drain was placed, followed by a subcutaneous quill suture and skin staples. Xeroform, 4 x 4's, ABD and tape applied and the patient was sent to the recovery room in stable condition.   ESTIMATED BLOOD LOSS: 150 mL.   COMPLICATIONS: None.   SPECIMEN: Culture.   IMPLANTS: DePuy +5,  28 mm head, 12/14 taper with a 52 mm Versafitcup and appropriate liner.   CONDITION: To recovery room, stable.    ____________________________ Laurene Footman, MD mjm:jh D: 03/27/2014 18:22:45 ET T: 03/27/2014 18:52:34 ET JOB#: [id number]  cc: Laurene Footman, MD, <Dictator> Laurene Footman MD ELECTRONICALLY SIGNED 03/28/2014 8:20

## 2014-12-23 NOTE — Discharge Summary (Signed)
PATIENT NAME:  Andrea Griffin, Andrea Griffin MR#:  [id number] DATE OF BIRTH:  [birth year]  DATE OF ADMISSION:  03/03/2012 DATE OF DISCHARGE:  03/07/2012  DISCHARGE DIAGNOSES:  1. Right hip wound infection (staph aureus) status post incision and drainage and history of open reduction and internal fixation.  2. Hypothyroidism.  3. Hyperlipidemia.  4. Gastroesophageal reflux disease.  5. Asthma.  6. Hypertension.  7. Anxiety.  8. Hyponatremia.   DISCHARGE MEDICATIONS:  1. MiraLax 17 grams p.o. daily as needed.  2. Nucynta 50 mg p.o. every 4 to 6 hours p.r.n. for pain.  3. Ondansetron 4 mg p.o. every four hours p.r.n. for nausea and vomiting.  4. Tylenol 325 mg 2 tabs p.o. every 4 hours as needed for pain.  5. Omeprazole 20 mg p.o. daily.  6. Fexofenadine 180 mg p.o. daily. 7. Fluticasone 50 mcg two sprays to each nostril daily.  8. Levothyroxine 50 mcg p.o. daily.  9. Advair 100/50 one puff p.o. twice a day. 10. Atorvastatin 20 mg p.o. daily.  11. Keflex 500 mg p.o. three times daily x10 more days.  12. Amlodipine 10 mg p.o. daily.  13. Losartan 100 mg p.o. daily.  14. Aspirin 81 mg p.o. daily.  15. Ultram 50 mg p.o. every 6 hours p.r.n. for pain.   CONSULTANTS:  1. Hessie Knows, MD - Orthopedics. 2. Lanae Boast, MD - Infectious Disease.   PROCEDURES: The patient had incision and drainage performed at bedside per Dr. Rudene Christians on 03/03/2012.  PERTINENT LABS ON DISCHARGE: Sodium 128, potassium 3.7, and creatinine 0.61.  Wound culture grew out staph aureus sensitive to oxacillin.   Urine culture was negative.   BRIEF HOSPITAL COURSE:  1. Acute right hip pain with drainage. The patient was noted to have acute wound infection. It was evaluated by both Dr. Clayborn Bigness and Dr. Rudene Christians. Dr. Rudene Christians performed a bedside incision and drainage on 03/03/2012 and placed a wound VAC which she has tolerated very well. The patient's white blood cell count has been stable. She has been afebrile and her wound culture  did grow out staph aureus. She was initially placed on vancomycin and Zosyn and also on aztreonam. Wound culture was sensitive to oxacillin; therefore, per Dr. Clayborn Bigness, the patient was switched over to Keflex 500 mg three times daily for 10 more days. She will receive a total of two weeks of antibiotics. She is to follow up with Dr. Rudene Christians in the office in 10 days. She will go home with a wound vac with home nursing and physical therapy.   2. Hypertension. Blood pressure was acutely elevated likely secondary to pain and infection. The hydrochlorothiazide was held because of her hyponatremia. She was started on amlodipine 10 mg and Losartan was increased to 100 mg daily. We will need to follow this as an outpatient.  3. Hyponatremia. New finding. Hydrochlorothiazide was held and with this will need to be monitored as an outpatient. It did improve slightly with fluids. We will hold off on any diuretics.  4. Her other chronic medical issues remained stable.              DISPOSITION: She is in stable condition and is being discharged to home with home health, PT and nursing. Followup with Dr. Rudene Christians in 10 days and followup with Dr. Netty Starring within 14 days. She may followup with Dr. Clayborn Bigness if needed if there are any questions with her antibiotics and infection.  ____________________________ Dion Body, MD kl:slb D: 03/07/2012 48:27:07 ET T: 03/08/2012 10:20:15 ET  JOB#: [id number]  cc: Dion Body, MD, <Dictator> Kilbourne Blocker, MD Laurene Footman, MD Dion Body MD ELECTRONICALLY SIGNED 03/14/2012 16:41

## 2014-12-23 NOTE — Consult Note (Signed)
Impression: 81yo WF w/ h/o recent right hip fracture, s/p ORIF complicated by infected hematoma with Enterococcus and Pseudomonas UTI admitted with low grade temp elevation and malaise.  Given her allergies, she was treated for both the infected hematoma and the UTI with IV therapy via a PICC line.  The PICC is currently not functioning. Her urinalysis shows no evidence for inflammation.  It may be possible to d/c therapy for the UTI.  Will give her another day or two of aztreonam. Dr. Rudene Christians is planning to I&D the wound and obtain cultures.  Will continue the vanco until these cultures return.  We may be able to change to oral linezolid upon discharge. Follow fever curve and WBC.  Electronic Signatures: Ronnell Makarewicz, Heinz Knuckles (MD)  (Signed on 04-Jul-13 10:49)  Authored  Last Updated: 04-Jul-13 10:49 by Athalie Newhard, Heinz Knuckles (MD)

## 2014-12-23 NOTE — Consult Note (Signed)
Brief Consult Note: Diagnosis: pre-op medical eval.   Patient was seen by consultant.   Consult note dictated.   Recommend to proceed with surgery or procedure.   Orders entered.   Comments: *pre-op eval - pt considered low risk for intermediate risk surgery, she has no prior cardiac hx, she states she's active and does not experience any exertional cardiac symptoms such as CP/SOB, therefore recommend proceeding with surgery.  she does have some EKG changes with lateral TWIs and has had a myoview as an outpatient (exact date unknown but [facility] will have records), would hold ASA in operative setting to reduce bleeding complications and restart asap post-op.  *hyponatremia - mild, could be hypovolemic as very dry mucus membranes noted, will follow Na with ivf's  *leukocytosis - mild, suspect stress-induced, pt afebrile, would check UA, follow WBC  *hypokalemia - mild  *htn - cont pain control, benicar, prn iv labetalol  *right hip fracture post fall - plan for surgery per ortho, ok to proceed with surgery as above  *asthma - stable and without acute exac, cont advair  *GERD - cont PPI  *hyperlipidemia - cont lipitor  Thanks for this consult and for allowing me to participate in Andrea Griffin's care..  Electronic Signatures: Dagoberto Ligas (MD)  (Signed [phone] 23:18)  Authored: Brief Consult Note   Last Updated: 22-May-13 23:18 by Dagoberto Ligas (MD)

## 2014-12-23 NOTE — Op Note (Signed)
PATIENT NAME:  Andrea Griffin, Andrea Griffin MR#:  [id number] DATE OF BIRTH:  1932/01/05  DATE OF PROCEDURE:  02/18/2012  POSTOPERATIVE DIAGNOSIS: Right hip postop hematoma.  POSTOPERATIVE DIAGNOSIS: Right hip postop hematoma.   PROCEDURE: Incision and drainage of postop hematoma.   ANESTHESIA: General.  SURGEON: Laurene Footman, MD    DESCRIPTION OF PROCEDURE: The patient was brought to the operating room and after adequate anesthesia was obtained she was placed in the lateral decubitus position with the right side up using the beanbag to hold her in this position. After prepping and draping the hip in the usual sterile fashion, appropriate patient identification and time-out procedures were carried out. There was an approximately 0.5 cm open area in the midportion of the wound that had serosanguineous drainage. This was elliptically excised and an approximately 5 cm incision created. A culture was obtained at this point and antibiotics given although there did not appear to be any infection. On inspection there was a large clot present within the space as well as serous fluid. The deep fascia appeared to be intact and there did not appear to be any communication with the joint. After initial inspection, the area was thoroughly irrigated with pulsatile lavage. After thoroughly irrigating out the wound, the wound was closed with deep drains placed with 2-0 Vicryl subcutaneously, a subcuticular 4-0 Monocryl, and 2-0 nylon in a vertical mattress fashion to get watertight closure of the incision. The wound was dressed with Xeroform, 4 x 4's, ABD, and tape and the patient was sent to the recovery room in stable condition.   SPECIMEN: Culture.  COMPLICATIONS: There were no complications.  ESTIMATED BLOOD LOSS: Minimal.   ____________________________ Laurene Footman, MD mjm:drc D: 02/18/2012 21:42:00 ET T: 02/19/2012 10:32:17 ET JOB#: [id number] Christian Mate Lovell Nuttall MD ELECTRONICALLY SIGNED 02/19/2012 11:00

## 2014-12-23 NOTE — H&P (Signed)
PATIENT NAME:  Andrea Griffin, Andrea Griffin MR#:  [id number] DATE OF BIRTH:  03-06-1932  DATE OF ADMISSION:  03/03/2012  PRIMARY CARE PHYSICIAN: Dion Body, MD ORTHOPEDIC SURGEON: Hessie Knows, MD   REFERRING PHYSICIAN:   Belva Bertin, MD    CHIEF COMPLAINT: Right hip wound discharge and pain/fever.   HISTORY OF PRESENT ILLNESS: The patient is a 81 year old female status post right hip fracture, status post right hip hemiarthroplasty on 01/21/2012. The patient was discharged to subacute rehab where she has been doing good relatively where she has been doing okay with subacute rehab, reached to a degree where she can walk with a walker; and then she had complication of a hematoma where it was evacuated by Dr. Rudene Christians on 02/18/2012. The patient started to have purulent discharge last week from the right hip wound site as well as erythema and pain. The patient has been followed by [facility] as an outpatient where initially she was started on p.o. clindamycin for wound cellulitis without much improvement, and then she was advanced to IV vancomycin and Azactam (she has multiple antibiotic allergies) in the last 48 hours. The patient was brought due to complaints of fever. Even though there is no documented fever, the patient reports her temperatures were 99. As well, she continues to complain of significant pain at her surgical site. The patient was afebrile in the ED, did not have any leukocytosis, but pain was significant, so admission was requested by the Hospitalist Service for further management of the patient's wound infection and pain. The patient was diagnosed with a Pseudomonas urinary tract infection in the rehab facility where her urine culture on June 26th was growing Pseudomonas, more than 100,000 colonies, which appears to be treated at this point as the patient has no dysuria, no polyuria, and urinalysis came back negative. The patient denies any cough, productive sputum, dysuria, polyuria, chest  pain, coffee-ground emesis, shortness of breath or leg swelling.   PAST MEDICAL HISTORY:  1. Hypothyroidism.  2. Hyperlipidemia.  3. Reflux.  4. Asthma.  5. Hypertension.  6. Obesity.  7. Anxiety.   PAST SURGICAL HISTORY:  1. Recent right hip fracture, status post right hip hemiarthroplasty on 01/21/2012 with hematoma evacuation on 02/18/2012.  2. Hysterectomy.  3. Bladder surgery for recurrent cystitis.  4. Cholecystectomy.  5. Prior left hip hemiarthroplasty for femoral neck fracture in 2010.   ALLERGIES: Sulfa, Cipro, Macrodantin, ranitidine,  amoxicillin.   FAMILY HISTORY: Positive for father dying at the age of 26 of heart disease and leukemia. Mother died at the age of 43.   SOCIAL HISTORY: She is married. She lives with her husband, but currently she is a subacute rehab resident for the last month and a half. Nondrinker. Nonsmoker. No drug use.   HOME MEDICATIONS:  1. Tylenol 650 mg suppository every 4 hours as needed.  2. Advair 100/50, 1 puff every 12 hours. 3. Aspirin 325 mg daily for anticoagulation. 4. Atorvastatin 20 mg at bedtime.  5. Azactam 2 grams every 12 hours.  6. Docusate sodium 100 mg every 12 hours for constipation.  7. Fexofenadine 180 mg oral daily.  8. Fluticasone 50 mcg nasal spray b.i.d.   9. Levothyroxine 50 mcg daily.  10. Losartan 50 mg daily.  11. MiraLax 17 grams as needed.  12. Nucynta 50 mg, 1 to 2 tablets every 4 hours as needed for pain.  13. Omeprazole 20 mg daily.  14. Zofran 4 mg every 4 hours as needed.  15. Senna 1 tablet  b.i.d.  16. Skelaxin 800 mg t.i.d. as needed.  17. Vancomycin 1 gram every 12 hours.  18. Zolpidem 5 mg, 2 tablets at bedtime as needed for insomnia.   REVIEW OF SYSTEMS: CONSTITUTIONAL: The patient had complaints of fever, documented. Complains of generalized weakness and fatigue. EYES: Denies blurry vision, double vision, pain. ENT: Denies tinnitus, ear pain, hearing loss. RESPIRATORY: Denies cough, wheezing,  hemoptysis, dyspnea. CARDIOVASCULAR: Denies chest pain, orthopnea, edema, arrhythmia, or palpitations. GASTROINTESTINAL: Complains of nausea. Denies any vomiting, diarrhea, abdominal pain, hematemesis. GU: Denies any dysuria, hematuria, or renal colic. ENDOCRINE: Denies polyuria, polydipsia, heat or cold intolerance. HEMATOLOGY: Recent wound hematoma, status post evacuation. Denies any history of blood clot or easy bruising. INTEGUMENTARY: Denies any acne, rash, or itching. MUSCULOSKELETAL: Denies any neck pain, back pain. Complains of right hip pain. NEUROLOGIC: Denies any numbness, dysarthria, epilepsy, tremors, vertigo. Has left leg limp, status post left hemiarthroplasty in 2010. PSYCHIATRIC: Denies any history of bipolar, schizophrenia, nervousness. Has depression and anxiety and insomnia.   PHYSICAL EXAMINATION:  VITAL SIGNS: Temperature 97, pulse 90, respiratory rate 20, blood pressure 153/70, saturating 98%.   GENERAL: A frail female in mild distress due to pain.   HEENT: Head atraumatic, normocephalic. Pupils are equal, reactive to light. Pink conjunctivae. Anicteric sclerae. Moist oral mucosa.   NECK: Supple. No thyromegaly. No JVD.   CHEST: Good air entry bilaterally. No wheezing, rales, rhonchi.   CARDIOVASCULAR: S1, S2 heard. No rubs, murmur, or gallops.   ABDOMEN: Soft, nontender, nondistended. Bowel sounds present.   EXTREMITIES: No edema, no clubbing, no cyanosis. Has right hip surgical wound which healed nicely in the inferior  part, and upper  part has purulent drainage and some erythema, covered with wound VAC.   PSYCHIATRIC: Appropriate affect. Awake, alert and oriented x3. Intact judgment and insight.   NEUROLOGICAL: Cranial nerves are grossly intact. Motor five out of five. Sensation symmetrical.  PERTINENT LABORATORY DATA: Glucose 110, BUN 5, creatinine 0.71, sodium 125, potassium 2.9, chloride 89, CO2 29, white blood cells 10.1, hemoglobin 10.8, hematocrit 30.4, platelet  s509. Urinalysis negative.   ASSESSMENT AND PLAN:  1. Right hip wound infection, extent of infection is unclear at this point: Blood cultures will be sent. Wound cultures were sent. We will continue the patient on broad-spectrum IV antibiotics, IV vancomycin and Azactam. We will discontinue p.o. clindamycin. We will consult Infectious Disease, Dr. Clayborn Bigness, as well as we will consult Orthopedics, Dr. Rudene Christians, to see if there is any surgical intervention needed and what type of imaging, if needed. It seems pain is uncontrolled. We will have her on morphine 4 mg every 3 to 4 hours as needed for pain. We will keep her n.p.o. for possible need of surgical intervention by Orthopedics.   2. Hyponatremia, appears to be chronic, asymptomatic: We will check urine sodium and osmolality, but this is most likely secondary to hypovolemia. We will continue with IV normal saline.  3. Hypokalemia: We will replace, recheck level in the a.m. We will check magnesium level in the a.m. as well.  4. Recent history of pseudomonas urinary tract infection: Appears to be treated as currently urinalysis is negative.  5. Hypothyroidism: Continue with Synthroid.  6. Hyperlipidemia: Continue with statin.  7. Gastroesophageal reflux disease: Continue with PPI.  8. Asthma: Continue with Advair and albuterol p.r.n.  9. Hypertension: Continue with home medications.  10. The patient will be started on subcutaneous heparin 5000 units every 12 hours for deep vein thrombosis prophylaxis. We will hold  her aspirin 325 mg daily for possible need of surgical intervention. We will monitor closely secondary to recent history of wound hematoma.  11. GI prophylaxis on Protonix.  12. CODE STATUS:  FULL CODE.  This was discussed with the family who are at bedside and with the patient, and all their questions were answered appropriately.   TIME SPENT:   Total time spent on patient care was 55 minutes.  ____________________________ Albertine Patricia, MD dse:cbb D: 03/03/2012 02:24:49 ET T: 03/03/2012 13:52:12 ET JOB#: [id number] cc: Dion Body, MD Travares Nelles Graciela Husbands MD ELECTRONICALLY SIGNED 03/12/2012 0:19

## 2014-12-23 NOTE — Discharge Summary (Signed)
PATIENT NAME:  Andrea Griffin, Andrea Griffin MR#:  [id number] DATE OF BIRTH:  [birth year]  DATE OF ADMISSION:  01/20/2012 DATE OF DISCHARGE:  01/27/2012   DISCHARGE DIAGNOSES:  1. Status post right hip fracture status post right hip hemiarthroplasty on 01/21/2012.  2. Hypertension.  3. Hypothyroidism.  4. Hyperlipidemia.  5. Gastroesophageal reflux disease.  6. Asthma.  7. Anxiety.  8. Obesity.  9. Hyponatremia.  10. Anemia.   DISCHARGE MEDICATIONS:  1. Tylenol 1000 mg p.o. t.i.d. scheduled.  2. Lipitor 20 mg p.o. at bedtime.  3. Levothyroxine 50 mcg p.o. daily. Give on an empty stomach in the morning.  4. Advair 100/50 1 puff b.i.d.  5. Flonase two sprays each nostril daily.  6. Fexofenadine 180 mg p.o. daily.  7. Aspirin 81 mg p.o. daily.  8. Calcium and Vitamin D 400/600 2 tabs p.o. daily.  9. Prilosec 20 mg p.o. daily prior to meal.  10. Colace 100 mg p.o. b.i.d. p.r.n. for constipation.  11. MiraLAX 17 grams p.o. daily.  12. Zofran 4 mg p.o. q.6 hours p.r.n. for nausea and vomiting. May provide the ODT formula if necessary.  13. Losartan 50 mg p.o. daily.  14. Nucynta 50 mg 1 to 2 tabs p.o. q.4 to 6 hours p.r.n. for pain.   CONSULT: Orthopedics per Dr. Rudene Christians   PROCEDURE: Right hip hemiarthroplasty on 01/21/2012.   PERTINENT LABS ON DAY OF DISCHARGE: Sodium 129, potassium 3.6, creatinine 0.54, white blood cell count 7.9, hemoglobin 9.2, platelets 432.   BRIEF HOSPITAL COURSE:  1. Right hip fracture. The patient initially came in status post fall with an acute right hip fracture. Was seen by Orthopedics and underwent right hip hemiarthroplasty on 01/21/2012 without any complications. She has been seen by physical therapy and will continue therapy at [facility] upon discharge. Her pain will be controlled with scheduled Tylenol 1000 mg t.i.d. and also with Nucynta 50 mg 1 to 2 tabs every 4 to 6 hours as needed for pain.  2. Hypertension. The patient's blood pressure fluctuated during her  hospitalization likely due to pain. She was started on losartan 50 mg p.o. daily. Will continue with this medication at this time. 3. Hypothyroidism. The patient will continue on her regular home regimen.  4. Other chronic medical issues remained stable.  5. New diagnosis of hyponatremia which is improving, was 129 on day of discharge. Will need to monitor this as an outpatient.  6. Anemia likely secondary to surgery. Hemoglobin has started to improve from 8.9  to 9.2 today. Will need to follow that as outpatient as well.   DISPOSITION: She is in stable condition to be discharged to [facility] for further rehab. Will need physical therapy and nursing care at that time.   FOLLOW-UP: Follow-up with Dr. Rudene Christians in eight days and follow-up with Dr. Netty Starring as needed upon discharge.   ____________________________ Dion Body, MD kl:drc D: 01/27/2012 08:06:05 ET T: 01/27/2012 09:09:03 ET JOB#: [id number]  cc: Dion Body, MD, <Dictator> Dion Body MD ELECTRONICALLY SIGNED 01/29/2012 17:31

## 2014-12-23 NOTE — Consult Note (Signed)
PATIENT NAME:  Andrea Griffin, Andrea Griffin MR#:  [id number] DATE OF BIRTH:  1932/06/26  DATE OF CONSULTATION:  03/03/2012  REFERRING PHYSICIAN:  Dr Netty Starring CONSULTING PHYSICIAN:  Heinz Knuckles. Deep Bonawitz, MD  REASON FOR CONSULTATION: Pseudomonas urinary tract infection and enterococcus infected hematoma.   HISTORY OF PRESENT ILLNESS: The patient is a 81 year old white female with a past history significant for recent right hip fracture, status post ORIF on 01/21/2012 whose course was complicated by the development of an infected hematoma. She underwent debridement on 02/18/2012, and enterococcus was recovered. She has a penicillin allergy and was given vancomycin via a PICC line. She was at [facility] and developed increased urinary frequency. A urine culture was obtained on 02/24/2012 which grew pansensitive pseudomonas. Her urinalysis at that time had positive nitrites, 1+ leukocyte esterase, and 52 white cells and was significantly worse compared to a June 5 UA. She was started on IV therapy for this as well. At the time of admission she was on vancomycin and aztreonam. She was sent from [facility] to the hospital because her temperature went up to 99.5, and she was feeling somewhat poorly. She states that her appetite has been poor since being on the antibiotics, and she also had some nausea. She denies any temperatures higher than 99.5 and has had no chills or sweats. She has had continued pain in the right hip although she has been ambulating with PT. She is currently on vancomycin and aztreonam.   ALLERGIES: Amoxicillin, Cipro, erythromycin, ranitidine, and sulfa.   PAST MEDICAL HISTORY:  1. Hypertension.  2. Recent hip fracture status post ORIF complicated by enterococcal infected hematoma and a pseudomonas urinary tract infection.  3. Recurrent urinary tract infections.  4. Left hip fracture status post ORIF without complication.   FAMILY HISTORY: Positive for hypertension, coronary artery disease, and  leukemia.   SOCIAL HISTORY: She is currently living in a skilled nursing facility but previously was living with her husband. She does not smoke. She does not drink. No injecting drug use history.   REVIEW OF SYSTEMS: GENERAL: Positive low-grade temperature elevation from her baseline. No chills, no sweats. Positive malaise. HEENT: No headaches. No sinus congestion. No sore throat. NECK: No stiffness. No swollen glands. RESPIRATORY: No cough, no shortness of breath. No sputum production. CARDIAC: No chest pains or palpitations. GI: Positive nausea, no vomiting. Positive anorexia. No abdominal pain, no change in her bowels.  GENITOURINARY: She has had some increased frequency previously, but this is somewhat improved. She has not had any dysuria. No flank pain. MUSCULOSKELETAL: She has pain in the right hip at the wound site. She is able to ambulate but has some discomfort. The left hip is fine and no other joints are bothering her. SKIN: She has serosanguineous drainage from the right hip wound. She had a wound VAC placed yesterday. She denies any other rashes. NEUROLOGIC: No focal weakness. PSYCHIATRIC: No complaints. All other systems are negative.   PHYSICAL EXAMINATION:  VITALS: Temperature of 99.1, pulse 90, blood pressure 184/70, 97% on room air.   GENERAL: A 81 year old white female in no acute distress.   HEENT: Normocephalic, atraumatic. Pupils equal, reactive to light. Extraocular motion intact. Sclerae, conjunctivae, and lids are without evidence for emboli or petechiae. Oropharynx shows no erythema or exudate. Teeth and gums are in fair condition.   NECK: Supple. Full range of motion. Midline trachea. No lymphadenopathy. No thyromegaly.   LUNGS: Clear to auscultation bilaterally with good air movement. No focal consolidation.   HEART:  Regular rate and rhythm without murmur, rub, or gallop.   ABDOMEN: Soft, nontender, nondistended. No hepatosplenomegaly. No hernia is noted.    EXTREMITIES: No evidence for tenosynovitis. She had good range of motion of her right hip without pain.   SKIN: There is a wound VAC in place on the right. Her wound on the right hip was somewhat tender to palpation. There is minimal erythema and no significant drainage although there was some serosanguineous drainage in the wound VAC itself. There were no stigmata of endocarditis, specifically no Janeway lesions or Osler nodes.   NEUROLOGIC: The patient was awake and interactive, moving all four extremities.   PSYCHIATRIC: Mood and affect appeared normal.   LABORATORY DATA: BUN of 5, creatinine 0.67, bicarbonate of 27, anion gap of 9. LFTs were unremarkable. White count was 7.5 with a hemoglobin of 10.7, platelet count of 537,000.  ANC of 5.3. Sedimentation rate was 64. Her white count on admission was 10.1 last night with an ANC of 7.8. Her white count on 02/02/2012 was 11.5 with an [place] of 8.7. A urinalysis from admission shows negative nitrites, negative leukocyte esterase, less than 1 red cell and 4 white cells per high-powered field. Her urinalysis on 02/24/2012 had positive nitrites, 1+ leukocyte esterase, 4 red cells, and 52 white cells per high-powered field; and urine culture grew greater than 100,000 CFUs per mL of pseudomonas. A June 20 wound culture is growing enterococcus that was sensitive to ampicillin and linezolid.   IMPRESSION: A 81 year old white female with a past history significant for recent right hip fracture, status post open reduction internal fixation, complicated by infected hematoma with enterococcus and pseudomonas urinary tract infection who was admitted with low-grade temperature elevation and malaise.   RECOMMENDATIONS:  1. Give her allergies, she was treated for both the infected hematoma and the urinary tract infection with IV therapy via PICC line. The PICC is currently not functioning.  2. Her urinalysis shows no evidence for inflammation. It may be possible to  discontinue therapy for the urinary tract infection. We will give her another day or two of the aztreonam.  3. Dr. Rudene Christians is planning an incision and drainage of the wound at the bedside and obtain cultures. We will continue the vancomycin until these cultures return. We may be able to change to oral linezolid upon discharge.  4. Would follow her fever curve and white blood count.   This is a moderately complex infectious disease case. Thank you very much for involving me in Ms. Crosley's care.    ____________________________ Heinz Knuckles. Alphonse Asbridge, MD meb:vtd D: 03/03/2012 10:57:37 ET T: 03/04/2012 10:32:49 ET JOB#: [id number] cc: Heinz Knuckles. Keyshawna Prouse, MD, <Dictator> Jerimey Burridge E Dalton Mille MD ELECTRONICALLY SIGNED 03/08/2012 12:09

## 2014-12-23 NOTE — Op Note (Signed)
PATIENT NAME:  Andrea Griffin, Andrea Griffin MR#:  [id number] DATE OF BIRTH:  05/16/1932  DATE OF PROCEDURE:  02/26/2012  PREOPERATIVE DIAGNOSES:   1. Right hip hematoma/abscess.  2. Lack of appropriate IV access.   POSTOPERATIVE DIAGNOSES:  1. Right hip hematoma/abscess.  2. Lack of appropriate IV access.   PROCEDURES:  1. Ultrasound guidance for vascular access to the basilic vein, left arm.  2. Fluoroscopic guidance for placement of catheter.  3. Insertion of peripherally inserted central venous catheter, left arm.  SURGEON: Katha Cabal, M.D.  ANESTHESIA: Local.   ESTIMATED BLOOD LOSS: Minimal.   INDICATION FOR PROCEDURE: Requirement for IV antibiotics greater than five days.   DESCRIPTION OF PROCEDURE: The patient's left arm was sterilely prepped and draped, and a sterile surgical field was created. The basilic vein was accessed under direct ultrasound guidance without difficulty with a micropuncture needle and permanent image was recorded. 0.018 wire was then placed into the superior vena cava. Peel-away sheath was placed over the wire. A single lumen peripherally inserted central venous catheter was then placed over the wire and the wire and peel-away sheath were removed. The catheter tip was placed into the superior vena cava and was secured at the skin at 42 cm with a sterile dressing. The catheter withdrew blood well and flushed easily with heparinized saline. The patient tolerated procedure well. ____________________________ Katha Cabal, MD ggs:ap D: 02/26/2012 18:44:04 ET                T: 02/27/2012 09:40:31 ET                  JOB#: [id number] cc: Katha Cabal, MD, <Dictator> Katha Cabal MD ELECTRONICALLY SIGNED 02/27/2012 12:52

## 2014-12-23 NOTE — Op Note (Signed)
PATIENT NAME:  Andrea Griffin, Andrea Griffin MR#:  [id number] DATE OF BIRTH:  [birth year]  DATE OF PROCEDURE:  01/21/2012  PREOPERATIVE DIAGNOSIS: Right femoral neck fracture.   POSTOPERATIVE DIAGNOSIS: Right femoral neck fracture.   PROCEDURE: Right hip hemiarthroplasty.    SURGEON: Laurene Footman, MD   ANESTHESIA:  Spinal.  DESCRIPTION OF PROCEDURE: The patient was brought to the operating room and after adequate anesthesia was obtained the patient was placed in the lateral position on the pegboard to maintain lateral position. The hip was then prepped and draped in the usual sterile fashion. After patient identification and time-out procedures were completed, posterior approach was made with incision centered over the greater trochanter. The IT band was then incised and a deep Charnley retractor placed. With internal rotation, the short external rotators were detached and the capsule opened. The fracture was displaced. After exposing the neck, a femoral neck cut was carried out and then the head was removed. It was measured and  49 appeared to be the appropriate size. Trial also fit this as well. At this point, the femur was approached with a box osteotome followed by starter reamer followed by broaching. The broach went to a 7. A cement plug sizer was placed and a #3 fit appropriately. This was inserted down the canal. The canal was then irrigated and dried as the cement was mixed. Cement was inserted down the canal and an ODC fracture hip stem size 6 C-taper head was inserted in the appropriate amount of anteversion. After the cement had set and excess cement had been removed, the +0 26-mm head was attached followed by the 49-mm universal head bipolar component. The hip was then reduced and interposed capsule was removed and the capsule repaired using 0 Ethibond. The gluteal fascia was repaired using a heavy quill suture, 2-0 quill subcutaneously followed by skin staples. Xeroform, 4 x 4's, ABD, and tape were  applied along with an abduction pillow. The patient was then transferred to the recovery room in stable condition.   ESTIMATED BLOOD LOSS: 400 mL.   COMPLICATIONS: None.   SPECIMEN: Removed femoral head which had minimal degenerative arthritis and the acetabulum appeared relatively normal.  IMPLANTS: Stryker ODC fracture hip stem size 6 C-taper, C-taper  26-mm diameter +0 offset, a size 3 cement plug, and a 49-mm outer diameter, 26-mm internal diameter bipolar head component.     ____________________________ Laurene Footman, MD mjm:bjt D: 01/21/2012 21:01:34 ET T: 01/22/2012 10:41:27 ET JOB#: [id number]  cc: Laurene Footman, MD, <Dictator> Laurene Footman MD ELECTRONICALLY SIGNED 01/22/2012 12:34

## 2014-12-23 NOTE — H&P (Signed)
PATIENT NAME:  Andrea Griffin, Andrea Griffin MR#:  [id number] DATE OF BIRTH:  1932/08/17  DATE OF ADMISSION:  01/20/2012  CHIEF COMPLAINT: Right hip pain.   HISTORY OF PRESENT ILLNESS: The patient is a 81 year old who fell while getting out of bed this morning. She came to the Acute Care at [facility], had x-rays that showed what appeared to be a possibly impacted femoral neck fracture. Subsequent CT confirmed this subcapital with some valgus impaction with the fracture going up to the lateral head. She is being admitted for treatment of this. 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 She has not needed assistive devices although she does fatigue because of some weakness on the left hip.   PAST MEDICAL HISTORY:  1. Hypothyroidism.  2. Hyperlipidemia.  3. Reflux. 4. Asthma. 5. Hypertension. 6. Obesity. 7. Anxiety.   PAST SURGICAL HISTORY:  1. Hysterectomy. 2. Bladder surgery for recurrent cystitis. 3. Cholecystectomy.  4. Prior left hip hemiarthroplasty for a femoral neck fracture. She has had some persistent limp since that surgery.   ADMISSION MEDICATIONS:  1. Lipitor 20 mg daily.  2. Levothyroxine 50 mcg p.o. daily.  3. Advair Diskus 100/50, 1 puff b.i.d.  4. Flonase spray, 2 sprays into both nostrils every night.  5. Fexofenadine 180 mg p.o. daily.  6. Aspirin 81 mg aspirin daily.  7. Calcium with vitamin D daily.  8. Ibuprofen 200, 3 pills per day b.i.d. as needed for pain.  9. Prilosec 20 mg daily.  10. Tylenol p.r.n.   ALLERGIES: Sulfa, Cipro, Macrodantin, ranitidine, amoxicillin.   FAMILY HISTORY: Positive for her father dying at age 75 with heart disease and leukemia. Mother died at age 21.   SOCIAL HISTORY: She is married, lives with her husband. She is retired. She is a nondrinker, nonsmoker. No drug use.   REVIEW OF SYSTEMS: Negative for chest pain, shortness of breath, loss of consciousness or other acute process, only complaint is the hip.    PHYSICAL EXAMINATION:  GENERAL: A well-developed, well-nourished white female who appears her stated age. She has some anxiety with right hip pain.   HEENT: Normal.   LUNGS: Clear.   HEART: Regular rate and rhythm. No murmur noted.   ABDOMEN: Soft, nontender.   EXTREMITIES: Exam was remarkable for some edema in both lower extremities, symmetric. She does have trace dorsalis pedis pulse on the right. No rotatory deformity. She is able to flex and extend the toes and sensation is intact. Skin around the hip is normal.   CLINICAL IMPRESSION: Right femoral neck fracture.   PLAN: Obtain preoperative medical evaluation and plan for a right hip hemiarthroplasty tomorrow. The risks, benefits, and possible complications were discussed. She has done this before, and she understands. We did discuss the option of hip pinning. She and her husband did   not want to risk an operation that might not be definitive, therefore, elected to proceed with a hip hemiarthroplasty. We will proceed with  surgery tomorrow.   ____________________________ Laurene Footman, MD mjm:cbb D: 01/20/2012 17:45:51 ET T: 01/20/2012 18:13:44 ET JOB#: [id number]  cc: Laurene Footman, MD, <Dictator> Laurene Footman MD ELECTRONICALLY SIGNED 01/21/2012 10:20

## 2014-12-23 NOTE — Consult Note (Signed)
PATIENT NAME:  Andrea Griffin, Andrea Griffin MR#:  [id number] DATE OF BIRTH:  [birth year]  DATE OF CONSULTATION:  01/20/2012  REFERRING PHYSICIAN:  Hessie Knows, MD CONSULTING PHYSICIAN:  Romie Jumper, MD  PRIMARY CARE PHYSICIAN: Dion Body, MD  REASON FOR CONSULTATION: Preoperative medical evaluation.   HISTORY OF PRESENT ILLNESS: The patient is a pleasant 81 year old female with past medical history AS listed below who unfortunately sustained an acute right hip fracture after she fell earlier today. She states that around 5 o'clock  p.m. she was getting out of her bed and she lost her balance and thereafter fell and landed on her right hip and subsequently developed severe right hip pain for which she came to the ER and by CT scan was noted to have sustained an acute impacted angulated subcapital fracture of the right hip. Thereafter she was subsequently admitted under the orthopedic service. Tentative plan is for her to undergo surgical repair of her hip fracture pending medical evaluation. Hospitalist services were contacted to perform preoperative medical evaluation. Earlier the patient had severe right hip pain, 10/10, but at this time, after receiving pain medications, both intravenously and orally, she states she is comfortable and denies any active hip pain. She tells me that she is pretty active and denies experiencing any chest pain or shortness of breath with exertion at baseline. She has no prior cardiac history.   Otherwise she is without any specific complaints at this time.   PAST SURGICAL HISTORY:  1. Hysterectomy. 2. Cholecystectomy. 3. Bladder stem dilatation for recurrence of cystitis. 4. EGD and colonoscopy in August 2008.  5. Bone marrow biopsy in August 2008 with pathology revealing slightly hypercellular bone marrow.  6. Surgery for left femoral neck fracture in November 2010.      PAST MEDICAL HISTORY:   1. Hypertension. 2. Hyperlipidemia. 3. Hypothyroidism. 4. Asthma. 5. Recurrent cystitis. 6. Anemia. 7. Mild duodenitis on EGD in August 2010. 8. Internal hemorrhoids. 9. Sigmoid diverticulosis see on colonoscopy April 2008. The patient had heme-negative stools at that time.  10. Gastroesophageal reflux disease. 11. Spinal stenosis.  12. Left hip fracture after a fall in November 2010 status post surgery.   ALLERGIES: Sulfa, Cipro, Zantac, ranitidine, erythromycin, amoxicillin.   HOME MEDICATIONS:  1. Synthroid 50 mcg daily. 2. Prilosec 10 mg daily. 3. Lipitor 20 mg daily. 4. Potassium chloride 20 mEq daily. 5.  Flonase 2 puffs inhaled daily.  6. Benicar 40 mg daily.  7. Allegra 180 mg daily.  8. Advair Diskus 100/50 one puff inhaled twice a day.  9. Tylenol p.r.n.   FAMILY HISTORY: Father died of leukemia and coronary artery disease at age 60.   SOCIAL HISTORY: Negative for tobacco, alcohol or illicit drug use. The patient lives at home with her husband.   REVIEW OF SYSTEMS: CONSTITUTIONAL: Denies fevers, chills or recent changes in weight or weakness. Had some hip pain earlier which is better controlled with pain medications. HEAD/EYES: Denies headache or blurry vision. ENT: Denies tinnitus, earache, nasal discharge, or sore throat. Reports dry mouth. RESPIRATORY: Denies shortness breath, cough, or wheezing. CARDIOVASCULAR: Denies chest pain, heart palpitations, or lower extremity edema. GASTROINTESTINAL: Denies nausea, vomiting, diarrhea, constipation, melena, hematochezia, or abdominal pain. GENITOURINARY: Denies dysuria or hematuria. ENDOCRINE: Denies heat or cold intolerance. HEME/LYMPH: Denies easy bruising or bleeding. INTEGUMENT: Denies rash or lesions. MUSCULOSKELETAL: Had some right hip pain earlier which is severe now better controlled after pain medication. Denies muscle weakness. Denies back pain. NEURO: Denies headache, numbness, weakness, tingling,  or dysarthria.  PSYCHIATRIC: Denies depression or anxiety.   PHYSICAL EXAMINATION:   VITAL SIGNS: Temperature 98, pulse 87, blood pressure 158/77, respirations 20, and oxygen saturation 98% on room air.   GENERAL: Pleasant female, alert and oriented, not acutely distressed.   HEENT: Normocephalic, atraumatic. Pupils are equally round and reactive to light. Extraocular movements are intact. Anicteric sclerae. Conjunctivae pink. Hearing intact to voice. Nares without drainage. Oral mucous is extremely dry but without any lesions noted.   NECK: Supple with full range of motion. No jugular venous distention, lymphadenopathy, or carotid bruits bilaterally. No thyromegaly or tenderness to palpation over the thyroid gland.   CHEST: Normal respiratory effort without use of accessory muscles. Lungs are clear to auscultation bilaterally without crackles, rales, or wheezes.   CARDIOVASCULAR: S1 and S2 positive, regular rate and rhythm. No murmurs, rubs, or gallops. PMI is non-lateralized.   ABDOMEN: Soft, nontender, and nondistended. Normoactive bowel sounds. No hepatosplenomegaly or palpable masses. No hernia.   EXTREMITIES: Right lower extremity is immobilized. No edema noted. Left lower extremity without any edema. There is no clubbing or cyanosis. Pedal pulses are palpable bilaterally.   SKIN: No suspicious rashes. Skin turgor is somewhat diminished.   LYMPH: No cervical lymphadenopathy.   NEURO: Alert and oriented x3. Cranial nerves II through XII grossly intact. No focal deficits. Motor strength is not tested of the right lower extremity due to her hip fracture. Otherwise examination is nonfocal.   PSYCH: Pleasant female with appropriate affect.   LABS/STUDIES: EKG: Normal sinus rhythm, heart rate 78 beats per minute, normal axis, normal intervals, lateral T wave inversions and was also noted to have T wave inversions but only in lead aVL on November 2010 EKG. Now she also has new T wave inversion in lead I.  No ST elevation or depression.   CBC within normal limits except for WBC 12.8.   BMP: Sodium 127, potassium 3.4, chloride 91, bicarbonate 27, BUN 12, creatinine 0.62, glucose 119, calcium 8.8, and anion gap 9.   INR 0.9.  CT of the right hip without contrast: The patient has sustained acute impacted angulated subcapital fracture of the right hip.   ASSESSMENT AND PLAN: A 81 year old female with past medical history of hypertension, hyperlipidemia, hypothyroidism, asthma, and gastroesophageal reflux disease with spinal stenosis and recurrent cystitis here with right hip fracture after a fall with:  1. Preoperative medical evaluation - the patient will be considered low risk for intermediate risk surgery. She has no prior cardiac history and states she is active and does not experience any exertional cardiac symptoms such as chest pain or shortness of breath. Therefore recommend proceeding with surgery as planned and reduce risk as much as possible and provide supportive care. She does have some EKG changes, as mentioned above, with lateral T wave inversions and states she has had a Myoview as an outpatient (exact date is unknown but [facility] will have records of this and the patient will be seen by Dr. Netty Starring in the morning). We will hold aspirin in the operative setting to reduce bleeding complications and restart aspirin as soon as possible postoperatively.  2. Hyponatremia - mild. This could be hypovolemic as she has very dry mucous membranes  which were noted on physical exam as well as slightly diminished skin turgor. Agree with IV hydration. For now we will follow sodium level closely with IV fluids. Also check TSH and she has a history of hypothyroidism. 3. Leukocytosis - mild, suspect stress induced.  The patient is afebrile. We will check a urinalysis.  Otherwise follow WBC count closely. Of note, she has been started on antibiotic therapy preoperatively by the  orthopedics team and is currently on clindamycin.  4. Hypokalemia - mild. She is on a potassium supplement at home. We will give 20 mEq of oral potassium chloride and then recheck level in the a.m.  5. Hypertension - blood pressure was noted to be elevated earlier, likely pain induced. Would continue pain control as you are doing and write for her Benicar and we will also write for p.r.n. IV labetalol and monitor blood pressure closely.  6. Hypothyroidism - continue Synthroid, check TSH and adjust accordingly.  7. Asthma - stable and without acute exacerbation. Continue Advair.  8. Gastroesophageal reflux disease - continue PPI.  9. Hyperlipidemia - continue Lipitor.  10. Right hip fracture post fall - with tentative plan for surgical repair per orthopedics and recommend proceeding with surgery as above.  11. Deep vein thrombosis prophylaxis - per ortho team.  CODE STATUS: FULL CODE.   Thank you for this consultation and for allowing me to participate in Ms. Pike's care. The patient will be followed by [facility] and I had a chance to speak with Dr. Dion Body who will be seeing the patient in the morning.   TIME SPENT ON CONSULTATION: Approximately 45 minutes. ____________________________ Romie Jumper, MD knl:slb D: 01/20/2012 23:28:43 ET T: 01/21/2012 12:31:27 ET JOB#: [id number]  cc: Romie Jumper, MD, <Dictator> Laurene Footman, MD Dion Body, MD Romie Jumper MD ELECTRONICALLY SIGNED 02/08/2012 19:38

## 2015-02-12 ENCOUNTER — Other Ambulatory Visit: Payer: Self-pay | Admitting: Orthopedic Surgery

## 2015-02-12 DIAGNOSIS — M48061 Spinal stenosis, lumbar region without neurogenic claudication: Secondary | ICD-10-CM

## 2015-02-19 ENCOUNTER — Ambulatory Visit
Admission: RE | Admit: 2015-02-19 | Discharge: 2015-02-19 | Disposition: A | Discharge status: Home / Self Care | Payer: Medicare Other | Source: Ambulatory Visit | Attending: Orthopedic Surgery | Admitting: Orthopedic Surgery

## 2015-02-19 DIAGNOSIS — M4606 Spinal enthesopathy, lumbar region: Secondary | ICD-10-CM | POA: Diagnosis not present

## 2015-02-19 DIAGNOSIS — M4806 Spinal stenosis, lumbar region: Secondary | ICD-10-CM | POA: Insufficient documentation

## 2015-02-19 DIAGNOSIS — M4316 Spondylolisthesis, lumbar region: Secondary | ICD-10-CM | POA: Insufficient documentation

## 2015-02-19 DIAGNOSIS — M545 Low back pain: Secondary | ICD-10-CM | POA: Diagnosis present

## 2015-02-19 DIAGNOSIS — M5126 Other intervertebral disc displacement, lumbar region: Secondary | ICD-10-CM | POA: Insufficient documentation

## 2015-02-19 DIAGNOSIS — M48061 Spinal stenosis, lumbar region without neurogenic claudication: Secondary | ICD-10-CM

## 2015-02-19 DIAGNOSIS — R531 Weakness: Secondary | ICD-10-CM | POA: Diagnosis present

## 2015-12-30 ENCOUNTER — Other Ambulatory Visit: Payer: Self-pay | Admitting: Neurological Surgery

## 2016-01-14 ENCOUNTER — Encounter (HOSPITAL_COMMUNITY): Payer: Self-pay

## 2016-01-14 ENCOUNTER — Encounter (HOSPITAL_COMMUNITY)
Admission: RE | Admit: 2016-01-14 | Discharge: 2016-01-14 | Disposition: A | Discharge status: Home / Self Care | Payer: Medicare Other | Source: Ambulatory Visit | Attending: Neurological Surgery | Admitting: Neurological Surgery

## 2016-01-14 DIAGNOSIS — E785 Hyperlipidemia, unspecified: Secondary | ICD-10-CM | POA: Diagnosis not present

## 2016-01-14 DIAGNOSIS — E079 Disorder of thyroid, unspecified: Secondary | ICD-10-CM | POA: Insufficient documentation

## 2016-01-14 DIAGNOSIS — J45909 Unspecified asthma, uncomplicated: Secondary | ICD-10-CM | POA: Diagnosis not present

## 2016-01-14 DIAGNOSIS — K219 Gastro-esophageal reflux disease without esophagitis: Secondary | ICD-10-CM | POA: Insufficient documentation

## 2016-01-14 DIAGNOSIS — Z01812 Encounter for preprocedural laboratory examination: Secondary | ICD-10-CM | POA: Insufficient documentation

## 2016-01-14 DIAGNOSIS — I10 Essential (primary) hypertension: Secondary | ICD-10-CM | POA: Diagnosis not present

## 2016-01-14 DIAGNOSIS — R9431 Abnormal electrocardiogram [ECG] [EKG]: Secondary | ICD-10-CM | POA: Diagnosis not present

## 2016-01-14 DIAGNOSIS — Z79899 Other long term (current) drug therapy: Secondary | ICD-10-CM | POA: Insufficient documentation

## 2016-01-14 DIAGNOSIS — Z85820 Personal history of malignant melanoma of skin: Secondary | ICD-10-CM | POA: Diagnosis not present

## 2016-01-14 DIAGNOSIS — Z01818 Encounter for other preprocedural examination: Secondary | ICD-10-CM | POA: Insufficient documentation

## 2016-01-14 DIAGNOSIS — M4316 Spondylolisthesis, lumbar region: Secondary | ICD-10-CM | POA: Insufficient documentation

## 2016-01-14 DIAGNOSIS — Z0183 Encounter for blood typing: Secondary | ICD-10-CM | POA: Diagnosis not present

## 2016-01-14 HISTORY — DX: Hyperlipidemia, unspecified: E78.5

## 2016-01-14 HISTORY — DX: Presence of unspecified artificial hip joint: Z96.649

## 2016-01-14 HISTORY — DX: Presence of spectacles and contact lenses: Z97.3

## 2016-01-14 HISTORY — DX: Urgency of urination: R39.15

## 2016-01-14 HISTORY — DX: Unspecified osteoarthritis, unspecified site: M19.90

## 2016-01-14 HISTORY — DX: Other specified disorders of bone density and structure, unspecified site: M85.80

## 2016-01-14 HISTORY — DX: Infection and inflammatory reaction due to other internal joint prosthesis, initial encounter: T84.59XA

## 2016-01-14 HISTORY — DX: Anxiety disorder, unspecified: F41.9

## 2016-01-14 HISTORY — DX: Malignant (primary) neoplasm, unspecified: C80.1

## 2016-01-14 HISTORY — DX: Unspecified asthma, uncomplicated: J45.909

## 2016-01-14 HISTORY — DX: Obesity, unspecified: E66.9

## 2016-01-14 HISTORY — DX: Personal history of urinary (tract) infections: Z87.440

## 2016-01-14 HISTORY — DX: Essential (primary) hypertension: I10

## 2016-01-14 HISTORY — DX: Gastro-esophageal reflux disease without esophagitis: K21.9

## 2016-01-14 HISTORY — DX: Frequency of micturition: R35.0

## 2016-01-14 HISTORY — DX: Disorder of thyroid, unspecified: E07.9

## 2016-01-14 HISTORY — DX: Nocturia: R35.1

## 2016-01-14 HISTORY — DX: Unspecified urinary incontinence: R32

## 2016-01-14 LAB — CBC
HCT: 41.6 % (ref 36.0–46.0)
HEMOGLOBIN: 14.4 g/dL (ref 12.0–15.0)
MCH: 33 pg (ref 26.0–34.0)
MCHC: 34.6 g/dL (ref 30.0–36.0)
MCV: 95.2 fL (ref 78.0–100.0)
Platelets: 306 10*3/uL (ref 150–400)
RBC: 4.37 MIL/uL (ref 3.87–5.11)
RDW: 12.6 % (ref 11.5–15.5)
WBC: 7.4 10*3/uL (ref 4.0–10.5)

## 2016-01-14 LAB — BASIC METABOLIC PANEL
Anion gap: 9 (ref 5–15)
BUN: 12 mg/dL (ref 6–20)
CHLORIDE: 98 mmol/L — AB (ref 101–111)
CO2: 26 mmol/L (ref 22–32)
Calcium: 9.5 mg/dL (ref 8.9–10.3)
Creatinine, Ser: 0.67 mg/dL (ref 0.44–1.00)
GFR calc non Af Amer: >60 mL/min (ref >60)
Glucose, Bld: 96 mg/dL (ref 65–99)
POTASSIUM: 4.1 mmol/L (ref 3.5–5.1)
SODIUM: 133 mmol/L — AB (ref 135–145)

## 2016-01-14 LAB — TYPE AND SCREEN
ABO/RH(D): A POS
ANTIBODY SCREEN: NEGATIVE

## 2016-01-14 LAB — SURGICAL PCR SCREEN
MRSA, PCR: POSITIVE — AB
Staphylococcus aureus: POSITIVE — AB

## 2016-01-14 LAB — ABO/RH: ABO/RH(D): A POS

## 2016-01-14 NOTE — Progress Notes (Signed)
Patient made aware about positive PCR results, [facility]  Patient verbalizes understanding.

## 2016-01-14 NOTE — Progress Notes (Signed)
PCP - Dr. Elbert Ewings at [facility] Cardiologist - has seen Dr. Sherril Cong in the past but does not see him on a regular basis  EKG - 01/14/16 CXR - denies  Echo-denies Stress test - > 10 years ago Cardiac Cath - > 20 years ago  Patient denies chest pain and shortness of breath at PAT appointment.

## 2016-01-14 NOTE — Pre-Procedure Instructions (Signed)
    Andrea Griffin  01/14/2016      [facility] [place], [place] - [place] [address] Phone: [phone] Fax: [phone]    Your procedure is scheduled on Tuesday, May 23rd, 2017.  Report to [facility] Admitting at 7:00 A.M.   Call this number if you have problems the morning of surgery:  [phone]   Remember:  Do not eat food or drink liquids after midnight.   Take these medicines the morning of surgery with A SIP OF WATER: Acetaminophen (Tylenol) if needed, Amlodipine (Norvasc), Fexofenadine (Allegra), Fluticasone (Flonase), Advair inhaler, Levothyroxine (Synthroid), Omeprazole (Prilosec).    Stop taking: Diclofenac Sodium (Voltaren), Aspirin, NSAIDS, Aleve, Naproxen, Ibuprofen, Advil, Motrin, BC's, Goody's, Fish oil, all herbal medications, and all vitamins.    Do not wear jewelry, make-up or nail polish.  Do not wear lotions, powders, or perfumes.  You may NOT wear deodorant.  Do not shave 48 hours prior to surgery.   Do not bring valuables to the hospital.   [facility] is not responsible for any belongings or valuables.  Contacts, dentures or bridgework may not be worn into surgery.  Leave your suitcase in the car.  After surgery it may be brought to your room.  For patients admitted to the hospital, discharge time will be determined by your treatment team.  Patients discharged the day of surgery will not be allowed to drive home.   Special instructions:  See attached.   Please read over the following fact sheets that you were given. Pain Booklet, Coughing and Deep Breathing, MRSA Information and Surgical Site Infection Prevention

## 2016-01-14 NOTE — Progress Notes (Signed)
   01/14/16 1038  OBSTRUCTIVE SLEEP APNEA  Have you ever been diagnosed with sleep apnea through a sleep study? No  Do you snore loudly (loud enough to be heard through closed doors)?  0  Do you often feel tired, fatigued, or sleepy during the daytime (such as falling asleep during driving or talking to someone)? 0  Has anyone observed you stop breathing during your sleep? 1  Do you have, or are you being treated for high blood pressure? 1  BMI more than 35 kg/m2? 1  Age > 50 (1-yes) 1  Neck circumference greater than:Female 16 inches or larger, Female 17inches or larger? 1  Female Gender (Yes=1) 0  Obstructive Sleep Apnea Score 5

## 2016-01-15 NOTE — Progress Notes (Signed)
Anesthesia Chart Review:  Pt is an 82 year old female scheduled for L4-5 PLIF on 01/21/2016 with Dr. Ellene Route.   PCP is Dr. Dion Body (care everywhere).   PMH includes:  HTN, asthma, hyperlipidemia, melanoma, thyroid disease, GERD. Never smoker. BMI 36  Medications include: amlodipine, lipitor, advair, levothyroxine, losartan, prilosec  Preoperative labs reviewed.    EKG 01/14/16: NSR. ST & T wave abnormality, consider lateral ischemia. No significant change since 03/13/14 per Dr. Kennon Holter interpretation. T wave changes in I and AVL have been present on and off since 2013.   Reviewed case with Dr. Marcie Bal.   If no changes, I anticipate pt can proceed with surgery as scheduled.   Willeen Cass, FNP-BC [facility]/Anesthesiology Phone: [phone] 01/15/2016 4:28 PM

## 2016-01-20 MED ORDER — CEFAZOLIN SODIUM-DEXTROSE 2-4 GM/100ML-% IV SOLN
2.0000 g | INTRAVENOUS | Status: AC
  Administered 2016-01-21: 2 g via INTRAVENOUS
  Filled 2016-01-20: qty 100

## 2016-01-21 ENCOUNTER — Inpatient Hospital Stay (HOSPITAL_COMMUNITY)
Admission: RE | Admit: 2016-01-21 | Discharge: 2016-01-24 | DRG: 983 | Disposition: A | Discharge status: Skilled Nursing Facility | Payer: Medicare Other | Source: Ambulatory Visit | Attending: Neurological Surgery | Admitting: Neurological Surgery

## 2016-01-21 ENCOUNTER — Inpatient Hospital Stay (HOSPITAL_COMMUNITY): Payer: Medicare Other

## 2016-01-21 ENCOUNTER — Inpatient Hospital Stay (HOSPITAL_COMMUNITY): Payer: Medicare Other | Admitting: Anesthesiology

## 2016-01-21 ENCOUNTER — Encounter (HOSPITAL_COMMUNITY): Payer: Self-pay | Admitting: *Deleted

## 2016-01-21 ENCOUNTER — Inpatient Hospital Stay (HOSPITAL_COMMUNITY): Payer: Medicare Other | Admitting: Emergency Medicine

## 2016-01-21 ENCOUNTER — Encounter (HOSPITAL_COMMUNITY)
Admission: RE | Disposition: A | Discharge status: Skilled Nursing Facility | Payer: Self-pay | Source: Ambulatory Visit | Attending: Neurological Surgery

## 2016-01-21 DIAGNOSIS — M4806 Spinal stenosis, lumbar region: Secondary | ICD-10-CM | POA: Diagnosis present

## 2016-01-21 DIAGNOSIS — E785 Hyperlipidemia, unspecified: Secondary | ICD-10-CM | POA: Diagnosis present

## 2016-01-21 DIAGNOSIS — Z419 Encounter for procedure for purposes other than remedying health state, unspecified: Secondary | ICD-10-CM

## 2016-01-21 DIAGNOSIS — M4316 Spondylolisthesis, lumbar region: Secondary | ICD-10-CM | POA: Diagnosis present

## 2016-01-21 DIAGNOSIS — M5416 Radiculopathy, lumbar region: Secondary | ICD-10-CM | POA: Diagnosis present

## 2016-01-21 DIAGNOSIS — K219 Gastro-esophageal reflux disease without esophagitis: Secondary | ICD-10-CM | POA: Diagnosis present

## 2016-01-21 DIAGNOSIS — M79606 Pain in leg, unspecified: Secondary | ICD-10-CM | POA: Diagnosis present

## 2016-01-21 DIAGNOSIS — Z96643 Presence of artificial hip joint, bilateral: Secondary | ICD-10-CM | POA: Diagnosis present

## 2016-01-21 DIAGNOSIS — Z85820 Personal history of malignant melanoma of skin: Secondary | ICD-10-CM | POA: Diagnosis not present

## 2016-01-21 DIAGNOSIS — I10 Essential (primary) hypertension: Secondary | ICD-10-CM | POA: Diagnosis present

## 2016-01-21 DIAGNOSIS — Z7951 Long term (current) use of inhaled steroids: Secondary | ICD-10-CM | POA: Diagnosis not present

## 2016-01-21 HISTORY — PX: BACK SURGERY: SHX140

## 2016-01-21 HISTORY — PX: LUMBAR FUSION: SHX111

## 2016-01-21 SURGERY — POSTERIOR LUMBAR FUSION 1 LEVEL
Anesthesia: General | Site: Back

## 2016-01-21 MED ORDER — PROPOFOL 10 MG/ML IV BOLUS
INTRAVENOUS | Status: AC
  Filled 2016-01-21: qty 20

## 2016-01-21 MED ORDER — ATORVASTATIN CALCIUM 10 MG PO TABS
20.0000 mg | ORAL_TABLET | Freq: Every day | ORAL | Status: DC
  Administered 2016-01-22 – 2016-01-24 (×3): 20 mg via ORAL
  Filled 2016-01-21 (×3): qty 2

## 2016-01-21 MED ORDER — SODIUM CHLORIDE 0.9 % IV SOLN
INTRAVENOUS | Status: DC
  Administered 2016-01-21: 21:00:00 via INTRAVENOUS

## 2016-01-21 MED ORDER — ROCURONIUM BROMIDE 100 MG/10ML IV SOLN
INTRAVENOUS | Status: DC | PRN
  Administered 2016-01-21: 20 mg via INTRAVENOUS
  Administered 2016-01-21: 50 mg via INTRAVENOUS

## 2016-01-21 MED ORDER — LACTATED RINGERS IV SOLN
INTRAVENOUS | Status: DC
  Administered 2016-01-21 (×2): via INTRAVENOUS

## 2016-01-21 MED ORDER — FENTANYL CITRATE (PF) 250 MCG/5ML IJ SOLN
INTRAMUSCULAR | Status: AC
  Filled 2016-01-21: qty 5

## 2016-01-21 MED ORDER — METHOCARBAMOL 1000 MG/10ML IJ SOLN
500.0000 mg | Freq: Four times a day (QID) | INTRAVENOUS | Status: DC | PRN
  Filled 2016-01-21: qty 5

## 2016-01-21 MED ORDER — DEXAMETHASONE SODIUM PHOSPHATE 10 MG/ML IJ SOLN
INTRAMUSCULAR | Status: AC
  Filled 2016-01-21: qty 1

## 2016-01-21 MED ORDER — ROCURONIUM BROMIDE 50 MG/5ML IV SOLN
INTRAVENOUS | Status: AC
  Filled 2016-01-21: qty 1

## 2016-01-21 MED ORDER — AMLODIPINE BESYLATE 10 MG PO TABS
10.0000 mg | ORAL_TABLET | Freq: Every day | ORAL | Status: DC
  Administered 2016-01-22 – 2016-01-24 (×3): 10 mg via ORAL
  Filled 2016-01-21 (×3): qty 1

## 2016-01-21 MED ORDER — MOMETASONE FURO-FORMOTEROL FUM 100-5 MCG/ACT IN AERO
2.0000 | INHALATION_SPRAY | Freq: Two times a day (BID) | RESPIRATORY_TRACT | Status: DC
  Administered 2016-01-21 – 2016-01-24 (×6): 2 via RESPIRATORY_TRACT
  Filled 2016-01-21: qty 8.8

## 2016-01-21 MED ORDER — LIDOCAINE-EPINEPHRINE 1 %-1:100000 IJ SOLN
INTRAMUSCULAR | Status: DC | PRN
Start: 2016-01-21 — End: 2016-01-21
  Administered 2016-01-21: 10 mL

## 2016-01-21 MED ORDER — MENTHOL 3 MG MT LOZG: 1.0000 | LOZENGE | OROMUCOSAL | Status: DC | PRN

## 2016-01-21 MED ORDER — HYDROMORPHONE HCL 1 MG/ML IJ SOLN
INTRAMUSCULAR | Status: AC
  Filled 2016-01-21: qty 1

## 2016-01-21 MED ORDER — EPHEDRINE 5 MG/ML INJ
INTRAVENOUS | Status: AC
  Filled 2016-01-21: qty 10

## 2016-01-21 MED ORDER — SODIUM CHLORIDE 0.9% FLUSH
3.0000 mL | Freq: Two times a day (BID) | INTRAVENOUS | Status: DC
  Administered 2016-01-21 – 2016-01-23 (×4): 3 mL via INTRAVENOUS
  Administered 2016-01-23: 21:00:00 via INTRAVENOUS
  Administered 2016-01-24: 3 mL via INTRAVENOUS

## 2016-01-21 MED ORDER — BACITRACIN 50000 UNITS IM SOLR
INTRAMUSCULAR | Status: DC | PRN
  Administered 2016-01-21: 13:00:00

## 2016-01-21 MED ORDER — GLYCOPYRROLATE 0.2 MG/ML IV SOSY
PREFILLED_SYRINGE | INTRAVENOUS | Status: DC | PRN
  Administered 2016-01-21: 0.4 mg via INTRAVENOUS

## 2016-01-21 MED ORDER — CEFAZOLIN SODIUM 1-5 GM-% IV SOLN
1.0000 g | Freq: Three times a day (TID) | INTRAVENOUS | Status: AC
  Administered 2016-01-22: 1 g via INTRAVENOUS
  Filled 2016-01-21 (×3): qty 50

## 2016-01-21 MED ORDER — ONDANSETRON HCL 4 MG/2ML IJ SOLN
INTRAMUSCULAR | Status: DC | PRN
  Administered 2016-01-21: 4 mg via INTRAVENOUS

## 2016-01-21 MED ORDER — LEVOTHYROXINE SODIUM 50 MCG PO TABS
50.0000 ug | ORAL_TABLET | Freq: Every day | ORAL | Status: DC
  Administered 2016-01-22 – 2016-01-24 (×3): 50 ug via ORAL
  Filled 2016-01-21 (×3): qty 1

## 2016-01-21 MED ORDER — MAGNESIUM CITRATE PO SOLN: 1.0000 | Freq: Once | ORAL | Status: DC | PRN

## 2016-01-21 MED ORDER — DOCUSATE SODIUM 100 MG PO CAPS
100.0000 mg | ORAL_CAPSULE | Freq: Two times a day (BID) | ORAL | Status: DC
  Administered 2016-01-21 – 2016-01-24 (×6): 100 mg via ORAL
  Filled 2016-01-21 (×6): qty 1

## 2016-01-21 MED ORDER — THROMBIN 20000 UNITS EX SOLR
CUTANEOUS | Status: DC | PRN
  Administered 2016-01-21: 13:00:00 via TOPICAL

## 2016-01-21 MED ORDER — GLYCOPYRROLATE 0.2 MG/ML IV SOSY
PREFILLED_SYRINGE | INTRAVENOUS | Status: AC
  Filled 2016-01-21: qty 3

## 2016-01-21 MED ORDER — HYDROMORPHONE HCL 1 MG/ML IJ SOLN
0.2500 mg | INTRAMUSCULAR | Status: DC | PRN
  Administered 2016-01-21 (×2): 0.5 mg via INTRAVENOUS

## 2016-01-21 MED ORDER — ONDANSETRON HCL 4 MG/2ML IJ SOLN
INTRAMUSCULAR | Status: AC
  Filled 2016-01-21: qty 2

## 2016-01-21 MED ORDER — PROPOFOL 10 MG/ML IV BOLUS
INTRAVENOUS | Status: DC | PRN
  Administered 2016-01-21: 170 mg via INTRAVENOUS

## 2016-01-21 MED ORDER — ACETAMINOPHEN 325 MG PO TABS: 650.0000 mg | ORAL_TABLET | ORAL | Status: DC | PRN

## 2016-01-21 MED ORDER — PHENOL 1.4 % MT LIQD: 1.0000 | OROMUCOSAL | Status: DC | PRN

## 2016-01-21 MED ORDER — METHOCARBAMOL 500 MG PO TABS
500.0000 mg | ORAL_TABLET | Freq: Four times a day (QID) | ORAL | Status: DC | PRN
  Administered 2016-01-23 – 2016-01-24 (×6): 500 mg via ORAL
  Filled 2016-01-21 (×6): qty 1

## 2016-01-21 MED ORDER — ACETAMINOPHEN 650 MG RE SUPP: 650.0000 mg | RECTAL | Status: DC | PRN

## 2016-01-21 MED ORDER — SENNA 8.6 MG PO TABS
1.0000 | ORAL_TABLET | Freq: Two times a day (BID) | ORAL | Status: DC
Start: 2016-01-21 — End: 2016-01-24
  Administered 2016-01-21 – 2016-01-24 (×6): 8.6 mg via ORAL
  Filled 2016-01-21 (×6): qty 1

## 2016-01-21 MED ORDER — 0.9 % SODIUM CHLORIDE (POUR BTL) OPTIME
TOPICAL | Status: DC | PRN
  Administered 2016-01-21: 1000 mL

## 2016-01-21 MED ORDER — FENTANYL CITRATE (PF) 100 MCG/2ML IJ SOLN
INTRAMUSCULAR | Status: DC | PRN
  Administered 2016-01-21: 100 ug via INTRAVENOUS
  Administered 2016-01-21 (×6): 50 ug via INTRAVENOUS

## 2016-01-21 MED ORDER — BUPIVACAINE HCL (PF) 0.5 % IJ SOLN
INTRAMUSCULAR | Status: DC | PRN
Start: 2016-01-21 — End: 2016-01-21
  Administered 2016-01-21: 20 mL
  Administered 2016-01-21: 10 mL

## 2016-01-21 MED ORDER — NEOSTIGMINE METHYLSULFATE 5 MG/5ML IV SOSY
PREFILLED_SYRINGE | INTRAVENOUS | Status: DC | PRN
  Administered 2016-01-21: 3 mg via INTRAVENOUS

## 2016-01-21 MED ORDER — NEOSTIGMINE METHYLSULFATE 5 MG/5ML IV SOSY
PREFILLED_SYRINGE | INTRAVENOUS | Status: AC
  Filled 2016-01-21: qty 5

## 2016-01-21 MED ORDER — SODIUM CHLORIDE 0.9 % IV SOLN
10.0000 mg | INTRAVENOUS | Status: DC | PRN
  Administered 2016-01-21: 25 ug/min via INTRAVENOUS

## 2016-01-21 MED ORDER — ONDANSETRON HCL 4 MG/2ML IJ SOLN
4.0000 mg | INTRAMUSCULAR | Status: DC | PRN
  Administered 2016-01-21 – 2016-01-22 (×2): 4 mg via INTRAVENOUS
  Filled 2016-01-21 (×2): qty 2

## 2016-01-21 MED ORDER — THROMBIN 5000 UNITS EX SOLR
CUTANEOUS | Status: DC | PRN
  Administered 2016-01-21: 13:00:00 via TOPICAL

## 2016-01-21 MED ORDER — PANTOPRAZOLE SODIUM 40 MG PO TBEC
40.0000 mg | DELAYED_RELEASE_TABLET | Freq: Every day | ORAL | Status: DC
  Administered 2016-01-22 – 2016-01-24 (×3): 40 mg via ORAL
  Filled 2016-01-21 (×3): qty 1

## 2016-01-21 MED ORDER — ALBUMIN HUMAN 5 % IV SOLN
INTRAVENOUS | Status: DC | PRN
  Administered 2016-01-21: 13:00:00 via INTRAVENOUS

## 2016-01-21 MED ORDER — KETOROLAC TROMETHAMINE 15 MG/ML IJ SOLN
15.0000 mg | Freq: Four times a day (QID) | INTRAMUSCULAR | Status: AC
  Administered 2016-01-21 – 2016-01-22 (×4): 15 mg via INTRAVENOUS
  Filled 2016-01-21 (×4): qty 1

## 2016-01-21 MED ORDER — OXYCODONE-ACETAMINOPHEN 5-325 MG PO TABS
1.0000 | ORAL_TABLET | ORAL | Status: DC | PRN
  Administered 2016-01-21: 1 via ORAL
  Administered 2016-01-22 – 2016-01-23 (×4): 2 via ORAL
  Administered 2016-01-24: 1 via ORAL
  Administered 2016-01-24: 2 via ORAL
  Administered 2016-01-24: 1 via ORAL
  Filled 2016-01-21 (×2): qty 2
  Filled 2016-01-21: qty 1
  Filled 2016-01-21: qty 2
  Filled 2016-01-21 (×2): qty 1
  Filled 2016-01-21 (×2): qty 2

## 2016-01-21 MED ORDER — KETOROLAC TROMETHAMINE 15 MG/ML IJ SOLN
INTRAMUSCULAR | Status: AC
  Filled 2016-01-21: qty 1

## 2016-01-21 MED ORDER — LOSARTAN POTASSIUM 50 MG PO TABS
100.0000 mg | ORAL_TABLET | Freq: Every day | ORAL | Status: DC
  Administered 2016-01-22 – 2016-01-24 (×3): 100 mg via ORAL
  Filled 2016-01-21 (×3): qty 2

## 2016-01-21 MED ORDER — KETOROLAC TROMETHAMINE 15 MG/ML IJ SOLN
15.0000 mg | Freq: Once | INTRAMUSCULAR | Status: AC
  Administered 2016-01-21: 15 mg via INTRAVENOUS

## 2016-01-21 MED ORDER — SODIUM CHLORIDE 0.9% FLUSH: 3.0000 mL | INTRAVENOUS | Status: DC | PRN

## 2016-01-21 MED ORDER — MORPHINE SULFATE (PF) 2 MG/ML IV SOLN
1.0000 mg | INTRAVENOUS | Status: DC | PRN
  Administered 2016-01-22: 2 mg via INTRAVENOUS
  Administered 2016-01-22: 1 mg via INTRAVENOUS
  Administered 2016-01-23: 2 mg via INTRAVENOUS
  Administered 2016-01-24: 4 mg via INTRAVENOUS
  Filled 2016-01-21 (×2): qty 1
  Filled 2016-01-21: qty 2
  Filled 2016-01-21: qty 1

## 2016-01-21 MED ORDER — SODIUM CHLORIDE 0.9 % IV SOLN: 250.0000 mL | INTRAVENOUS | Status: DC

## 2016-01-21 MED ORDER — LORATADINE 10 MG PO TABS
10.0000 mg | ORAL_TABLET | Freq: Every day | ORAL | Status: DC
  Administered 2016-01-22 – 2016-01-24 (×3): 10 mg via ORAL
  Filled 2016-01-21 (×3): qty 1

## 2016-01-21 MED ORDER — LIDOCAINE 2% (20 MG/ML) 5 ML SYRINGE
INTRAMUSCULAR | Status: AC
  Filled 2016-01-21: qty 5

## 2016-01-21 MED ORDER — ALUM & MAG HYDROXIDE-SIMETH 200-200-20 MG/5ML PO SUSP: 30.0000 mL | Freq: Four times a day (QID) | ORAL | Status: DC | PRN

## 2016-01-21 MED ORDER — DEXAMETHASONE SODIUM PHOSPHATE 10 MG/ML IJ SOLN
INTRAMUSCULAR | Status: DC | PRN
  Administered 2016-01-21: 10 mg via INTRAVENOUS

## 2016-01-21 MED ORDER — HYDROCODONE-ACETAMINOPHEN 5-325 MG PO TABS
1.0000 | ORAL_TABLET | ORAL | Status: DC | PRN
  Administered 2016-01-22 – 2016-01-24 (×6): 2 via ORAL
  Filled 2016-01-21 (×6): qty 2

## 2016-01-21 MED ORDER — FLUTICASONE PROPIONATE 50 MCG/ACT NA SUSP
2.0000 | Freq: Every day | NASAL | Status: DC
  Administered 2016-01-24: 2 via NASAL
  Filled 2016-01-21: qty 16

## 2016-01-21 MED ORDER — POLYETHYLENE GLYCOL 3350 17 G PO PACK: 17.0000 g | PACK | Freq: Every day | ORAL | Status: DC | PRN

## 2016-01-21 MED ORDER — BISACODYL 10 MG RE SUPP: 10.0000 mg | Freq: Every day | RECTAL | Status: DC | PRN

## 2016-01-21 MED ORDER — LIDOCAINE HCL (CARDIAC) 20 MG/ML IV SOLN
INTRAVENOUS | Status: DC | PRN
  Administered 2016-01-21: 100 mg via INTRAVENOUS

## 2016-01-21 SURGICAL SUPPLY — 65 items
BAG DECANTER FOR FLEXI CONT (MISCELLANEOUS) ×3 IMPLANT
BLADE CLIPPER SURG (BLADE) IMPLANT
BONE EQUIVA 5CC (Bone Implant) ×3 IMPLANT
BUR MATCHSTICK NEURO 3.0 LAGG (BURR) ×3 IMPLANT
CANISTER SUCT 3000ML PPV (MISCELLANEOUS) ×3 IMPLANT
CONT SPEC 4OZ CLIKSEAL STRL BL (MISCELLANEOUS) ×3 IMPLANT
COVER BACK TABLE 60X90IN (DRAPES) ×3 IMPLANT
DECANTER SPIKE VIAL GLASS SM (MISCELLANEOUS) ×3 IMPLANT
DERMABOND ADVANCED (GAUZE/BANDAGES/DRESSINGS) ×2
DERMABOND ADVANCED .7 DNX12 (GAUZE/BANDAGES/DRESSINGS) ×1 IMPLANT
DEVICE DISSECT PLASMABLAD 3.0S (MISCELLANEOUS) ×1 IMPLANT
DRAPE C-ARM 42X72 X-RAY (DRAPES) ×6 IMPLANT
DRAPE LAPAROTOMY 100X72X124 (DRAPES) ×3 IMPLANT
DRAPE POUCH INSTRU U-SHP 10X18 (DRAPES) ×3 IMPLANT
DRAPE PROXIMA HALF (DRAPES) IMPLANT
DRSG OPSITE POSTOP 4X8 (GAUZE/BANDAGES/DRESSINGS) ×3 IMPLANT
DURAPREP 26ML APPLICATOR (WOUND CARE) ×3 IMPLANT
ELECT REM PT RETURN 9FT ADLT (ELECTROSURGICAL) ×3
ELECTRODE REM PT RTRN 9FT ADLT (ELECTROSURGICAL) ×1 IMPLANT
GAUZE SPONGE 4X4 12PLY STRL (GAUZE/BANDAGES/DRESSINGS) ×3 IMPLANT
GAUZE SPONGE 4X4 16PLY XRAY LF (GAUZE/BANDAGES/DRESSINGS) IMPLANT
GLOVE BIOGEL PI IND STRL 7.5 (GLOVE) ×1 IMPLANT
GLOVE BIOGEL PI IND STRL 8.5 (GLOVE) ×2 IMPLANT
GLOVE BIOGEL PI INDICATOR 7.5 (GLOVE) ×2
GLOVE BIOGEL PI INDICATOR 8.5 (GLOVE) ×4
GLOVE ECLIPSE 7.0 STRL STRAW (GLOVE) ×3 IMPLANT
GLOVE ECLIPSE 8.5 STRL (GLOVE) ×6 IMPLANT
GLOVE EXAM NITRILE LRG STRL (GLOVE) IMPLANT
GLOVE EXAM NITRILE MD LF STRL (GLOVE) IMPLANT
GLOVE EXAM NITRILE XL STR (GLOVE) IMPLANT
GLOVE EXAM NITRILE XS STR PU (GLOVE) IMPLANT
GOWN STRL REUS W/ TWL LRG LVL3 (GOWN DISPOSABLE) IMPLANT
GOWN STRL REUS W/ TWL XL LVL3 (GOWN DISPOSABLE) ×1 IMPLANT
GOWN STRL REUS W/TWL 2XL LVL3 (GOWN DISPOSABLE) ×6 IMPLANT
GOWN STRL REUS W/TWL LRG LVL3 (GOWN DISPOSABLE)
GOWN STRL REUS W/TWL XL LVL3 (GOWN DISPOSABLE) ×2
HANDLE PEDIGUARD CANNULATED (INSTRUMENTS) ×3 IMPLANT
HEMOSTAT POWDER KIT SURGIFOAM (HEMOSTASIS) IMPLANT
KIT BASIN OR (CUSTOM PROCEDURE TRAY) ×3 IMPLANT
KIT INFUSE SMALL (Orthopedic Implant) ×3 IMPLANT
KIT ROOM TURNOVER OR (KITS) ×3 IMPLANT
MILL MEDIUM DISP (BLADE) ×3 IMPLANT
NEEDLE HYPO 22GX1.5 SAFETY (NEEDLE) ×3 IMPLANT
NS IRRIG 1000ML POUR BTL (IV SOLUTION) ×3 IMPLANT
PACK LAMINECTOMY NEURO (CUSTOM PROCEDURE TRAY) ×3 IMPLANT
PAD ARMBOARD 7.5X6 YLW CONV (MISCELLANEOUS) ×9 IMPLANT
PATTIES SURGICAL .5 X1 (DISPOSABLE) ×3 IMPLANT
PIN #1 DSG SPINEGUARD (PIN) ×3 IMPLANT
PLASMABLADE 3.0S (MISCELLANEOUS) ×3
ROD TI ALLOY CVD VIT 5.5X35MM (Rod) ×4 IMPLANT
SCREW VITALITY PA 6.5X50MM (Screw) ×12 IMPLANT
SPACER ZYSTON STR 11X25X10X8 (Spacer) ×6 IMPLANT
SPONGE LAP 4X18 X RAY DECT (DISPOSABLE) IMPLANT
SPONGE SURGIFOAM ABS GEL 100 (HEMOSTASIS) ×3 IMPLANT
SUT VIC AB 1 CT1 18XBRD ANBCTR (SUTURE) ×2 IMPLANT
SUT VIC AB 1 CT1 8-18 (SUTURE) ×4
SUT VIC AB 2-0 CP2 18 (SUTURE) ×6 IMPLANT
SUT VIC AB 3-0 SH 8-18 (SUTURE) ×9 IMPLANT
SYR 3ML LL SCALE MARK (SYRINGE) ×12 IMPLANT
TOP CLOSURE TORQ LIMIT (Neuro Prosthesis/Implant) ×12 IMPLANT
TOWEL OR 17X24 6PK STRL BLUE (TOWEL DISPOSABLE) ×3 IMPLANT
TOWEL OR 17X26 10 PK STRL BLUE (TOWEL DISPOSABLE) ×3 IMPLANT
TRAP SPECIMEN MUCOUS 40CC (MISCELLANEOUS) ×3 IMPLANT
TRAY FOLEY W/METER SILVER 16FR (SET/KITS/TRAYS/PACK) ×3 IMPLANT
WATER STERILE IRR 1000ML POUR (IV SOLUTION) ×3 IMPLANT

## 2016-01-21 NOTE — Op Note (Signed)
Date of surgery: 01/21/2016 Procedure: L4-L5 decompressive laminectomy decompression of L4 and L5 nerve roots, posterior lumbar interbody arthrodesis with peek spacers local autograft and allograft, pedicle screw fixation L4-L5, posterior lateral arthrodesis L4-L5 using Zimmer instrumentation  Surgeon: Kristeen Miss M.D.  Asst.: Newman Pies M.D.  Indications: Patient is Andrea Griffin is a 82 y.o. female who who's had significant back pain and lumbar radiculopathy for over a years period time. A lumbar myelogram demonstrates advanced spondylolisthesis with high-grade canal stenosis. she was advised regarding surgical intervention.  Procedure: The patient was brought to the operating room supine on a stretcher. After the smooth induction of general endotracheal anesthesia she was turned prone and the back was prepped with alcohol and DuraPrep. The back was then draped sterilely. A midline incision was created and carried down to the lumbar dorsal fascia. A localizing radiograph identified the L4 and L5 spinous processes. A subligamentous dissection was created at L4 and L5 to expose the interlaminar space at L4 and L5 and the facet joints over the L4-L5 interspace. Laminotomies were were then created removing the entire inferior margin of the lamina of L4 including the inferior facet at the L4-L5 joint. The yellow ligament was taken up and the common dural tube was exposed along with the L4 nerve root superiorly, and the L5 nerve root inferiorly, the disc space was exposed and epidural veins in this region were cauterized and divided. The L4 nerve roots and the L5 nerve root were dissected with care taken to protect them. The disc space was opened and a combination of curettes and rongeurs was used to evacuate the disc space fully. The endplates were removed using sharp curettes. An interbody spacer was placed to distract the disc space while the contralateral discectomy was performed. When the entirety  of the disc was removed and the endplates were prepared final sizing of the disc space was obtained 11 mm peek spacers with 7 of lordosis were chosen and packed with autograft and infuse and placed into the interspace. The remainder of the interspace was packed with autograft and infuse. Pedicle entry sites were then chosen using fluoroscopic guidance and 6.5 x 50 mm screws were placed in L4 and 6.5 x 50 mm screws were placed in L5. The lateral gutters were decorticated and graft was packed in the posterolateral gutters between L4 and L5 along with strips of infuse. Final radiographs were obtained after placing appropriately sized rods between the pedicle screws at L4-L5 and torquing these to the appropriate tension. The surgical site was inspected carefully to assure the L4 and L5 nerve roots were well decompressed, hemostasis was obtained, and the graft was well packed. Then the retractors were removed and the wound was closed with #1 Vicryl in the lumbar dorsal fascia 2-0 Vicryl in the subcutaneous tissue and 3-0 Vicryl subcuticularly. When he cc of half percent Marcaine was injected into the paraspinous musculature at the time of closure. Blood loss was estimated at 250 cc. The patient tolerated procedure well and was returned to the recovery room in stable condition.

## 2016-01-21 NOTE — Anesthesia Procedure Notes (Addendum)
Procedure Name: Intubation Date/Time: 01/21/2016 11:16 AM Performed by: Kyung Rudd Pre-anesthesia Checklist: Patient identified, Emergency Drugs available, Suction available, Patient being monitored and Timeout performed Patient Re-evaluated:Patient Re-evaluated prior to inductionOxygen Delivery Method: Circle system utilized Preoxygenation: Pre-oxygenation with 100% oxygen Intubation Type: IV induction Ventilation: Mask ventilation without difficulty Laryngoscope Size: Glidescope Grade View: Grade I Tube type: Oral Tube size: 7.0 mm Number of attempts: 2 Airway Equipment and Method: Stylet Placement Confirmation: ETT inserted through vocal cords under direct vision,  positive ETCO2 and breath sounds checked- equal and bilateral Secured at: 22 cm Tube secured with: Tape Dental Injury: Injury to lip  Comments: Electively used Glidescope due to small mouth opening. Grade I view on screen.

## 2016-01-21 NOTE — Transfer of Care (Signed)
Immediate Anesthesia Transfer of Care Note  Patient: Andrea Griffin  Procedure(s) Performed: Procedure(s) with comments: Lumbar four- Lumbar five Posterior lumbar interbody fusion (N/A) - L4-5 Posterior lumbar interbody fusion  Patient Location: PACU  Anesthesia Type:General  Level of Consciousness: sedated  Airway & Oxygen Therapy: Patient Spontanous Breathing and Patient connected to face mask oxygen  Post-op Assessment: Report given to RN, Post -op Vital signs reviewed and stable and Patient moving all extremities X 4  Post vital signs: Reviewed and stable  Last Vitals:  Filed Vitals:   01/21/16 0801  BP: 150/76  Pulse: 77  Temp: 36.6 C  Resp: 20    Last Pain:  Filed Vitals:   01/21/16 1454  PainSc: 8       Patients Stated Pain Goal: 2 ([id number])  Complications: No apparent anesthesia complications

## 2016-01-21 NOTE — Anesthesia Postprocedure Evaluation (Signed)
Anesthesia Post Note  Patient: Andrea Griffin  Procedure(s) Performed: Procedure(s) (LRB): Lumbar four- Lumbar five Posterior lumbar interbody fusion (N/A)  Patient location during evaluation: PACU Anesthesia Type: General Level of consciousness: awake Pain management: pain level controlled Vital Signs Assessment: post-procedure vital signs reviewed and stable Respiratory status: spontaneous breathing Cardiovascular status: stable Anesthetic complications: no    Last Vitals:  Filed Vitals:   01/21/16 1450 01/21/16 1505  BP: 147/64 139/56  Pulse: 64 67  Temp:    Resp: 12 11    Last Pain:  Filed Vitals:   01/21/16 1506  PainSc: 8                  EDWARDS,Lenward Able

## 2016-01-21 NOTE — Anesthesia Preprocedure Evaluation (Addendum)
Anesthesia Evaluation  Patient identified by MRN, date of birth, ID band Patient awake    Reviewed: Allergy & Precautions, NPO status , Patient's Chart, lab work & pertinent test results  Airway Mallampati: III  TM Distance: >3 FB Neck ROM: Full    Dental  (+) Teeth Intact   Pulmonary asthma ,    breath sounds clear to auscultation       Cardiovascular hypertension, Pt. on medications  Rhythm:Regular Rate:Normal     Neuro/Psych    GI/Hepatic Neg liver ROS, GERD  ,  Endo/Other    Renal/GU negative Renal ROS     Musculoskeletal   Abdominal   Peds  Hematology   Anesthesia Other Findings   Reproductive/Obstetrics                            Anesthesia Physical Anesthesia Plan  ASA: III  Anesthesia Plan: General   Post-op Pain Management:    Induction: Intravenous  Airway Management Planned: Oral ETT  Additional Equipment:   Intra-op Plan:   Post-operative Plan: Extubation in OR  Informed Consent: I have reviewed the patients History and Physical, chart, labs and discussed the procedure including the risks, benefits and alternatives for the proposed anesthesia with the patient or authorized representative who has indicated his/her understanding and acceptance.   Dental advisory given  Plan Discussed with: Anesthesiologist and CRNA  Anesthesia Plan Comments:        Anesthesia Quick Evaluation

## 2016-01-21 NOTE — Progress Notes (Signed)
Utilization review completed.  

## 2016-01-21 NOTE — H&P (Signed)
Andrea Griffin is an 82 y.o. female.   Chief Complaint: Back and bilateral leg pain HPI: Patient is an 82 year old individual who has a history of back and bilateral leg pain. She was evaluated by her orthopedist Dr. Marry Guan and found to have a spondylolisthesis. Her hips were noted to be sound. She was advised that she may need surgical intervention and Dr. Hal Neer had seen the patient. She is referred to me for further evaluation and I note that she has a mobile spondylolisthesis at L4-L5 with severe stenosis. She's been advised regarding the need for surgery to decompress and stabilize L4-L5.  Past Medical History  Diagnosis Date  . GERD (gastroesophageal reflux disease)   . Asthma   . Anxiety   . Hyperlipidemia   . Hypertension   . Obesity   . Osteopenia   . Thyroid disease   . History of recurrent cystitis   . 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 Urinary frequency   . Urinary urgency   . Nocturia   . Urinary incontinence   . Arthritis   . Wears glasses   . Cancer (Andrea Griffin)     melanoma skin cancer on head    Past Surgical History  Procedure Laterality Date  . Cholecystectomy    . Abdominal hysterectomy    . Incision and drainage hip Right 09/15/2012  . Breast biopsy Right     x2  . Bladder stem dilation    . Hemiarthroplasty hip Left 2010  . Hemiarthroplasty hip Right 2013  . Incision and drainage hip Right 2013  . Conversion of hemiarthroplasty to total hip arthroplasty Left 03/27/2014  . Melanoma excision      skin cancer on top of head    History reviewed. No pertinent family history. Social History:  reports that she has never smoked. She has never used smokeless tobacco. She reports that she does not drink alcohol or use illicit drugs.  Allergies:  Allergies  Allergen Reactions  . Ciprofloxacin Hives  . Nitrofurantoin Other (See Comments)    Can not remember reaction  . Phenazopyridine Other (See Comments)    Pt doesn't remember  . Ranitidine Other (See  Comments)    Can not remember reaction  . Sulfa Antibiotics Other (See Comments)    Can not remember reaction    Medications Prior to Admission  Medication Sig Dispense Refill  . acetaminophen (TYLENOL) 500 MG tablet Take 1,000 mg by mouth 2 (two) times daily as needed for mild pain.    Marland Kitchen amLODipine (NORVASC) 10 MG tablet Take 10 mg by mouth daily.    Marland Kitchen amoxicillin (AMOXIL) 500 MG capsule Take 500 mg by mouth daily.    Marland Kitchen atorvastatin (LIPITOR) 20 MG tablet Take 20 mg by mouth daily.    . calcium-vitamin D (OSCAL WITH D) 500-200 MG-UNIT tablet Take 2 tablets by mouth daily.    . diclofenac sodium (VOLTAREN) 1 % GEL Apply 2 g topically 4 (four) times daily as needed. For pain    . fexofenadine (ALLEGRA) 180 MG tablet Take 180 mg by mouth daily.    . fluticasone (FLONASE) 50 MCG/ACT nasal spray Place 2 sprays into both nostrils daily.    . Fluticasone-Salmeterol (ADVAIR) 100-50 MCG/DOSE AEPB Inhale 1 puff into the lungs 2 (two) times daily.    Marland Kitchen levothyroxine (SYNTHROID, LEVOTHROID) 50 MCG tablet Take 50 mcg by mouth daily before breakfast.    . losartan (COZAAR) 100 MG tablet Take 100 mg by mouth daily.    Marland Kitchen  nabumetone (RELAFEN) 500 MG tablet Take 500 mg by mouth 2 (two) times daily.    Marland Kitchen omeprazole (PRILOSEC) 20 MG capsule Take 20 mg by mouth daily.      No results found for this or any previous visit (from the past 48 hour(s)). No results found.  Review of Systems  HENT: Negative.   Eyes: Negative.   Respiratory: Negative.   Cardiovascular: Negative.   Gastrointestinal: Negative.   Genitourinary: Negative.   Musculoskeletal: Positive for back pain.  Neurological: Negative.        Back and bilateral leg pain  Endo/Heme/Allergies: Negative.   Psychiatric/Behavioral: Negative.     Blood pressure 150/76, pulse 77, temperature 97.8 F (36.6 C), temperature source Oral, resp. rate 20, height 5' 2.5" (1.588 m), weight 89.812 kg (198 lb), SpO2 96 %. Physical Exam  Constitutional:  She appears well-developed and well-nourished.  HENT:  Head: Normocephalic and atraumatic.  Neck: Normal range of motion. Neck supple.  Cardiovascular: Normal rate and regular rhythm.   Respiratory: Effort normal and breath sounds normal.  GI: Soft. Bowel sounds are normal.  Musculoskeletal:  Centralized back pain to palpation and percussion. Positive straight leg raising at 15 in either lower extremity. Proximal leg weakness in the iliopsoas and the glutei  Skin: Skin is warm and dry.  Psychiatric: She has a normal mood and affect. Her behavior is normal. Judgment and thought content normal.     Assessment/Plan Spondylolisthesis and stenosis with lumbar radiculopathy L4-L5.  Plan: Decompression L4-L5 with posterior lumbar interbody arthrodesis using peek spacers local autograft and allograft.  Earleen Newport, MD 01/21/2016, 11:02 AM

## 2016-01-21 NOTE — Progress Notes (Signed)
Pt arrived to 5M12 at 1604, Pt A&Ox 4, c/o pain 2/10. Dressing CDI, no drains. Pt VS taken, pt on O2 from PACU, Fluids runningat 75 cc/hr. Foley intact, unclamped. Pt without distress. Family at the bedside. Diet ordered, will monitor.

## 2016-01-22 MED ORDER — CHLORHEXIDINE GLUCONATE CLOTH 2 % EX PADS
6.0000 | MEDICATED_PAD | Freq: Every day | CUTANEOUS | Status: DC
  Administered 2016-01-22 – 2016-01-24 (×3): 6 via TOPICAL

## 2016-01-22 MED ORDER — MUPIROCIN 2 % EX OINT
1.0000 "application " | TOPICAL_OINTMENT | Freq: Two times a day (BID) | CUTANEOUS | Status: DC
  Administered 2016-01-22 – 2016-01-24 (×5): 1 via NASAL
  Filled 2016-01-22: qty 22

## 2016-01-22 MED FILL — Heparin Sodium (Porcine) Inj 1000 Unit/ML: INTRAMUSCULAR | Qty: 30 | Status: AC

## 2016-01-22 MED FILL — Sodium Chloride IV Soln 0.9%: INTRAVENOUS | Qty: 1000 | Status: AC

## 2016-01-22 NOTE — Evaluation (Addendum)
Occupational Therapy Evaluation Patient Details Name: Andrea Griffin MRN: [id number] DOB: 1932/05/05 Today's Date: 01/22/2016    History of Present Illness 82 y.o. s/p Lumbar four- Lumbar five Posterior lumbar interbody fusion. PMH includes GERD, anxiety, HLD, HTN, obesity, osteopenia, thyroid disease, arthritis, cancer, history of recurrent cystitis.    Clinical Impression   Pt s/p above. Pt reports she was independent with ADLs, PTA. Feel pt will benefit from acute OT to increase independence prior to d/c.     Follow Up Recommendations  SNF    Equipment Recommendations  Other (comment) (defer to next venue)    Recommendations for Other Services       Precautions / Restrictions Precautions Precautions: Fall;Back Precaution Booklet Issued: No Precaution Comments: educated on back precautions Required Braces or Orthoses: Spinal Brace Spinal Brace: Lumbar corset;Applied in sitting position Restrictions Weight Bearing Restrictions: No      Mobility Bed Mobility Overal bed mobility: Needs Assistance Bed Mobility: Sidelying to Sit;Sit to Sidelying  Sidelying to sit: Supervision     Sit to sidelying: Min assist General bed mobility comments: assist with LE when going to sidelying position.   Transfers Overall transfer level: Needs assistance Equipment used: Rolling walker (2 wheeled) Transfers: Sit to/from Stand Sit to Stand: Min guard         General transfer comment: tried to get pt to stand without RW, but she didn't. Stood with RW in front of pt.    Balance Min guard for ambulation with RW.                   ADL Overall ADL's : Needs assistance/impaired     Grooming: Oral care;Standing;Min guard Grooming Details (indicate cue type and reason): OT applied toothpaste on toothbrush for pt         Upper Body Dressing : Set up;Supervision/safety;Sitting   Lower Body Dressing: Maximal assistance;Sit to/from stand   Toilet Transfer: Min  guard;Ambulation;RW (sit to stand from bed)           Functional mobility during ADLs: Min guard;Rolling walker (sit to stand and ambulation) General ADL Comments: Discussed incorporating precautions into functional activities. Discussed information about back brace wear.      Vision     Perception     Praxis      Pertinent Vitals/Pain Pain Assessment: 0-10 Pain Score: 10-Worst pain ever Pain Location: everywhere Pain Descriptors / Indicators: Moaning Pain Intervention(s): Monitored during session;Repositioned;Limited activity within patient's tolerance     Hand Dominance Right   Extremity/Trunk Assessment Upper Extremity Assessment Upper Extremity Assessment: Overall WFL for tasks assessed   Lower Extremity Assessment Lower Extremity Assessment: Defer to PT evaluation    Communication Communication Communication: No difficulties   Cognition Arousal/Alertness: Awake/alert Behavior During Therapy: WFL for tasks assessed/performed Overall Cognitive Status: Within Functional Limits for tasks assessed                     General Comments       Exercises       Shoulder Instructions      Home Living Family/patient expects to be discharged to:: Skilled nursing facility Living Arrangements: Spouse/significant other Available Help at Discharge: Family;Available 24 hours/day (spouse unable to physically assist pt per PT eval) Type of Home: House Home Access: Stairs to enter [employer] of Steps: 3 Entrance Stairs-Rails: Right Home Layout: One level     Bathroom Shower/Tub: Tub/shower unit         Home Equipment: Tub bench;Toilet  riser;Grab bars - tub/shower          Prior Functioning/Environment Level of Independence: Independent        Comments: was driving, used cane occasionally; reports spouse helped with bathing and dressing but she could have done it.    OT Diagnosis: Acute pain   OT Problem List: Pain;Decreased range of  motion;Decreased activity tolerance;Decreased knowledge of use of DME or AE;Decreased knowledge of precautions;Decreased strength   OT Treatment/Interventions: Self-care/ADL training;DME and/or AE instruction;Therapeutic activities;Patient/family education;Balance training    OT Goals(Current goals can be found in the care plan section) Acute Rehab OT Goals Patient Stated Goal: not stated OT Goal Formulation: With patient Time For Goal Achievement: 01/29/16 Potential to Achieve Goals: Good ADL Goals Pt Will Perform Grooming: with set-up;standing;with supervision (3 tasks) Pt Will Perform Lower Body Dressing: sit to/from stand;with adaptive equipment;with min assist Pt Will Transfer to Toilet: with set-up;with supervision;ambulating (3 in 1 over commode; using RW) Pt Will Perform Toileting - Clothing Manipulation and hygiene: sit to/from stand;with min guard assist Additional ADL Goal #1: Pt will independently verbalize all back precautions and maintain in session.  OT Frequency: Min 2X/week   Barriers to D/C:            Co-evaluation              End of Session Equipment Utilized During Treatment: Gait belt;Rolling walker;Back brace  Activity Tolerance: Patient limited by pain Patient left: in bed;with call bell/phone within reach;with bed alarm set;with family/visitor present   Time: [id number] OT Time Calculation (min): 17 min Charges:  OT General Charges $OT Visit: 1 Procedure OT Evaluation $OT Eval Moderate Complexity: 1 Procedure G-CodesBenito Mccreedy OTR/L [id number] 01/22/2016, 10:12 AM

## 2016-01-22 NOTE — Evaluation (Signed)
Physical Therapy Evaluation Patient Details Name: Andrea Griffin MRN: [id number] DOB: [birth year] Today's Date: 01/22/2016   History of Present Illness  Patient is an 82 year old individual who has a history of back and bilateral leg pain. Pt underwent L4-5 decompressive lami.  Clinical Impression  Patient is s/p above surgery resulting in the deficits listed below (see PT Problem List). Pt OOB mobility greatly limited by pain and anxiety. Recommend SNF for increased time to achieve safe mod I level of function. Patient will benefit from skilled PT to increase their independence and safety with mobility (while adhering to their precautions) to allow discharge to the venue listed below.     Follow Up Recommendations SNF;Supervision/Assistance - 24 hour    Equipment Recommendations  Rolling walker with 5" wheels    Recommendations for Other Services       Precautions / Restrictions Precautions Precautions: Fall;Back Precaution Booklet Issued: Yes (comment) Precaution Comments: educated spouse and pt on precautions Required Braces or Orthoses: Spinal Brace Spinal Brace: Lumbar corset Restrictions Weight Bearing Restrictions: No      Mobility  Bed Mobility Overal bed mobility: Needs Assistance Bed Mobility: Rolling;Sidelying to Sit Rolling: Supervision Sidelying to sit: Min guard       General bed mobility comments: pt able to roll L using bed rail and then pushed up, increased time,   Transfers Overall transfer level: Needs assistance Equipment used: Rolling walker (2 wheeled) Transfers: Sit to/from Stand Sit to Stand: Min assist         General transfer comment: v/c's to push up from bed, increased time, v/c's to minimize bending  Ambulation/Gait Ambulation/Gait assistance: Min assist;+2 safety/equipment Ambulation Distance (Feet): 6 Feet Assistive device: Rolling walker (2 wheeled) Gait Pattern/deviations: Step-through pattern;Decreased stride  length;Shuffle Gait velocity: slow Gait velocity interpretation: Below normal speed for age/gender General Gait Details: pt limited by pain and anxiety, max encouragement to amb to the door. minimal step height  Stairs            Wheelchair Mobility    Modified Rankin (Stroke Patients Only)       Balance Overall balance assessment: Needs assistance   Sitting balance-Leahy Scale: Good     Standing balance support: Bilateral upper extremity supported Standing balance-Leahy Scale: Poor Standing balance comment: requires RW to hold onto                             Pertinent Vitals/Pain Pain Assessment: 0-10 Pain Score: 10-Worst pain ever Pain Location: back/incisional with mobility, 5 at rest Pain Descriptors / Indicators: Stabbing Pain Intervention(s): Limited activity within patient's tolerance;Monitored during session    Home Living Family/patient expects to be discharged to:: Skilled nursing facility Living Arrangements: Spouse/significant other Available Help at Discharge: Family;Available 24 hours/day (spouse unable to physical assist pt) Type of Home: House Home Access: Stairs to enter Entrance Stairs-Rails: Right Entrance Stairs-Number of Steps: 3 Home Layout: One level Home Equipment: Tub bench;Toilet riser;Grab bars - tub/shower      Prior Function Level of Independence: Independent         Comments: was driving, used cane occasionally     Hand Dominance   Dominant Hand: Right    Extremity/Trunk Assessment   Upper Extremity Assessment: Overall WFL for tasks assessed           Lower Extremity Assessment: Generalized weakness (due to onset of back pain with WBing and MMT)      Cervical / Trunk Assessment:  (  back surgery)  Communication   Communication: No difficulties  Cognition Arousal/Alertness: Awake/alert Behavior During Therapy: WFL for tasks assessed/performed Overall Cognitive Status: Within Functional Limits for  tasks assessed                      General Comments      Exercises        Assessment/Plan    PT Assessment Patient needs continued PT services  PT Diagnosis Acute pain;Generalized weakness;Difficulty walking   PT Problem List Decreased strength;Decreased activity tolerance;Decreased balance;Decreased mobility  PT Treatment Interventions DME instruction;Gait training;Stair training;Functional mobility training;Therapeutic exercise;Balance training;Therapeutic activities   PT Goals (Current goals can be found in the Care Plan section) Acute Rehab PT Goals Patient Stated Goal: rehab then home PT Goal Formulation: With patient Time For Goal Achievement: 01/29/16 Potential to Achieve Goals: Good    Frequency Min 5X/week   Barriers to discharge Decreased caregiver support spouse home 24/7 but pt needs to be at supervision level    Co-evaluation               End of Session Equipment Utilized During Treatment: Gait belt;Back brace Activity Tolerance: Patient limited by pain Patient left: in chair;with call bell/phone within reach;with family/visitor present Nurse Communication: Mobility status (increased pain)         Time: [id number] PT Time Calculation (min) (ACUTE ONLY): 26 min   Charges:   PT Evaluation $PT Eval Moderate Complexity: 1 Procedure PT Treatments $Gait Training: 8-22 mins   PT G CodesKingsley Callander 01/22/2016, 9:43 AM   Kittie Plater, PT, DPT Pager #: [phone] Office #: [phone]

## 2016-01-22 NOTE — Progress Notes (Signed)
Patient ID: Andrea Griffin, female   DOB: 1932/08/05, 82 y.o.   MRN: [id number] Vital signs stable  incition clean and dry Pain is managable \ Mobilizing slowly

## 2016-01-22 NOTE — Care Management Note (Signed)
Case Management Note  Patient Details  Name: Andrea Griffin MRN: [id number] Date of Birth: [birth year]  Subjective/Objective:     Pt admitted with L4-5 PLIF. She is from home with spouse.                Action/Plan: Awaiting PT/OT recs. CM following for discharge disposition.   Expected Discharge Date:                  Expected Discharge Plan:     In-House Referral:     Discharge planning Services     Post Acute Care Choice:    Choice offered to:     DME Arranged:    DME Agency:     HH Arranged:    HH Agency:     Status of Service:  In process, will continue to follow  Medicare Important Message Given:    Date Medicare IM Given:    Medicare IM give by:    Date Additional Medicare IM Given:    Additional Medicare Important Message give by:     If discussed at [place] of Stay Meetings, dates discussed:    Additional Comments:  Pollie Friar, RN 01/22/2016, 2:07 PM

## 2016-01-22 NOTE — Progress Notes (Signed)
D/C foley. Ambulated in room with brace on. Painful when walked. Medicated with pain med prior. Walked very brief. Back to bed.

## 2016-01-23 NOTE — Clinical Social Work Note (Signed)
Clinical Social Work Assessment  Patient Details  Name: Andrea Griffin MRN: [id number] Date of Birth: 06/29/1932  Date of referral:  01/23/16               Reason for consult:  Facility Placement                Permission sought to share information with:  Facility Sport and exercise psychologist, Family Supports Permission granted to share information::  Yes, Verbal Permission Granted  Name::     Sumer, Brillhart [phone] or [phone] or Judithe Modest  Agency::  SNF admissions  Relationship::     Contact Information:     Housing/Transportation Living arrangements for the past 2 months:  [place] of Information:  Patient, Other (Comment Required) Patient Interpreter Needed:  None Criminal Activity/Legal Involvement Pertinent to Current Situation/Hospitalization:  No - Comment as needed Significant Relationships:  Spouse, Other(Comment) (Granddaughter) Lives with:  Spouse Do you feel safe going back to the place where you live?  No (Patient needs some short term rehab before she is able to return back home.) Need for family participation in patient care:  Yes (Comment) (Patient requests her family to help with decision making for SNF.)  Care giving concerns:  Patient and family feels she needs short term rehab before she returns back home.   Social Worker assessment / plan:  Patient is an 82 year old female who is married and lives with her husband.  Patient has been to rehab before and is familiar with the process.  Patient's grandaughter was at bedside and stated patient has been to [facility] and would like to go to SNF if possible.  Patient is alert and oriented x4 and able to express her feelings.  Patient did not express any other questions or concerns.  CSW reminded patient and family of how insurance pays for the stay and what the role of CSW is.  Employment status:  Retired Forensic scientist:  Medicare PT Recommendations:   [place] / Referral to community resources:  [place]  Patient/Family's Response to care:  Patient and family in agreement to going to SNF for short term rehab.  Patient/Family's Understanding of and Emotional Response to Diagnosis, Current Treatment, and Prognosis:  Patient and family aware of current diagnosis and treatment plan.  Emotional Assessment Appearance:  Appears stated age Attitude/Demeanor/Rapport:    Affect (typically observed):  Appropriate, Stable Orientation:  Oriented to Self, Oriented to Place, Oriented to  Time, Oriented to Situation Alcohol / Substance use:  Not Applicable Psych involvement (Current and /or in the community):  No (Comment)  Discharge Needs  Concerns to be addressed:  Lack of Support Readmission within the last 30 days:  No Current discharge risk:  None Barriers to Discharge:  No Barriers Identified   Anell Barr 01/23/2016, 4:49 PM

## 2016-01-23 NOTE — Clinical Social Work Note (Signed)
CSW spoke to patient and her family they are in agreement to having patient faxed out to [facility].  Patient and family's preference is [facility], awaiting bed offers.  Jones Broom. [place], MSW, [place] 01/23/2016 4:58 PM

## 2016-01-23 NOTE — Progress Notes (Signed)
PT Cancellation Note  Patient Details Name: Andrea Griffin MRN: [id number] DOB: 06/26/1932   Cancelled Treatment:    Reason Eval/Treat Not Completed: Patient declined, no reason specified Pt declined any mobility despite premedication and encouragement. Pt reported that she just got back to bed and kept eyes closed. Granddaughter present.    [place] April, PTA Pager: [phone]   01/23/2016, 3:49 PM

## 2016-01-23 NOTE — Progress Notes (Signed)
Patient ID: Andrea Griffin, female   DOB: 1931/10/10, 82 y.o.   MRN: [id number] Tmax 99.8 vital signs otherwise stable Motor function intact  encourage good pulmonary toilet with deep breathing and ambulation Dressing removed and incision is clean and dry.  patient may shower

## 2016-01-23 NOTE — Clinical Social Work Placement (Signed)
   CLINICAL SOCIAL WORK PLACEMENT  NOTE  Date:  01/23/2016  Patient Details  Name: Andrea Griffin MRN: [id number] Date of Birth: 1932-08-07  Clinical Social Work is seeking post-discharge placement for this patient at the [place] level of care (*CSW will initial, date and re-position this form in  chart as items are completed):  Yes   Patient/family provided with [place] Work Department's list of facilities offering this level of care within the geographic area requested by the patient (or if unable, by the patient's family).  Yes   Patient/family informed of their freedom to choose among providers that offer the needed level of care, that participate in Medicare, Medicaid or managed care program needed by the patient, have an available bed and are willing to accept the patient.  Yes   Patient/family informed of [place]'s ownership interest in [facility] and [facility], as well as of the fact that they are under no obligation to receive care at these facilities.  PASRR submitted to EDS on 01/23/16     PASRR number received on       Existing PASRR number confirmed on 01/23/16     FL2 transmitted to all facilities in geographic area requested by pt/family on 01/23/16     FL2 transmitted to all facilities within larger geographic area on       Patient informed that his/her managed care company has contracts with or will negotiate with certain facilities, including the following:            Patient/family informed of bed offers received.  Patient chooses bed at       Physician recommends and patient chooses bed at      Patient to be transferred to   on  .  Patient to be transferred to facility by       Patient family notified on   of transfer.  Name of family member notified:        PHYSICIAN Please sign FL2     Additional Comment:    _______________________________________________ Ross Ludwig, LCSWA 01/23/2016,  4:57 PM

## 2016-01-23 NOTE — Care Management Important Message (Signed)
Important Message  Patient Details  Name: Andrea Griffin MRN: [id number] Date of Birth: 16-Apr-1932   Medicare Important Message Given:  Yes    Loann Quill 01/23/2016, 12:20 PM

## 2016-01-23 NOTE — Clinical Social Work Placement (Deleted)
   CLINICAL SOCIAL WORK PLACEMENT  NOTE  Date:  01/23/2016  Patient Details  Name: Andrea Griffin MRN: [id number] Date of Birth: 1932-02-07  Clinical Social Work is seeking post-discharge placement for this patient at the [place] level of care (*CSW will initial, date and re-position this form in  chart as items are completed):  Yes   Patient/family provided with [place] Work Department's list of facilities offering this level of care within the geographic area requested by the patient (or if unable, by the patient's family).  Yes   Patient/family informed of their freedom to choose among providers that offer the needed level of care, that participate in Medicare, Medicaid or managed care program needed by the patient, have an available bed and are willing to accept the patient.  Yes   Patient/family informed of [place]'s ownership interest in [facility] and [facility], as well as of the fact that they are under no obligation to receive care at these facilities.  PASRR submitted to EDS on 01/23/16     PASRR number received on       Existing PASRR number confirmed on 01/23/16     FL2 transmitted to all facilities in geographic area requested by pt/family on 01/23/16     FL2 transmitted to all facilities within larger geographic area on       Patient informed that his/her managed care company has contracts with or will negotiate with certain facilities, including the following:            Patient/family informed of bed offers received.  Patient chooses bed at       Physician recommends and patient chooses bed at      Patient to be transferred to   on  .  Patient to be transferred to facility by       Patient family notified on   of transfer.  Name of family member notified:        PHYSICIAN Please sign FL2     Additional Comment:    _______________________________________________ Ross Ludwig, LCSWA 01/23/2016,  4:56 PM

## 2016-01-23 NOTE — NC FL2 (Signed)
[place] LEVEL OF CARE SCREENING TOOL     IDENTIFICATION  Patient Name: Andrea Griffin Birthdate: May 21, 1932 Sex: female Admission Date (Current Location): 01/21/2016  [facility] and [place] Number:  Engineering geologist and Address:  [facility] [name], [name] [place], [place] [address]      Provider Number: [id number]  Attending Physician Name and Address:  Kristeen Miss, MD  Relative Name and Phone Number:  Jakhiya, Maharaj [phone] or [phone] or Cheri Rous [phone]    Current Level of Care: Hospital Recommended Level of Care: [place] Prior Approval Number:    Date Approved/Denied:   PASRR Number: [id number]  Discharge Plan: SNF    Current Diagnoses: Patient Active Problem List   Diagnosis Date Noted  . Spondylolisthesis of lumbar region 01/21/2016    Orientation RESPIRATION BLADDER Height & Weight     Self, Time, Situation, Place  O2 (1 L per minute) Continent Weight: 198 lb (89.812 kg) Height:  5' 2.5" (158.8 cm)  BEHAVIORAL SYMPTOMS/MOOD NEUROLOGICAL BOWEL NUTRITION STATUS      Continent Diet (Regular)  AMBULATORY STATUS COMMUNICATION OF NEEDS Skin   Limited Assist Verbally Surgical wounds                       Personal Care Assistance Level of Assistance  Dressing Bathing Assistance: Limited assistance   Dressing Assistance: Limited assistance     Functional Limitations Info             SPECIAL CARE FACTORS FREQUENCY  PT (By licensed PT), OT (By licensed OT)     PT Frequency: 5x a week OT Frequency: 5x a week            Contractures      Additional Factors Info  Allergies   Allergies Info: CIPROFLOXACIN, NITROFURANTOIN, PHENAZOPYRIDINE, RANITIDINE, SULFA ANTIBIOTICS           Current Medications (01/23/2016):  This is the current hospital active medication list Current Facility-Administered Medications  Medication Dose Route Frequency  Provider Last Rate Last Dose  . 0.9 %  sodium chloride infusion  250 mL Intravenous Continuous Kristeen Miss, MD      . 0.9 %  sodium chloride infusion   Intravenous Continuous Kristeen Miss, MD 75 mL/hr at 01/21/16 2030    . acetaminophen (TYLENOL) tablet 650 mg  650 mg Oral Q4H PRN Kristeen Miss, MD       Or  . acetaminophen (TYLENOL) suppository 650 mg  650 mg Rectal Q4H PRN Kristeen Miss, MD      . alum & mag hydroxide-simeth (MAALOX/MYLANTA) 200-200-20 MG/5ML suspension 30 mL  30 mL Oral Q6H PRN Kristeen Miss, MD      . amLODipine (NORVASC) tablet 10 mg  10 mg Oral Daily Kristeen Miss, MD   10 mg at 01/23/16 1125  . atorvastatin (LIPITOR) tablet 20 mg  20 mg Oral Daily Kristeen Miss, MD   20 mg at 01/23/16 1124  . bisacodyl (DULCOLAX) suppository 10 mg  10 mg Rectal Daily PRN Kristeen Miss, MD      . Chlorhexidine Gluconate Cloth 2 % PADS 6 each  6 each Topical Q0600 Kristeen Miss, MD   6 each at 01/23/16 [phone]  . docusate sodium (COLACE) capsule 100 mg  100 mg Oral BID Kristeen Miss, MD   100 mg at 01/23/16 1122  . fluticasone (FLONASE) 50 MCG/ACT nasal spray 2 spray  2 spray Each Nare Daily Kristeen Miss,  MD   2 spray at 01/22/16 1000  . HYDROcodone-acetaminophen (NORCO/VICODIN) 5-325 MG per tablet 1-2 tablet  1-2 tablet Oral Q4H PRN Kristeen Miss, MD   2 tablet at 01/23/16 1627  . levothyroxine (SYNTHROID, LEVOTHROID) tablet 50 mcg  50 mcg Oral QAC breakfast Kristeen Miss, MD   50 mcg at 01/23/16 1122  . loratadine (CLARITIN) tablet 10 mg  10 mg Oral Daily Kristeen Miss, MD   10 mg at 01/23/16 1124  . losartan (COZAAR) tablet 100 mg  100 mg Oral Daily Kristeen Miss, MD   100 mg at 01/23/16 1125  . magnesium citrate solution 1 Bottle  1 Bottle Oral Once PRN Kristeen Miss, MD      . menthol-cetylpyridinium (CEPACOL) lozenge 3 mg  1 lozenge Oral PRN Kristeen Miss, MD       Or  . phenol (CHLORASEPTIC) mouth spray 1 spray  1 spray Mouth/Throat PRN Kristeen Miss, MD      . methocarbamol (ROBAXIN) tablet 500 mg   500 mg Oral Q6H PRN Kristeen Miss, MD   500 mg at 01/23/16 1124   Or  . methocarbamol (ROBAXIN) 500 mg in dextrose 5 % 50 mL IVPB  500 mg Intravenous Q6H PRN Kristeen Miss, MD      . mometasone-formoterol [facility]) 100-5 MCG/ACT inhaler 2 puff  2 puff Inhalation BID Kristeen Miss, MD   2 puff at 01/23/16 0934  . morphine 2 MG/ML injection 1-4 mg  1-4 mg Intravenous Q3H PRN Kristeen Miss, MD   2 mg at 01/23/16 0235  . mupirocin ointment (BACTROBAN) 2 % 1 application  1 application Nasal BID Kristeen Miss, MD   1 application at [phone]  . ondansetron (ZOFRAN) injection 4 mg  4 mg Intravenous Q4H PRN Kristeen Miss, MD   4 mg at 01/22/16 1013  . oxyCODONE-acetaminophen (PERCOCET/ROXICET) 5-325 MG per tablet 1-2 tablet  1-2 tablet Oral Q4H PRN Kristeen Miss, MD   2 tablet at 01/23/16 0540  . pantoprazole (PROTONIX) EC tablet 40 mg  40 mg Oral Daily Kristeen Miss, MD   40 mg at 01/23/16 1125  . polyethylene glycol (MIRALAX / GLYCOLAX) packet 17 g  17 g Oral Daily PRN Kristeen Miss, MD      . senna (SENOKOT) tablet 8.6 mg  1 tablet Oral BID Kristeen Miss, MD   8.6 mg at 01/23/16 1125  . sodium chloride flush (NS) 0.9 % injection 3 mL  3 mL Intravenous Q12H Kristeen Miss, MD   3 mL at 01/23/16 1000  . sodium chloride flush (NS) 0.9 % injection 3 mL  3 mL Intravenous PRN Kristeen Miss, MD         Discharge Medications: Please see discharge summary for a list of discharge medications.  Relevant Imaging Results:  Relevant Lab Results:   Additional Information SSN [id number]  Ross Ludwig, [place]

## 2016-01-24 MED ORDER — METHOCARBAMOL 500 MG PO TABS: 500.0000 mg | ORAL_TABLET | Freq: Four times a day (QID) | ORAL | Status: DC | PRN

## 2016-01-24 MED ORDER — DEXAMETHASONE 2 MG PO TABS
1.0000 mg | ORAL_TABLET | Freq: Four times a day (QID) | ORAL | Status: DC
  Administered 2016-01-24: 1 mg via ORAL
  Filled 2016-01-24: qty 1

## 2016-01-24 MED ORDER — OXYCODONE-ACETAMINOPHEN 5-325 MG PO TABS: 1.0000 | ORAL_TABLET | ORAL | Status: DC | PRN

## 2016-01-24 MED ORDER — DEXAMETHASONE 1 MG PO TABS: ORAL_TABLET | ORAL | Status: DC

## 2016-01-24 NOTE — Clinical Social Work Note (Addendum)
CSW met with patient and her family to discuss bed offers.  Patient's family chose [facility].  CSW contacted [facility] who can take patient today.  Patient to be d/c'ed today to [facility].  Patient and family agreeable to plans will transport via ems RN to call report  [phone] [phone].  Eric Anterhaus, MSW, LCSWA [phone]  

## 2016-01-24 NOTE — Discharge Summary (Signed)
Physician Discharge Summary  Patient ID: Andrea Griffin MRN: [id number] DOB/AGE: [birth year] 82 y.o.  Admit date: 01/21/2016 Discharge date: 01/24/2016  Admission Diagnoses:Spondylolisthesis and stenosis L4-L5 with lumbar radiculopathy, neurogenic claudication  Discharge Diagnoses: Spondylolisthesis and stenosis L4-L5 with lumbar radiculopathy, neurogenic claudication  Active Problems:   Spondylolisthesis of lumbar region   Discharged Condition: fair  Hospital Course: Patient was admitted to undergo surgical decompression and stabilization at L4-L5. She tolerated surgery well. Spin mobilizing slowly. She does have some easy shortness of breath. She will benefit from placement in a skilled nursing facility  Consults: None   Significant Diagnostic Studies: None  Treatments: surgery: Decompression and fusion L4-L5 with posterior lateral arthrodesis and posterior interbody arthrodesis L4-L5.  Discharge Exam: Blood pressure 145/56, pulse 94, temperature 99.5 F (37.5 C), temperature source Oral, resp. rate 16, height 5' 2.5" (1.588 m), weight 89.812 kg (198 lb), SpO2 97 %. Incision is clean and dry motor function is intact station and gait are intact.  Disposition:  skilled nursing facility when bed available  Discharge Instructions    Call MD for:  redness, tenderness, or signs of infection (pain, swelling, redness, odor or green/yellow discharge around incision site)    Complete by:  As directed      Call MD for:  severe uncontrolled pain    Complete by:  As directed      Call MD for:  temperature >100.4    Complete by:  As directed      Diet - low sodium heart healthy    Complete by:  As directed      Increase activity slowly    Complete by:  As directed             Medication List    TAKE these medications        acetaminophen 500 MG tablet  Commonly known as:  TYLENOL  Take 1,000 mg by mouth 2 (two) times daily as needed for mild pain.     amLODipine 10 MG tablet   Commonly known as:  NORVASC  Take 10 mg by mouth daily.     amoxicillin 500 MG capsule  Commonly known as:  AMOXIL  Take 500 mg by mouth daily.     atorvastatin 20 MG tablet  Commonly known as:  LIPITOR  Take 20 mg by mouth daily.     calcium-vitamin D 500-200 MG-UNIT tablet  Commonly known as:  OSCAL WITH D  Take 2 tablets by mouth daily.     dexamethasone 1 MG tablet  Commonly known as:  DECADRON  2 tablets twice daily for 2 days, one tablet twice daily for 2 days, one tablet daily for 2 days.     diclofenac sodium 1 % Gel  Commonly known as:  VOLTAREN  Apply 2 g topically 4 (four) times daily as needed. For pain     fexofenadine 180 MG tablet  Commonly known as:  ALLEGRA  Take 180 mg by mouth daily.     fluticasone 50 MCG/ACT nasal spray  Commonly known as:  FLONASE  Place 2 sprays into both nostrils daily.     Fluticasone-Salmeterol 100-50 MCG/DOSE Aepb  Commonly known as:  ADVAIR  Inhale 1 puff into the lungs 2 (two) times daily.     levothyroxine 50 MCG tablet  Commonly known as:  SYNTHROID, LEVOTHROID  Take 50 mcg by mouth daily before breakfast.     losartan 100 MG tablet  Commonly known as:  COZAAR  Take 100 mg  by mouth daily.     methocarbamol 500 MG tablet  Commonly known as:  ROBAXIN  Take 1 tablet (500 mg total) by mouth every 6 (six) hours as needed for muscle spasms.     nabumetone 500 MG tablet  Commonly known as:  RELAFEN  Take 500 mg by mouth 2 (two) times daily.     omeprazole 20 MG capsule  Commonly known as:  PRILOSEC  Take 20 mg by mouth daily.     oxyCODONE-acetaminophen 5-325 MG tablet  Commonly known as:  PERCOCET/ROXICET  Take 1-2 tablets by mouth every 4 (four) hours as needed for moderate pain.         Signed: Earleen Newport 01/24/2016, 1:10 PM

## 2016-01-24 NOTE — Progress Notes (Signed)
Subjective: Patient reports Some increase in back pain with leg pain also moving slowly  Objective: Vital signs in last 24 hours: Temp:  [98.1 F (36.7 C)-99.5 F (37.5 C)] 99.5 F (37.5 C) (05/26 0706) Pulse Rate:  [80-96] 94 (05/26 0856) Resp:  [16-18] 16 (05/26 0856) BP: (128-166)/(37-56) 145/56 mmHg (05/26 0706) SpO2:  [95 %-98 %] 97 % (05/26 0856)  Intake/Output from previous day:   Intake/Output this shift:    Incision is clean and dry motor function appears intact in lower extremities.  Lab Results: No results for input(s): WBC, HGB, HCT, PLT in the last 72 hours. BMET No results for input(s): NA, K, CL, CO2, GLUCOSE, BUN, CREATININE, CALCIUM in the last 72 hours.  Studies/Results: No results found.  Assessment/Plan: Recovering slowly. Discharge planning includes skilled nursing facility.  LOS: 3 days  [place] 2 form has been signed. Patient will be readied for discharge to skilled nursing facility with discharge summary dictated today as regards her back pain L start her on a tapering dose of Decadron. Prescription for that is also written in addition to pain medication prescriptions.   Gwyneth Fernandez J 01/24/2016, 1:02 PM

## 2016-01-24 NOTE — Care Management Note (Signed)
Case Management Note  Patient Details  Name: Andrea Griffin MRN: [id number] Date of Birth: 05-Feb-1932  Subjective/Objective:                    Action/Plan: Plan is for patient to d/c to [facility] today. No further needs per CM.   Expected Discharge Date:  01/25/16               Expected Discharge Plan:  Skilled Nursing Facility  In-House Referral:  Clinical Social Work  Discharge planning Services  CM Consult  Post Acute Care Choice:    Choice offered to:     DME Arranged:    DME Agency:     HH Arranged:    [place] Agency:     Status of Service:  Completed, signed off  Medicare Important Message Given:  Yes Date Medicare IM Given:    Medicare IM give by:    Date Additional Medicare IM Given:    Additional Medicare Important Message give by:     If discussed at [place] of Stay Meetings, dates discussed:    Additional Comments:  Pollie Friar, RN 01/24/2016, 3:21 PM

## 2016-01-24 NOTE — Progress Notes (Signed)
Physical Therapy Treatment Patient Details Name: Andrea Griffin MRN: [id number] DOB: 1932/04/19 Today's Date: 01/24/2016    History of Present Illness 82 y.o. s/p Lumbar four- Lumbar five Posterior lumbar interbody fusion. PMH includes GERD, anxiety, HLD, HTN, obesity, osteopenia, thyroid disease, arthritis, cancer, history of recurrent cystitis.     PT Comments    Patient very anxious during session today with difficulty staying focused on task. Requires constant cues to perform transfers. HR ranging from 59-171 bpm throughout session. Utilization of breathing and relaxation techniques to decrease HR as pt holding breath and fidgeting roughly rubbing nose and eyes. Tolerated SPT to [facility] and chair with max encouragement. RN notified. Will continue to follow.   Follow Up Recommendations  SNF;Supervision/Assistance - 24 hour     Equipment Recommendations  Rolling walker with 5" wheels    Recommendations for Other Services       Precautions / Restrictions Precautions Precautions: Fall;Back Precaution Booklet Issued: No Precaution Comments: Reviewed back precautions Required Braces or Orthoses: Spinal Brace Spinal Brace: Lumbar corset;Applied in sitting position Restrictions Weight Bearing Restrictions: No    Mobility  Bed Mobility Overal bed mobility: Needs Assistance Bed Mobility: Rolling;Sidelying to Sit Rolling: Min assist Sidelying to sit: Mod assist;HOB elevated       General bed mobility comments: Constant cues required to perform tasks- Min A for rolling and Mod A to elevate trunk to sitting position.   Transfers Overall transfer level: Needs assistance Equipment used: Rolling walker (2 wheeled) Transfers: Sit to/from [facility] Sit to Stand: Min assist Stand pivot transfers: Min assist       General transfer comment: Min A to boost from EOB x2 with increased time and cues. Pt very anxious and fidgeting throughout session, distracted and hard  to stay focused on session. HOlding breath. SPT bed to [facility] and [facility] to chair with Min A. HR up to 170 bpm sitting EOB. HR ranging from 59-170 bpm. RN notified .  Ambulation/Gait Ambulation/Gait assistance:  (Declined ambulation. )               Stairs            Wheelchair Mobility    Modified Rankin (Stroke Patients Only)       Balance Overall balance assessment: Needs assistance Sitting-balance support: Feet supported;Bilateral upper extremity supported Sitting balance-Leahy Scale: Fair     Standing balance support: During functional activity;Bilateral upper extremity supported Standing balance-Leahy Scale: Poor                      Cognition Arousal/Alertness: Awake/alert Behavior During Therapy: Anxious Overall Cognitive Status: Within Functional Limits for tasks assessed                      Exercises      General Comments General comments (skin integrity, edema, etc.): Family present. Pt very anxious and eyes closed most o session. Holding breath and not able to focus on session. Constantly rubbing nose - bruise forming. Per granddaughter- this happens at home sometimes and pt on anxiety medications.       Pertinent Vitals/Pain Pain Assessment: Faces Faces Pain Scale: Hurts even more Pain Location: back Pain Descriptors / Indicators: Grimacing Pain Intervention(s): Monitored during session;Repositioned;RN gave pain meds during session;Limited activity within patient's tolerance    Home Living                      Prior Function  PT Goals (current goals can now be found in the care plan section) Progress towards PT goals: Not progressing toward goals - comment (secondary to anxiety. )    Frequency  Min 5X/week    PT Plan Current plan remains appropriate    Co-evaluation             End of Session Equipment Utilized During Treatment: Gait belt;Back brace Activity Tolerance: Patient limited by  pain;Other (comment) (anxiety) Patient left: in chair;with call bell/phone within reach;with family/visitor present     Time: [id number] PT Time Calculation (min) (ACUTE ONLY): 41 min  Charges:  $Therapeutic Activity: 38-52 mins                    G Codes:      Andrea Griffin A Aleeza [place] 01/24/2016, 4:25 PM Wray Kearns, [place], DPT [phone]

## 2016-01-30 ENCOUNTER — Encounter (HOSPITAL_COMMUNITY): Payer: Self-pay | Admitting: Neurological Surgery

## 2019-10-02 ENCOUNTER — Other Ambulatory Visit: Payer: Self-pay | Admitting: Orthopedic Surgery

## 2019-10-02 ENCOUNTER — Encounter
Admission: RE | Admit: 2019-10-02 | Discharge: 2019-10-02 | Disposition: A | Discharge status: Home / Self Care | Payer: Medicare Other | Source: Ambulatory Visit | Attending: Orthopedic Surgery | Admitting: Orthopedic Surgery

## 2019-10-02 NOTE — Patient Instructions (Signed)
Your procedure is scheduled on: Thursday, Feb. 4 Report to Day Surgery on the 2nd floor of the [facility]. To find out your arrival time, please call [phone] between 1PM - 3PM on: Wednesday, Feb. 3  REMEMBER: Instructions that are not followed completely may result in serious medical risk, up to and including death; or upon the discretion of your surgeon and anesthesiologist your surgery may need to be rescheduled.  Do not eat food after midnight the night before surgery.  No gum chewing, lozengers or hard candies.  You may however, drink CLEAR liquids up to 2 hours before you are scheduled to arrive for your surgery. Do not drink anything within 2 hours of the start of your surgery.  Clear liquids include: - water  - apple juice without pulp - gatorade - black coffee or tea (Do NOT add milk or creamers to the coffee or tea) Do NOT drink anything that is not on this list.  ENSURE PRE-SURGERY CARBOHYDRATE DRINK:  Complete drinking 3 hours prior to surgery.  No Alcohol for 24 hours before or after surgery.  No Smoking including e-cigarettes for 24 hours prior to surgery.  No chewable tobacco products for at least 6 hours prior to surgery.  No nicotine patches on the day of surgery.  On the morning of surgery brush your teeth with toothpaste and water, you may rinse your mouth with mouthwash if you wish. Do not swallow any toothpaste or mouthwash.  Notify your doctor if there is any change in your medical condition (cold, fever, infection).  Do not wear jewelry, make-up, hairpins, clips or nail polish.  Do not wear lotions, powders, or perfumes.   Do not shave 48 hours prior to surgery.   Contacts and dentures may not be worn into surgery.  Do not bring valuables to the hospital, including drivers license, insurance or credit cards.  [facility] is not responsible for any belongings or valuables.   TAKE THESE MEDICATIONS THE MORNING OF SURGERY:  1.  Tylenol 2.   Amlodipine 3.  advair inhaler 4.  Omeprazole - (take one the night before and one on the morning of surgery - helps to prevent nausea after surgery.)  Use CHG Soap as directed on instruction sheet.  Use inhalers on the day of surgery and bring to the hospital.  Stop Anti-inflammatories (NSAIDS) such as Advil, Aleve, Ibuprofen, Motrin, Naproxen, Naprosyn and Aspirin based products such as Excedrin, Goodys Powder, BC Powder. (May take Tylenol or Acetaminophen if needed.)  Stop ANY OVER THE COUNTER supplements until after surgery.  Wear comfortable clothing (specific to your surgery type) to the hospital.  If you are being discharged the day of surgery, you will not be allowed to drive home. You will need a responsible adult to drive you home and stay with you that night.   If you are taking public transportation, you will need to have a responsible adult with you. Please confirm with your physician that it is acceptable to use public transportation.   Please call [phone] if you have any questions about these instructions.

## 2019-10-03 ENCOUNTER — Other Ambulatory Visit: Admission: RE | Admit: 2019-10-03 | Payer: BC Managed Care – PPO | Source: Ambulatory Visit

## 2019-10-03 ENCOUNTER — Encounter
Admission: RE | Admit: 2019-10-03 | Discharge: 2019-10-03 | Disposition: A | Discharge status: Home / Self Care | Payer: Medicare PPO | Source: Ambulatory Visit | Attending: Orthopedic Surgery | Admitting: Orthopedic Surgery

## 2019-10-03 ENCOUNTER — Other Ambulatory Visit: Payer: Self-pay

## 2019-10-03 DIAGNOSIS — Z20822 Contact with and (suspected) exposure to covid-19: Secondary | ICD-10-CM | POA: Diagnosis not present

## 2019-10-03 DIAGNOSIS — I10 Essential (primary) hypertension: Secondary | ICD-10-CM | POA: Insufficient documentation

## 2019-10-03 DIAGNOSIS — J984 Other disorders of lung: Secondary | ICD-10-CM | POA: Insufficient documentation

## 2019-10-03 DIAGNOSIS — Z01812 Encounter for preprocedural laboratory examination: Secondary | ICD-10-CM | POA: Diagnosis present

## 2019-10-03 DIAGNOSIS — Z01810 Encounter for preprocedural cardiovascular examination: Secondary | ICD-10-CM | POA: Insufficient documentation

## 2019-10-03 DIAGNOSIS — R9431 Abnormal electrocardiogram [ECG] [EKG]: Secondary | ICD-10-CM | POA: Insufficient documentation

## 2019-10-03 LAB — CBC
HCT: 37.7 % (ref 36.0–46.0)
Hemoglobin: 12.6 g/dL (ref 12.0–15.0)
MCH: 32.4 pg (ref 26.0–34.0)
MCHC: 33.4 g/dL (ref 30.0–36.0)
MCV: 96.9 fL (ref 80.0–100.0)
Platelets: 353 10*3/uL (ref 150–400)
RBC: 3.89 MIL/uL (ref 3.87–5.11)
RDW: 12.1 % (ref 11.5–15.5)
WBC: 8 10*3/uL (ref 4.0–10.5)
nRBC: 0 % (ref 0.0–0.2)

## 2019-10-03 LAB — SARS CORONAVIRUS 2 (TAT 6-24 HRS): SARS Coronavirus 2: NEGATIVE

## 2019-10-03 LAB — BASIC METABOLIC PANEL
Anion gap: 12 (ref 5–15)
BUN: 14 mg/dL (ref 8–23)
CO2: 25 mmol/L (ref 22–32)
Calcium: 8.9 mg/dL (ref 8.9–10.3)
Chloride: 92 mmol/L — ABNORMAL LOW (ref 98–111)
Creatinine, Ser: 0.56 mg/dL (ref 0.44–1.00)
GFR calc Af Amer: >60 mL/min (ref >60)
GFR calc non Af Amer: >60 mL/min (ref >60)
Glucose, Bld: 101 mg/dL — ABNORMAL HIGH (ref 70–99)
Potassium: 3.1 mmol/L — ABNORMAL LOW (ref 3.5–5.1)
Sodium: 129 mmol/L — ABNORMAL LOW (ref 135–145)

## 2019-10-03 NOTE — Pre-Procedure Instructions (Signed)
Pre-Admit Testing Provider Notification Note  Provider Notified: Dr. Rudene Christians  Notification Mode: Fax  Reason: Abnormal Labs  Response: Fax confirmation received.  Additional Information: Placed on chart. Fax confirmation received.  Signed: Beulah Gandy, RN

## 2019-10-05 ENCOUNTER — Ambulatory Visit: Payer: Medicare PPO | Admitting: Anesthesiology

## 2019-10-05 ENCOUNTER — Encounter: Payer: Self-pay | Admitting: Orthopedic Surgery

## 2019-10-05 ENCOUNTER — Other Ambulatory Visit: Payer: Self-pay

## 2019-10-05 ENCOUNTER — Ambulatory Visit
Admission: RE | Admit: 2019-10-05 | Discharge: 2019-10-05 | Disposition: A | Discharge status: Home / Self Care | Payer: Medicare PPO | Source: Ambulatory Visit | Attending: Orthopedic Surgery | Admitting: Orthopedic Surgery

## 2019-10-05 ENCOUNTER — Encounter
Admission: RE | Disposition: A | Discharge status: Home / Self Care | Payer: Self-pay | Source: Ambulatory Visit | Attending: Orthopedic Surgery

## 2019-10-05 ENCOUNTER — Ambulatory Visit: Payer: Medicare PPO

## 2019-10-05 DIAGNOSIS — K219 Gastro-esophageal reflux disease without esophagitis: Secondary | ICD-10-CM | POA: Diagnosis not present

## 2019-10-05 DIAGNOSIS — Z9889 Other specified postprocedural states: Secondary | ICD-10-CM

## 2019-10-05 DIAGNOSIS — F419 Anxiety disorder, unspecified: Secondary | ICD-10-CM | POA: Diagnosis not present

## 2019-10-05 DIAGNOSIS — W1809XA Striking against other object with subsequent fall, initial encounter: Secondary | ICD-10-CM | POA: Diagnosis not present

## 2019-10-05 DIAGNOSIS — S52571A Other intraarticular fracture of lower end of right radius, initial encounter for closed fracture: Secondary | ICD-10-CM | POA: Insufficient documentation

## 2019-10-05 DIAGNOSIS — Z882 Allergy status to sulfonamides status: Secondary | ICD-10-CM | POA: Insufficient documentation

## 2019-10-05 DIAGNOSIS — Z79899 Other long term (current) drug therapy: Secondary | ICD-10-CM | POA: Insufficient documentation

## 2019-10-05 DIAGNOSIS — Z881 Allergy status to other antibiotic agents status: Secondary | ICD-10-CM | POA: Diagnosis not present

## 2019-10-05 DIAGNOSIS — M8580 Other specified disorders of bone density and structure, unspecified site: Secondary | ICD-10-CM | POA: Insufficient documentation

## 2019-10-05 DIAGNOSIS — Z6835 Body mass index (BMI) 35.0-35.9, adult: Secondary | ICD-10-CM | POA: Insufficient documentation

## 2019-10-05 DIAGNOSIS — I10 Essential (primary) hypertension: Secondary | ICD-10-CM | POA: Diagnosis not present

## 2019-10-05 DIAGNOSIS — E669 Obesity, unspecified: Secondary | ICD-10-CM | POA: Insufficient documentation

## 2019-10-05 DIAGNOSIS — Y92009 Unspecified place in unspecified non-institutional (private) residence as the place of occurrence of the external cause: Secondary | ICD-10-CM | POA: Diagnosis not present

## 2019-10-05 DIAGNOSIS — Z8781 Personal history of (healed) traumatic fracture: Secondary | ICD-10-CM

## 2019-10-05 DIAGNOSIS — E785 Hyperlipidemia, unspecified: Secondary | ICD-10-CM | POA: Insufficient documentation

## 2019-10-05 HISTORY — PX: OPEN REDUCTION INTERNAL FIXATION (ORIF) DISTAL RADIAL FRACTURE: SHX5989

## 2019-10-05 LAB — POCT I-STAT, CHEM 8
BUN: 8 mg/dL (ref 8–23)
Calcium, Ion: 1.12 mmol/L — ABNORMAL LOW (ref 1.15–1.40)
Chloride: 100 mmol/L (ref 98–111)
Creatinine, Ser: 0.5 mg/dL (ref 0.44–1.00)
Glucose, Bld: 76 mg/dL (ref 70–99)
HCT: 37 % (ref 36.0–46.0)
Hemoglobin: 12.6 g/dL (ref 12.0–15.0)
Potassium: 3.7 mmol/L (ref 3.5–5.1)
Sodium: 134 mmol/L — ABNORMAL LOW (ref 135–145)
TCO2: 27 mmol/L (ref 22–32)

## 2019-10-05 SURGERY — OPEN REDUCTION INTERNAL FIXATION (ORIF) DISTAL RADIUS FRACTURE
Anesthesia: General | Laterality: Right

## 2019-10-05 MED ORDER — METOCLOPRAMIDE HCL 10 MG PO TABS: 5.0000 mg | ORAL_TABLET | Freq: Three times a day (TID) | ORAL | Status: DC | PRN

## 2019-10-05 MED ORDER — ONDANSETRON HCL 4 MG/2ML IJ SOLN: 4.0000 mg | Freq: Once | INTRAMUSCULAR | Status: DC | PRN

## 2019-10-05 MED ORDER — HYDROCODONE-ACETAMINOPHEN 5-325 MG PO TABS
1.0000 | ORAL_TABLET | Freq: Once | ORAL | Status: AC
  Administered 2019-10-05: 1 via ORAL

## 2019-10-05 MED ORDER — HYDROCODONE-ACETAMINOPHEN 5-325 MG PO TABS
ORAL_TABLET | ORAL | Status: AC
  Filled 2019-10-05: qty 1

## 2019-10-05 MED ORDER — METOCLOPRAMIDE HCL 5 MG/ML IJ SOLN: 5.0000 mg | Freq: Three times a day (TID) | INTRAMUSCULAR | Status: DC | PRN

## 2019-10-05 MED ORDER — NEOMYCIN-POLYMYXIN B GU 40-200000 IR SOLN
Status: DC | PRN
  Administered 2019-10-05: 2 mL

## 2019-10-05 MED ORDER — FENTANYL CITRATE (PF) 100 MCG/2ML IJ SOLN
INTRAMUSCULAR | Status: AC
  Administered 2019-10-05: 09:00:00 25 ug via INTRAVENOUS
  Filled 2019-10-05: qty 2

## 2019-10-05 MED ORDER — HYDROCODONE-ACETAMINOPHEN 5-325 MG PO TABS: 1.0000 | ORAL_TABLET | Freq: Four times a day (QID) | ORAL | 0 refills | Status: DC | PRN

## 2019-10-05 MED ORDER — DEXAMETHASONE SODIUM PHOSPHATE 10 MG/ML IJ SOLN
INTRAMUSCULAR | Status: DC | PRN
  Administered 2019-10-05: 10 mg via INTRAVENOUS

## 2019-10-05 MED ORDER — ONDANSETRON HCL 4 MG PO TABS: 4.0000 mg | ORAL_TABLET | Freq: Four times a day (QID) | ORAL | Status: DC | PRN

## 2019-10-05 MED ORDER — ONDANSETRON HCL 4 MG/2ML IJ SOLN
INTRAMUSCULAR | Status: DC | PRN
  Administered 2019-10-05: 4 mg via INTRAVENOUS

## 2019-10-05 MED ORDER — HYDROMORPHONE HCL 1 MG/ML IJ SOLN
INTRAMUSCULAR | Status: AC
  Administered 2019-10-05: 0.5 mg via INTRAVENOUS
  Filled 2019-10-05: qty 1

## 2019-10-05 MED ORDER — PROPOFOL 10 MG/ML IV BOLUS
INTRAVENOUS | Status: DC | PRN
  Administered 2019-10-05: 130 mg via INTRAVENOUS

## 2019-10-05 MED ORDER — LIDOCAINE HCL (CARDIAC) PF 100 MG/5ML IV SOSY
PREFILLED_SYRINGE | INTRAVENOUS | Status: DC | PRN
  Administered 2019-10-05: 80 mg via INTRAVENOUS

## 2019-10-05 MED ORDER — HYDROMORPHONE HCL 1 MG/ML IJ SOLN
0.5000 mg | INTRAMUSCULAR | Status: DC | PRN
  Administered 2019-10-05: 0.5 mg via INTRAVENOUS

## 2019-10-05 MED ORDER — SUGAMMADEX SODIUM 200 MG/2ML IV SOLN
INTRAVENOUS | Status: DC | PRN
  Administered 2019-10-05: 200 mg via INTRAVENOUS

## 2019-10-05 MED ORDER — FENTANYL CITRATE (PF) 100 MCG/2ML IJ SOLN
INTRAMUSCULAR | Status: DC | PRN
  Administered 2019-10-05 (×4): 50 ug via INTRAVENOUS

## 2019-10-05 MED ORDER — CEFAZOLIN SODIUM-DEXTROSE 2-4 GM/100ML-% IV SOLN
2.0000 g | INTRAVENOUS | Status: AC
  Administered 2019-10-05: 08:00:00 2 g via INTRAVENOUS

## 2019-10-05 MED ORDER — CHLORHEXIDINE GLUCONATE 4 % EX LIQD
60.0000 mL | Freq: Once | CUTANEOUS | Status: AC
  Administered 2019-10-05: 4 via TOPICAL

## 2019-10-05 MED ORDER — CEFAZOLIN SODIUM-DEXTROSE 2-4 GM/100ML-% IV SOLN
INTRAVENOUS | Status: AC
  Filled 2019-10-05: qty 100

## 2019-10-05 MED ORDER — NEOMYCIN-POLYMYXIN B GU 40-200000 IR SOLN
Status: AC
  Filled 2019-10-05: qty 20

## 2019-10-05 MED ORDER — PROPOFOL 10 MG/ML IV BOLUS
INTRAVENOUS | Status: AC
  Filled 2019-10-05: qty 20

## 2019-10-05 MED ORDER — FENTANYL CITRATE (PF) 100 MCG/2ML IJ SOLN
25.0000 ug | INTRAMUSCULAR | Status: DC | PRN
  Administered 2019-10-05 (×2): 25 ug via INTRAVENOUS

## 2019-10-05 MED ORDER — FENTANYL CITRATE (PF) 100 MCG/2ML IJ SOLN
INTRAMUSCULAR | Status: AC
  Filled 2019-10-05: qty 2

## 2019-10-05 MED ORDER — LACTATED RINGERS IV SOLN: INTRAVENOUS | Status: DC

## 2019-10-05 MED ORDER — ROCURONIUM BROMIDE 100 MG/10ML IV SOLN
INTRAVENOUS | Status: DC | PRN
  Administered 2019-10-05: 5 mg via INTRAVENOUS

## 2019-10-05 MED ORDER — ONDANSETRON HCL 4 MG/2ML IJ SOLN: 4.0000 mg | Freq: Four times a day (QID) | INTRAMUSCULAR | Status: DC | PRN

## 2019-10-05 MED ORDER — SODIUM CHLORIDE 0.9 % IV SOLN: INTRAVENOUS | Status: DC

## 2019-10-05 MED ORDER — SUCCINYLCHOLINE CHLORIDE 20 MG/ML IJ SOLN
INTRAMUSCULAR | Status: DC | PRN
  Administered 2019-10-05: 100 mg via INTRAVENOUS

## 2019-10-05 SURGICAL SUPPLY — 40 items
BIT DRILL 2 FAST STEP (BIT) ×2 IMPLANT
BIT DRILL 2.5X4 QC (BIT) ×2 IMPLANT
BNDG ELASTIC 4X5.8 VLCR STR LF (GAUZE/BANDAGES/DRESSINGS) ×3 IMPLANT
CANISTER SUCT 1200ML W/VALVE (MISCELLANEOUS) ×3 IMPLANT
CHLORAPREP W/TINT 26 (MISCELLANEOUS) ×3 IMPLANT
COVER WAND RF STERILE (DRAPES) ×3 IMPLANT
CUFF TOURN SGL QUICK 18X4 (TOURNIQUET CUFF) ×2 IMPLANT
DRAPE FLUOR MINI C-ARM 54X84 (DRAPES) ×3 IMPLANT
ELECT REM PT RETURN 9FT ADLT (ELECTROSURGICAL) ×3
ELECTRODE REM PT RTRN 9FT ADLT (ELECTROSURGICAL) ×1 IMPLANT
GAUZE SPONGE 4X4 12PLY STRL (GAUZE/BANDAGES/DRESSINGS) ×3 IMPLANT
GAUZE XEROFORM 1X8 LF (GAUZE/BANDAGES/DRESSINGS) ×6 IMPLANT
GLOVE SURG SYN 9.0  PF PI (GLOVE) ×2
GLOVE SURG SYN 9.0 PF PI (GLOVE) ×1 IMPLANT
GOWN SRG 2XL LVL 4 RGLN SLV (GOWNS) ×1 IMPLANT
GOWN STRL NON-REIN 2XL LVL4 (GOWNS) ×2
GOWN STRL REUS W/ TWL LRG LVL3 (GOWN DISPOSABLE) ×1 IMPLANT
GOWN STRL REUS W/TWL LRG LVL3 (GOWN DISPOSABLE) ×2
K-WIRE 1.6 (WIRE) ×2
K-WIRE FX5X1.6XNS BN SS (WIRE) ×1
KIT TURNOVER KIT A (KITS) ×3 IMPLANT
KWIRE FX5X1.6XNS BN SS (WIRE) IMPLANT
NDL FILTER BLUNT 18X1 1/2 (NEEDLE) ×1 IMPLANT
NEEDLE FILTER BLUNT 18X 1/2SAF (NEEDLE) ×2
NEEDLE FILTER BLUNT 18X1 1/2 (NEEDLE) ×1 IMPLANT
NS IRRIG 500ML POUR BTL (IV SOLUTION) ×3 IMPLANT
PACK EXTREMITY ARMC (MISCELLANEOUS) ×3 IMPLANT
PAD CAST CTTN 4X4 STRL (SOFTGOODS) ×2 IMPLANT
PADDING CAST COTTON 4X4 STRL (SOFTGOODS) ×4
PEG SUBCHONDRAL SMOOTH 2.0X16 (Peg) ×2 IMPLANT
PEG SUBCHONDRAL SMOOTH 2.0X20 (Peg) ×2 IMPLANT
PEG SUBCHONDRAL SMOOTH 2.0X22 (Peg) ×2 IMPLANT
SCALPEL PROTECTED #15 DISP (BLADE) ×6 IMPLANT
SCREW CORT 3.5X10 LNG (Screw) ×6 IMPLANT
SPLINT CAST 1 STEP 3X12 (MISCELLANEOUS) ×3 IMPLANT
SUT ETHILON 4-0 (SUTURE) ×2
SUT ETHILON 4-0 FS2 18XMFL BLK (SUTURE) ×1
SUT VICRYL 3-0 27IN (SUTURE) ×3 IMPLANT
SUTURE ETHLN 4-0 FS2 18XMF BLK (SUTURE) ×1 IMPLANT
SYR 3ML LL SCALE MARK (SYRINGE) ×3 IMPLANT

## 2019-10-05 NOTE — Transfer of Care (Signed)
Immediate Anesthesia Transfer of Care Note  Patient: Andrea Griffin  Procedure(s) Performed: OPEN REDUCTION INTERNAL FIXATION (ORIF) DISTAL RADIAL FRACTURE (Right )  Patient Location: PACU  Anesthesia Type:General  Level of Consciousness: awake, alert  and oriented  Airway & Oxygen Therapy: Patient Spontanous Breathing and Patient connected to face mask oxygen  Post-op Assessment: Report given to RN and Post -op Vital signs reviewed and stable  Post vital signs: Reviewed and stable  Last Vitals:  Vitals Value Taken Time  BP 156/56 10/05/19 0846  Temp 36.3 C 10/05/19 0846  Pulse 78 10/05/19 0849  Resp 15 10/05/19 0849  SpO2 100 % 10/05/19 0849  Vitals shown include unvalidated device data.  Last Pain:  Vitals:   10/05/19 0612  TempSrc: Tympanic  PainSc: 0-No pain      Patients Stated Pain Goal: 0 ([id number])  Complications: No apparent anesthesia complications

## 2019-10-05 NOTE — Anesthesia Preprocedure Evaluation (Signed)
Anesthesia Evaluation  Patient identified by MRN, date of birth, ID band Patient awake    Reviewed: Allergy & Precautions, NPO status , Patient's Chart, lab work & pertinent test results  History of Anesthesia Complications Negative for: history of anesthetic complications  Airway Mallampati: III       Dental   Pulmonary asthma , sleep apnea (diagnosed, not treated) , neg COPD, Not current smoker,           Cardiovascular hypertension, Pt. on medications (-) Past MI and (-) CHF (-) dysrhythmias (-) Valvular Problems/Murmurs     Neuro/Psych neg Seizures Anxiety    GI/Hepatic Neg liver ROS, GERD  Medicated and Controlled,  Endo/Other  neg diabetes  Renal/GU negative Renal ROS     Musculoskeletal   Abdominal   Peds  Hematology   Anesthesia Other Findings   Reproductive/Obstetrics                             Anesthesia Physical Anesthesia Plan  ASA: III  Anesthesia Plan: General   Post-op Pain Management:    Induction: Intravenous  PONV Risk Score and Plan: 3 and Ondansetron, Dexamethasone and Treatment may vary due to age or medical condition  Airway Management Planned: Oral ETT  Additional Equipment:   Intra-op Plan:   Post-operative Plan:   Informed Consent: I have reviewed the patients History and Physical, chart, labs and discussed the procedure including the risks, benefits and alternatives for the proposed anesthesia with the patient or authorized representative who has indicated his/her understanding and acceptance.       Plan Discussed with:   Anesthesia Plan Comments:         Anesthesia Quick Evaluation

## 2019-10-05 NOTE — H&P (Signed)
Reviewed paper H+P, will be scanned into chart. No changes noted.  

## 2019-10-05 NOTE — Discharge Instructions (Addendum)
  Norco 5/325mg  taken February 4 at 10am  Keep arm elevated is much as possible.  Ice to the back of the wrist today and tomorrow.  Work on finger motion especially at the knuckles.  Pain medicine as directed.  Call office if you are having problems.   AMBULATORY SURGERY  DISCHARGE INSTRUCTIONS   1) The drugs that you were given will stay in your system until tomorrow so for the next 24 hours you should not:  A) Drive an automobile B) Make any legal decisions C) Drink any alcoholic beverage   2) You may resume regular meals tomorrow.  Today it is better to start with liquids and gradually work up to solid foods.  You may eat anything you prefer, but it is better to start with liquids, then soup and crackers, and gradually work up to solid foods.   3) Please notify your doctor immediately if you have any unusual bleeding, trouble breathing, redness and pain at the surgery site, drainage, fever, or pain not relieved by medication.    4) Additional Instructions:        Please contact your physician with any problems or Same Day Surgery at [phone], Monday through Friday 6 am to 4 pm, or [place] at [facility] number at [phone].

## 2019-10-05 NOTE — Anesthesia Procedure Notes (Signed)
Procedure Name: Intubation Performed by: Fredderick Phenix, CRNA Pre-anesthesia Checklist: Patient identified, Emergency Drugs available, Suction available and Patient being monitored Patient Re-evaluated:Patient Re-evaluated prior to induction Oxygen Delivery Method: Circle system utilized Preoxygenation: Pre-oxygenation with 100% oxygen Induction Type: IV induction Laryngoscope Size: Glidescope Tube type: Oral Number of attempts: 1 Airway Equipment and Method: Bougie stylet,  Patient positioned with wedge pillow and Video-laryngoscopy (unable to pass ETT due to patients anterior airway.  flexibl bougie with head lift and anterior laryngeal pressure. ) Placement Confirmation: ETT inserted through vocal cords under direct vision,  positive ETCO2 and breath sounds checked- equal and bilateral Secured at: 22 cm Tube secured with: Tape Dental Injury: Teeth and Oropharynx as per pre-operative assessment

## 2019-10-05 NOTE — Anesthesia Postprocedure Evaluation (Signed)
Anesthesia Post Note  Patient: Andrea Griffin  Procedure(s) Performed: OPEN REDUCTION INTERNAL FIXATION (ORIF) DISTAL RADIAL FRACTURE (Right )  Patient location during evaluation: PACU Anesthesia Type: General Level of consciousness: awake and alert Pain management: pain level controlled Vital Signs Assessment: post-procedure vital signs reviewed and stable Respiratory status: spontaneous breathing and respiratory function stable Cardiovascular status: stable Anesthetic complications: no     Last Vitals:  Vitals:   10/05/19 0946 10/05/19 0954  BP: (!) 144/65   Pulse: 84 79  Resp: 19 17  Temp:    SpO2: 97% 94%    Last Pain:  Vitals:   10/05/19 0954  TempSrc:   PainSc: 5                  Louvina Cleary K

## 2019-10-05 NOTE — Op Note (Signed)
10/05/2019  8:39 AM  PATIENT:  Andrea Griffin  86 y.o. female  PRE-OPERATIVE DIAGNOSIS:  ACUTE PAIN RIGHT WRIST comminuted distal intra-articular radius fracture  POST-OPERATIVE DIAGNOSIS: Same  PROCEDURE:  Procedure(s): OPEN REDUCTION INTERNAL FIXATION (ORIF) DISTAL RADIAL FRACTURE (Right)  SURGEON: Laurene Footman, MD  ASSISTANTS: None  ANESTHESIA:   general  EBL:  Total I/O In: 100 [IV Piggyback:100] Out: 5 [Blood:5]  BLOOD ADMINISTERED:none  DRAINS: none   LOCAL MEDICATIONS USED:  NONE  SPECIMEN:  No Specimen  DISPOSITION OF SPECIMEN:  N/A  COUNTS:  YES  TOURNIQUET:   Total Tourniquet Time Documented: Upper Arm (Right) - 26 minutes Total: Upper Arm (Right) - 26 minutes   IMPLANTS: [employer] short narrow DVR plate with multiple smooth pegs and screws  DICTATION: .Dragon Dictation patient was brought to the operating room and after adequate general anesthesia was obtained the right arm was prepped and draped in the usual sterile fashion.  After patient identification and timeout procedures were completed, tourniquet was raised.  A volar approach was used over the FCR tendon and the tendon sheath incised with the tendon retracted radially.  Deep fascia incised and the pronator elevated off the radial border of the distal shaft and distal fragment.  With this exposure and fingertrap traction had been applied at the beginning of the case some of the length was restored and with a freer elevator the distal fragment could be disimpacted from the shaft and then held in a reduced position under mini C arm views.  A distal first approach was then applied with the plate being applied distally with multiple smooth pegs placed.  The plate was then brought down to the shaft and 310 mm screws inserted.  There is no penetration into the joint with a smooth pegs.  AP lateral and oblique images used to assess this.  Traction was removed and under fluoroscopic views the fracture was  stable.  The wound was irrigated and tourniquet let down.  The wound was then closed with 3-0 Vicryl subcutaneously followed by 4-0 nylon in a simple interrupted fashion.  Xeroform 4 x 4 web roll and a volar splint applied followed by an Ace wrap  PLAN OF CARE: Discharge to home after PACU  PATIENT DISPOSITION:  PACU - hemodynamically stable.

## 2020-02-07 ENCOUNTER — Ambulatory Visit: Payer: Medicare PPO | Admitting: Dermatology

## 2020-02-07 ENCOUNTER — Other Ambulatory Visit: Payer: Self-pay

## 2020-02-07 DIAGNOSIS — L03012 Cellulitis of left finger: Secondary | ICD-10-CM

## 2020-02-07 DIAGNOSIS — L304 Erythema intertrigo: Secondary | ICD-10-CM | POA: Diagnosis not present

## 2020-02-07 DIAGNOSIS — L409 Psoriasis, unspecified: Secondary | ICD-10-CM | POA: Diagnosis not present

## 2020-02-07 NOTE — Progress Notes (Signed)
   Follow-Up Visit   Subjective  Andrea Griffin is a 86 y.o. female who presents for the following: Psoriasis (3 months f/u psorisasis on the fingernails, pt using Clobetasol solution with good response ) and hx of Intertrigo (pt report hx of Intertrigo under her arms, well controlled ).   The following portions of the chart were reviewed this encounter and updated as appropriate:  Tobacco  Allergies  Meds  Problems  Med Hx  Surg Hx  Fam Hx      Review of Systems:  No other skin or systemic complaints except as noted in HPI or Assessment and Plan.  Objective  Well appearing patient in no apparent distress; mood and affect are within normal limits.  A focused examination was performed including fingernails, axilla . Relevant physical exam findings are noted in the Assessment and Plan.  Objective  Left ring finger: Clear skin today   Objective  fingernails: Dystrophy of the distal nails   Objective  Axillary: Clear skin today    Assessment & Plan    Paronychia of finger of left hand (inflammation, but no infection) Left ring finger  Cont Clobetasol solution qd-bid prn   Psoriasis -with fingernail involvement. fingernails  Psoriasis Improved No cure can only control  Cont Clobetasol solution qd-bid prn   Avoid UV light on nails, can use nail polish that air dry.  There may have been history of photo contact dermatitis from UV cured nail polish causing onycholysis in the past.  Erythema intertrigo Axillary  Hx of Intertrigo, well controlled today   Return in about 7 months (around 09/08/2020).  IMarye Round, CMA, am acting as scribe for Sarina Ser, MD .  Documentation: I have reviewed the above documentation for accuracy and completeness, and I agree with the above.  Sarina Ser, MD

## 2020-02-13 ENCOUNTER — Encounter: Payer: Self-pay | Admitting: Dermatology

## 2020-09-11 ENCOUNTER — Ambulatory Visit: Payer: Medicare PPO | Admitting: Dermatology

## 2020-09-18 ENCOUNTER — Ambulatory Visit: Payer: Medicare PPO | Admitting: Dermatology

## 2021-06-26 ENCOUNTER — Other Ambulatory Visit: Payer: Self-pay | Admitting: Family Medicine

## 2021-06-26 DIAGNOSIS — Z1231 Encounter for screening mammogram for malignant neoplasm of breast: Secondary | ICD-10-CM

## 2021-07-17 ENCOUNTER — Ambulatory Visit
Admission: RE | Admit: 2021-07-17 | Discharge: 2021-07-17 | Disposition: A | Discharge status: Home / Self Care | Payer: Medicare PPO | Source: Ambulatory Visit | Attending: Family Medicine | Admitting: Family Medicine

## 2021-07-17 ENCOUNTER — Other Ambulatory Visit: Payer: Self-pay

## 2021-07-17 DIAGNOSIS — Z1231 Encounter for screening mammogram for malignant neoplasm of breast: Secondary | ICD-10-CM

## 2023-04-18 IMAGING — MG MM DIGITAL SCREENING BILAT W/ TOMO AND CAD
6 of 10 series · 6 of 30 positions shown · non-contrast
Comparison: Previous exam(s).

CLINICAL DATA: Screening.

EXAM:
DIGITAL SCREENING BILATERAL MAMMOGRAM WITH TOMOSYNTHESIS AND CAD
TECHNIQUE: Bilateral screening digital craniocaudal and mediolateral oblique
mammograms were obtained. Bilateral screening digital breast
tomosynthesis was performed. The images were evaluated with
computer-aided detection.

[L CC synth-2D]
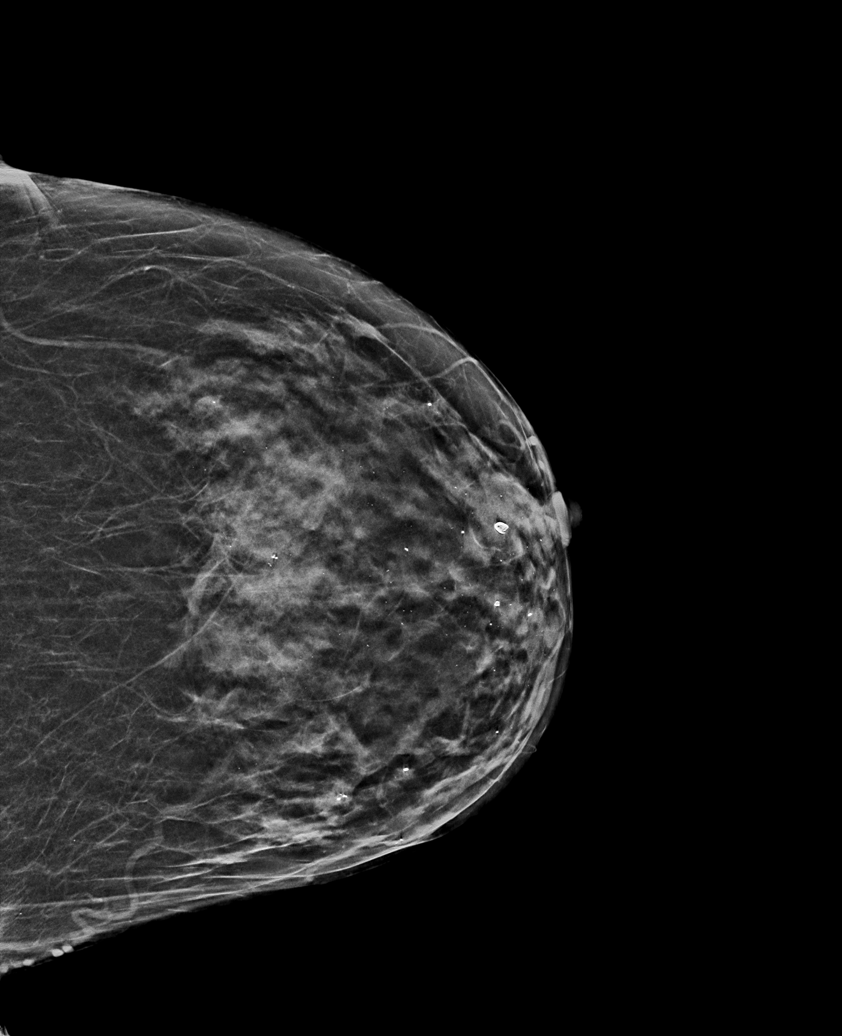

[L MLO synth-2D (1 of 2)]
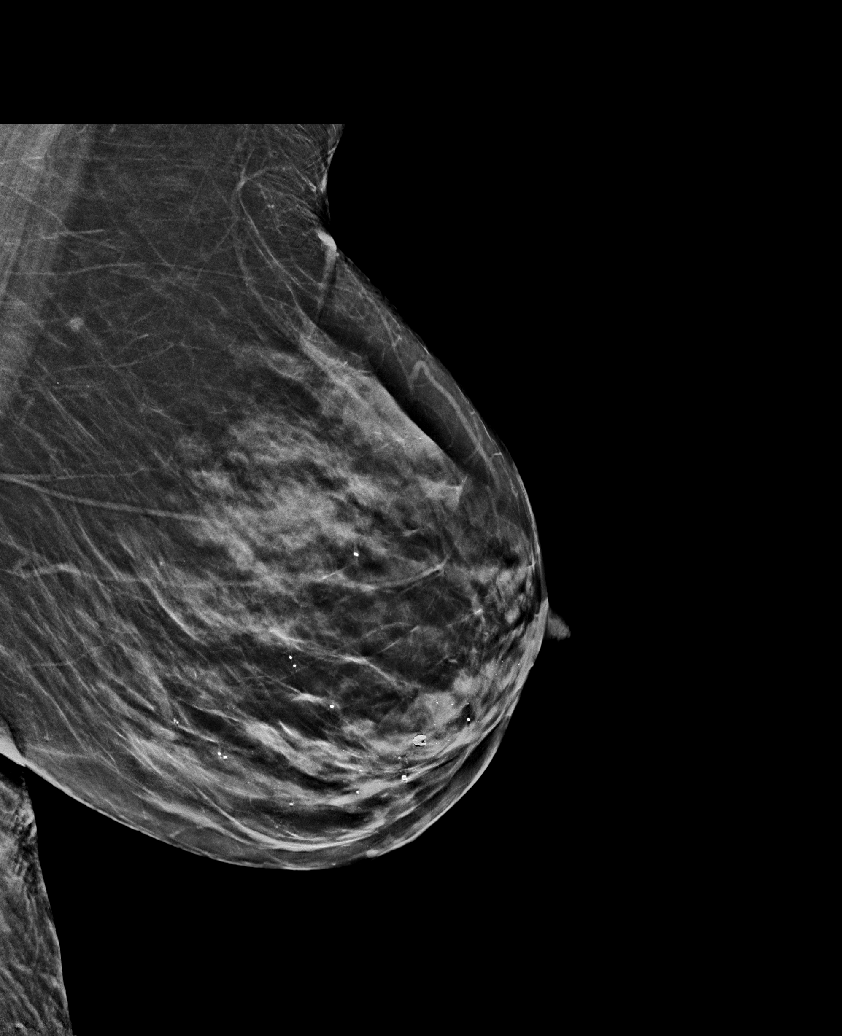

[L MLO synth-2D (2 of 2)]
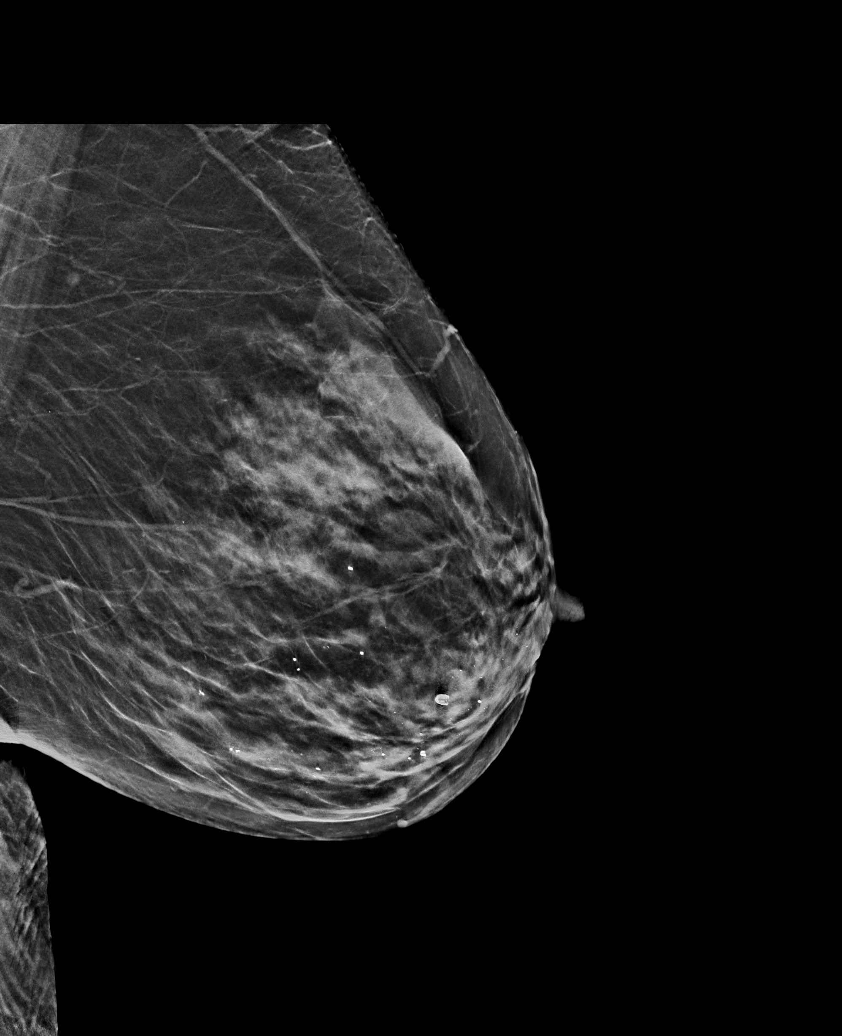

[R MLO synth-2D]
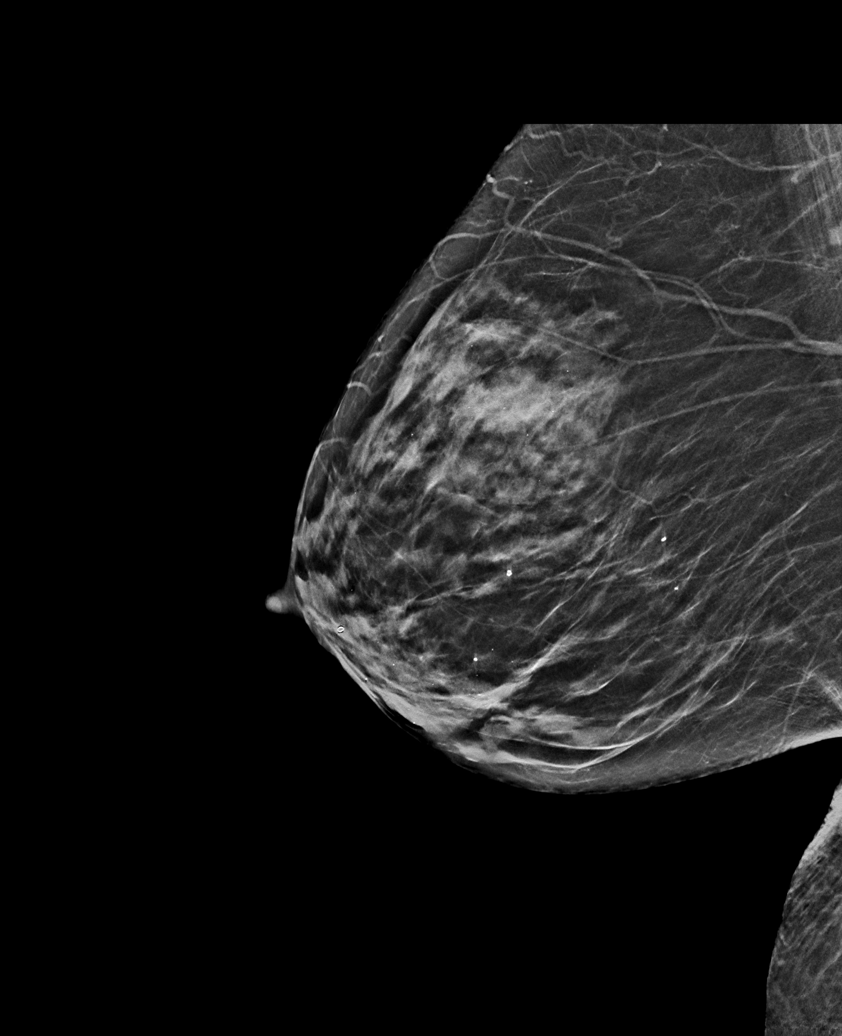

[R CC synth-2D]
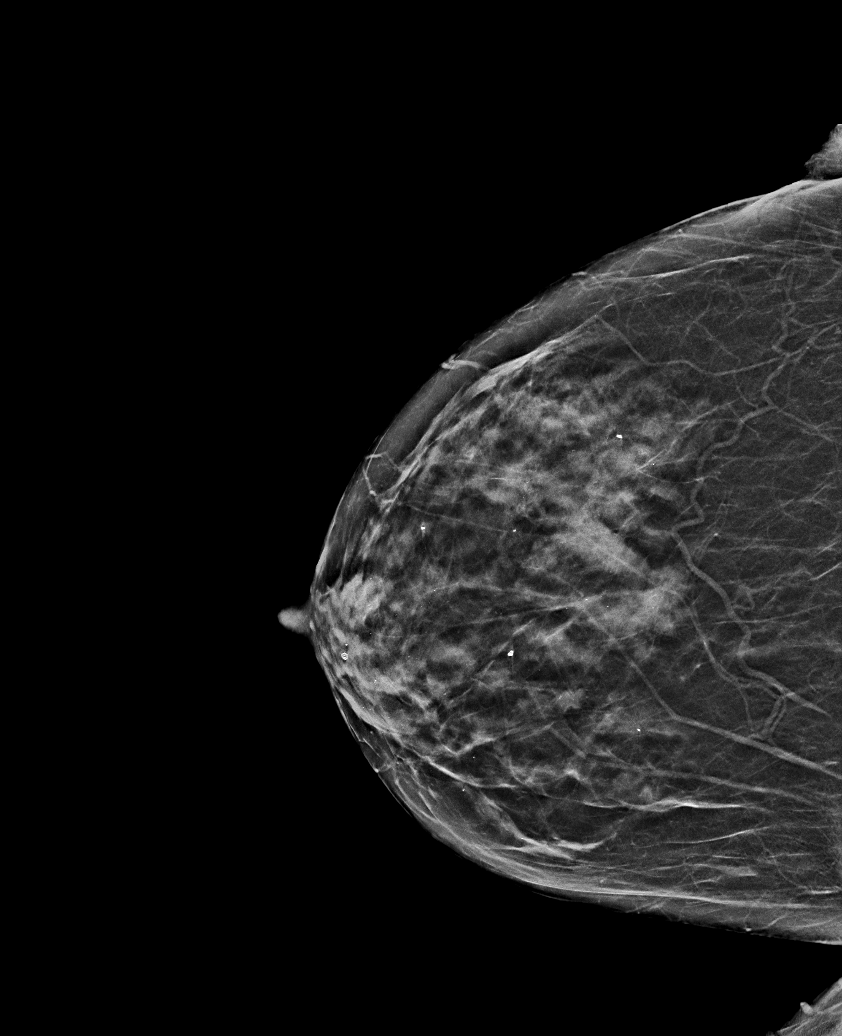

[R CC tomo · tomo slice 25/49.0]
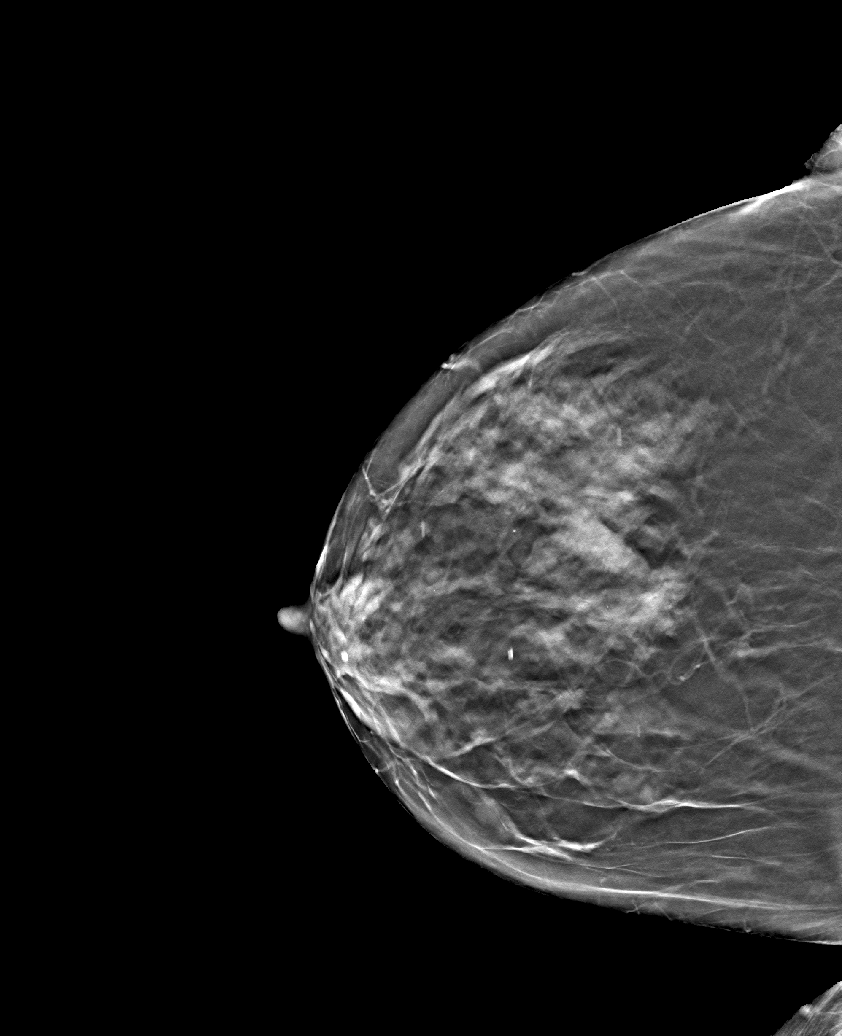

[6 of 30 positions shown; findings below may reference images not displayed]

ACR Breast Density Category c: The breast tissue is heterogeneously
dense, which may obscure small masses.
FINDINGS: There are no findings suspicious for malignancy.
IMPRESSION: No mammographic evidence of malignancy. A result letter of this
screening mammogram will be mailed directly to the patient.

RECOMMENDATION:
Screening mammogram in one year. (Code:Q3-W-BC3)

BI-RADS CATEGORY  1: Negative.

## 2023-05-04 ENCOUNTER — Encounter: Payer: Self-pay | Admitting: Emergency Medicine

## 2023-05-04 ENCOUNTER — Other Ambulatory Visit: Payer: Self-pay

## 2023-05-04 ENCOUNTER — Inpatient Hospital Stay
Admission: EM | Admit: 2023-05-04 | Discharge: 2023-05-07 | DRG: 641 | Disposition: A | Discharge status: Home / Self Care | Payer: Medicare PPO | Attending: Internal Medicine | Admitting: Internal Medicine

## 2023-05-04 ENCOUNTER — Inpatient Hospital Stay: Payer: Medicare PPO

## 2023-05-04 DIAGNOSIS — Z981 Arthrodesis status: Secondary | ICD-10-CM | POA: Diagnosis not present

## 2023-05-04 DIAGNOSIS — M8580 Other specified disorders of bone density and structure, unspecified site: Secondary | ICD-10-CM | POA: Diagnosis present

## 2023-05-04 DIAGNOSIS — E785 Hyperlipidemia, unspecified: Secondary | ICD-10-CM | POA: Diagnosis present

## 2023-05-04 DIAGNOSIS — Z888 Allergy status to other drugs, medicaments and biological substances status: Secondary | ICD-10-CM | POA: Diagnosis not present

## 2023-05-04 DIAGNOSIS — E871 Hypo-osmolality and hyponatremia: Secondary | ICD-10-CM | POA: Diagnosis not present

## 2023-05-04 DIAGNOSIS — Z85820 Personal history of malignant melanoma of skin: Secondary | ICD-10-CM | POA: Diagnosis not present

## 2023-05-04 DIAGNOSIS — Z7951 Long term (current) use of inhaled steroids: Secondary | ICD-10-CM | POA: Diagnosis not present

## 2023-05-04 DIAGNOSIS — Z823 Family history of stroke: Secondary | ICD-10-CM | POA: Diagnosis not present

## 2023-05-04 DIAGNOSIS — I10 Essential (primary) hypertension: Secondary | ICD-10-CM | POA: Insufficient documentation

## 2023-05-04 DIAGNOSIS — I2510 Atherosclerotic heart disease of native coronary artery without angina pectoris: Secondary | ICD-10-CM | POA: Diagnosis present

## 2023-05-04 DIAGNOSIS — E876 Hypokalemia: Secondary | ICD-10-CM | POA: Diagnosis present

## 2023-05-04 DIAGNOSIS — Z96643 Presence of artificial hip joint, bilateral: Secondary | ICD-10-CM | POA: Diagnosis present

## 2023-05-04 DIAGNOSIS — E0590 Thyrotoxicosis, unspecified without thyrotoxic crisis or storm: Secondary | ICD-10-CM | POA: Insufficient documentation

## 2023-05-04 DIAGNOSIS — F419 Anxiety disorder, unspecified: Secondary | ICD-10-CM | POA: Diagnosis present

## 2023-05-04 DIAGNOSIS — Z91048 Other nonmedicinal substance allergy status: Secondary | ICD-10-CM | POA: Diagnosis not present

## 2023-05-04 DIAGNOSIS — Z79899 Other long term (current) drug therapy: Secondary | ICD-10-CM | POA: Diagnosis not present

## 2023-05-04 DIAGNOSIS — Z85828 Personal history of other malignant neoplasm of skin: Secondary | ICD-10-CM

## 2023-05-04 DIAGNOSIS — I119 Hypertensive heart disease without heart failure: Secondary | ICD-10-CM | POA: Diagnosis present

## 2023-05-04 DIAGNOSIS — J45909 Unspecified asthma, uncomplicated: Secondary | ICD-10-CM | POA: Diagnosis present

## 2023-05-04 DIAGNOSIS — Z882 Allergy status to sulfonamides status: Secondary | ICD-10-CM

## 2023-05-04 DIAGNOSIS — R54 Age-related physical debility: Secondary | ICD-10-CM | POA: Diagnosis present

## 2023-05-04 DIAGNOSIS — Z66 Do not resuscitate: Secondary | ICD-10-CM | POA: Diagnosis present

## 2023-05-04 DIAGNOSIS — J849 Interstitial pulmonary disease, unspecified: Secondary | ICD-10-CM | POA: Diagnosis present

## 2023-05-04 DIAGNOSIS — K219 Gastro-esophageal reflux disease without esophagitis: Secondary | ICD-10-CM | POA: Insufficient documentation

## 2023-05-04 DIAGNOSIS — D72829 Elevated white blood cell count, unspecified: Secondary | ICD-10-CM | POA: Insufficient documentation

## 2023-05-04 LAB — BASIC METABOLIC PANEL
Anion gap: 16 — ABNORMAL HIGH (ref 5–15)
BUN: 18 mg/dL (ref 8–23)
CO2: 21 mmol/L — ABNORMAL LOW (ref 22–32)
Calcium: 8.7 mg/dL — ABNORMAL LOW (ref 8.9–10.3)
Chloride: 77 mmol/L — ABNORMAL LOW (ref 98–111)
Creatinine, Ser: 0.64 mg/dL (ref 0.44–1.00)
GFR, Estimated: >60 mL/min (ref >60)
Glucose, Bld: 91 mg/dL (ref 70–99)
Potassium: 4 mmol/L (ref 3.5–5.1)
Sodium: 114 mmol/L — CL (ref 135–145)

## 2023-05-04 LAB — SODIUM: Sodium: 114 mmol/L — CL (ref 135–145)

## 2023-05-04 LAB — OSMOLALITY: Osmolality: 245 mosm/kg — CL (ref 275–295)

## 2023-05-04 LAB — MAGNESIUM: Magnesium: 1.8 mg/dL (ref 1.7–2.4)

## 2023-05-04 MED ORDER — SODIUM CHLORIDE 0.9 % IV SOLN
500.0000 mg | INTRAVENOUS | Status: DC
  Administered 2023-05-04: 500 mg via INTRAVENOUS
  Filled 2023-05-04: qty 5

## 2023-05-04 MED ORDER — SODIUM CHLORIDE 0.9 % IV BOLUS
1000.0000 mL | Freq: Once | INTRAVENOUS | Status: AC
  Administered 2023-05-04: 1000 mL via INTRAVENOUS

## 2023-05-04 MED ORDER — SODIUM CHLORIDE 0.9 % IV SOLN: INTRAVENOUS | Status: DC

## 2023-05-04 MED ORDER — ACETAMINOPHEN 325 MG PO TABS
650.0000 mg | ORAL_TABLET | Freq: Four times a day (QID) | ORAL | Status: DC | PRN
  Administered 2023-05-05 – 2023-05-06 (×3): 650 mg via ORAL
  Filled 2023-05-04 (×3): qty 2

## 2023-05-04 MED ORDER — SENNOSIDES-DOCUSATE SODIUM 8.6-50 MG PO TABS: 1.0000 | ORAL_TABLET | Freq: Every evening | ORAL | Status: DC | PRN

## 2023-05-04 MED ORDER — ONDANSETRON HCL 4 MG PO TABS: 4.0000 mg | ORAL_TABLET | Freq: Four times a day (QID) | ORAL | Status: DC | PRN

## 2023-05-04 MED ORDER — HYDRALAZINE HCL 20 MG/ML IJ SOLN: 5.0000 mg | Freq: Three times a day (TID) | INTRAMUSCULAR | Status: DC | PRN

## 2023-05-04 MED ORDER — ENOXAPARIN SODIUM 40 MG/0.4ML IJ SOSY
40.0000 mg | PREFILLED_SYRINGE | INTRAMUSCULAR | Status: DC
  Administered 2023-05-04 – 2023-05-06 (×3): 40 mg via SUBCUTANEOUS
  Filled 2023-05-04 (×3): qty 0.4

## 2023-05-04 MED ORDER — SODIUM CHLORIDE 0.9 % IV SOLN
2.0000 g | INTRAVENOUS | Status: DC
  Administered 2023-05-04 – 2023-05-06 (×3): 2 g via INTRAVENOUS
  Filled 2023-05-04 (×3): qty 20

## 2023-05-04 MED ORDER — ACETAMINOPHEN 650 MG RE SUPP: 650.0000 mg | Freq: Four times a day (QID) | RECTAL | Status: DC | PRN

## 2023-05-04 MED ORDER — ONDANSETRON HCL 4 MG/2ML IJ SOLN: 4.0000 mg | Freq: Four times a day (QID) | INTRAMUSCULAR | Status: DC | PRN

## 2023-05-04 NOTE — Assessment & Plan Note (Signed)
Confirmed with patient at bedside

## 2023-05-04 NOTE — ED Notes (Signed)
When this RN entered the room, the pt's IV of NS was clamped and pt hadn't gotten any of her fluids.  IV was unclamped and flushed to allow for fluids to run. Sodium levels changed to reflect pt's labs after Saline bolus BP cuff was lose as well and adjusted. Dr. Sedalia Muta in to see pt when this RN in to see pt as well

## 2023-05-04 NOTE — Assessment & Plan Note (Addendum)
Severe hyponatremia with mild symptoms Etiology workup in progress Patient is status post sodium chloride 1 L bolus per EDP Serum sodium monitoring every 2 hours, 3 times ordered on admission Goal increase is 6-8 in 24 hours Check serum osmolality, urine osmolality, urine sodium, UA with complete microscopic, Legionella pneumophelia in urine antigen Nephrology specialist has been consulted via staff message and Epic order Admit to stepdown, inpatient  Addendum: First repeat of serum sodium level remains unchanged.  Sodium chloride infusion at 100 mL per hour, 1 day ordered

## 2023-05-04 NOTE — Hospital Course (Signed)
Ms. Andrea Griffin is an 89 year old female with history of hypertension, GERD, hyperlipidemia, who presents to the emergency department for chief concerns of abnormal labs and weakness.  Vitals in the ED showed temperature of 97.7, respiration rate of 18, heart rate 74, blood pressure 152/55, SpO2 of 95% on room air.  Serum Na of 114, K 4.0, chloride 77, bicarb 21, BUN of 18, serum creatinine of 0.64, EGFR greater than 60, nonfasting blood glucose 91, WBC 10.7, hemoglobin 13.4, platelets of 338.  Magnesium level is 1.8.  ED treatment: Sodium chloride 1 L bolus.

## 2023-05-04 NOTE — ED Notes (Signed)
Notified Dr Sedalia Muta of pt's sodium of 114 via secured chat

## 2023-05-04 NOTE — ED Provider Notes (Addendum)
[facility] Provider Note    Event Date/Time   First MD Initiated Contact with Patient 05/04/23 1745     (approximate)   History   Chief Complaint: Abnormal Labs   HPI  Andrea Griffin is a 89 y.o. female with a past history of hypertension, GERD, seasonal allergies who comes to the ED complaining of dizziness and pronounced fatigue that is been gradually worsening for the past several days.  Saw her PCP today and outpatient labs showed a sodium of 120.  Patient denies vomiting or diarrhea.  No chest pain or abdominal pain or recent illness.  Typically does not eat breakfast and only drinks about 16 ounces of water daily.     Physical Exam   Triage Vital Signs: ED Triage Vitals [05/04/23 1725]  Encounter Vitals Group     BP (!) 152/55     Systolic BP Percentile      Diastolic BP Percentile      Pulse Rate 74     Resp 18     Temp 97.7 F (36.5 C)     Temp Source Oral     SpO2 95 %     Weight 151 lb (68.5 kg)     Height 5\' 1"  (1.549 m)     Head Circumference      Peak Flow      Pain Score 0     Pain Loc      Pain Education      Exclude from Growth Chart     Most recent vital signs: Vitals:   05/04/23 1725  BP: (!) 152/55  Pulse: 74  Resp: 18  Temp: 97.7 F (36.5 C)  SpO2: 95%    General: Awake, no distress.  CV:  Good peripheral perfusion.  Regular rate and rhythm Resp:  Normal effort.  Clear to auscultation bilaterally Abd:  No distention.  Soft nontender Other:  Dry oral mucosa.  No lower extremity edema.  Cranial nerves III through XII intact, no nystagmus.  Normal speech and language and orientation.  Moving all extremities   ED Results / Procedures / Treatments   Labs (all labs ordered are listed, but only abnormal results are displayed) Labs Reviewed  BASIC METABOLIC PANEL - Abnormal; Notable for the following components:      Result Value   Sodium 114 (*)    Chloride 77 (*)    CO2 21 (*)    Calcium 8.7 (*)    Anion  gap 16 (*)    All other components within normal limits  MAGNESIUM    Outpatient labs from today: CBC  [facility]09/10/2022 Component 05/04/2023 07/06/2022 06/19/2021 06/17/2020          WBC (White Blood Cell Count) 10.7 High    6.6 6.1 6.4 Load older lab results  RBC (Red Blood Cell Count) 3.93 Low    4.29 4.17 4.44 Load older lab results  Hemoglobin 13.4 14.2 13.9 14.6 Load older lab results  Hematocrit 35.9 42.0 40.7 44.0 Load older lab results  MCV (Mean Corpuscular Volume) 91.3 97.9 97.6 99.1 Load older lab results  MCH (Mean Corpuscular Hemoglobin) 34.1 High    33.1 High    33.3 High    32.9 High    Load older lab results  MCHC (Mean Corpuscular Hemoglobin Concentration) 37.3 High    33.8 34.2 33.2 Load older lab results  RDW-CV (Red Cell Distribution Width) 11.1 Low    12.0 12.2 12.2 Load older  lab results  MPV (Mean Platelet Volume) 8.2 Low    8.1 Low    8.2 Low    8.8 Low    Load older lab results  Immature Granulocyte Count 0.04 0.02 0.01 0.02 Load older lab results   CHEM PROFILE  [facility]09/10/2022 Component 05/04/2023 01/04/2023 07/06/2022 12/17/2021 06/19/2021 12/16/2020 06/17/2020             Glucose 95 85 86 87 83 83 89 Load older lab results  Sodium 120 Low Panic    135 Low    135 Low    134 Low    135 Low    136 136 Load older lab results  Potassium 3.8 4.0 4.2 4.2 4.3 4.2 4.7 Load older lab results  Chloride 79 Low    101 99 100 100 101 99 Load older lab results  Carbon Dioxide (CO2) 31.5 28.9 30.7 29.0 28.9 28.3 29.9 Load older lab results  Urea Nitrogen (BUN) 15 12 14 12 13 13 11  Load older lab results  Creatinine 0.5 Low    0.5 Low    0.5 Low    0.5 Low    0.5 Low    0.6 0.6 Load older lab results  Glomerular Filtration Rate (eGFR) 90 90 90 117 117 95 95 Load older lab results  Calcium 9.2 9.3 9.2 9.0 9.4 9.3 9.7 Load older lab results  AST 26 18 17 14 17 17 16  Load older lab results  ALT 20 15 15  17 18 15 16  Load older lab results  Alk Phos (alkaline Phosphatase) 57 57 64 63 68 63 61 Load older lab results  Albumin 4.1 4.2 4.3 4.1 4.2 4.2 4.3 Load older lab results  Bilirubin, Total 0.9 0.8 0.7 0.7 0.7 0.8 0.6 Load older lab results  Protein, Total 6.4 6.7 6.7 6.6 6.9 6.6 7.0 Load older lab results  A/G Ratio 1.8 1.7 1.8 1.6 1.6 1.8 1.6 Load older lab results    EKG Interpreted by me Sinus rhythm rate of 73.  Left axis, first-degree AV block.  Poor R wave progression.  Normal ST segments and T waves   RADIOLOGY    PROCEDURES:  .Critical Care  Performed by: Sharman Cheek, MD Authorized by: Sharman Cheek, MD   Critical care provider statement:    Critical care time (minutes):  35   Critical care time was exclusive of:  Separately billable procedures and treating other patients   Critical care was necessary to treat or prevent imminent or life-threatening deterioration of the following conditions:  Dehydration and metabolic crisis   Critical care was time spent personally by me on the following activities:  Development of treatment plan with patient or surrogate, discussions with consultants, evaluation of patient's response to treatment, examination of patient, obtaining history from patient or surrogate, ordering and performing treatments and interventions, ordering and review of laboratory studies, ordering and review of radiographic studies, pulse oximetry, re-evaluation of patient's condition and review of old charts   Care discussed with: admitting provider      MEDICATIONS ORDERED IN ED: Medications  sodium chloride 0.9 % bolus 1,000 mL (1,000 mLs Intravenous New Bag/Given 05/04/23 1807)     IMPRESSION / MDM / ASSESSMENT AND PLAN / ED COURSE  I reviewed the triage vital signs and the nursing notes.  DDx: Symptomatic hyponatremia, hypomagnesemia  Patient's presentation is most consistent with acute presentation with potential threat to life or bodily  function.  Patient presents with labs already completed  from primary care which I reviewed.  Significant for pronounced hyponatremia.  Mental status is normal, neuroexam is nonfocal, no evidence of seizure or cardiovascular instability.  Will give saline bolus, plan to hospitalize for further eval.   ----------------------------------------- 6:48 PM on 05/04/2023 ----------------------------------------- Sodium level in ED is 114.  Not having severe symptoms, but requires hospitalization for further eval.  Continue IV saline.  ----------------------------------------- 7:21 PM on 05/04/2023 ----------------------------------------- Case discussed with hospitalist      FINAL CLINICAL IMPRESSION(S) / ED DIAGNOSES   Final diagnoses:  Hyponatremia     Rx / DC Orders   ED Discharge Orders     None        Note:  This document was prepared using Dragon voice recognition software and may include unintentional dictation errors.   Sharman Cheek, MD 05/04/23 Julio Sicks    Sharman Cheek, MD 05/04/23 Avon Gully    Sharman Cheek, MD 05/04/23 1921

## 2023-05-04 NOTE — ED Triage Notes (Signed)
Patient to ED via POV for abnormal labs- sodium of 120. States she was at PCP today for a routine visit. States she is feeling more tired than normal.

## 2023-05-04 NOTE — ED Notes (Signed)
Granddaughter of the pt left for the night.  The pt and the husband,  are both mildly confused.  This RN discussed if it was okay for the pt's husband to stay with the pt and she said that she thought it would be best if they stayed together, that though both of them are confused, they do listen do direction and do well with what they're told to do. Granddaughter's number is in the chart and she stated that she can be reached for assistance if needed.

## 2023-05-04 NOTE — Assessment & Plan Note (Signed)
Etiology workup in progress, check a UA, Legionella urine antigen, portable chest x-ray

## 2023-05-04 NOTE — Progress Notes (Signed)
[facility] Progress Note  Port CXR resulted as cardiac enlargement, bilateral pulmonary infiltrates suggesting multifocal pneumonia or edema.  Multifocal pneumonia -Azithromycin 500 mg IV daily, 5 days ordered: Ceftriaxone 2 g IV daily, 5 days ordered -DuoNebs every 6 hours as needed for wheezing and shortness of breath, 3 days ordered -Check lactic acid -Discussed with nursing: Regarding lactic acid, urine Legionella  Dr. Sedalia Muta

## 2023-05-04 NOTE — Assessment & Plan Note (Signed)
Hydralazine 5 mg IV every 8 hours as needed for SBP 175, 4 days ordered

## 2023-05-04 NOTE — H&P (Signed)
History and Physical   Andrea Griffin YNW:[id number] DOB: Aug 20, 1932 DOA: 05/04/2023  PCP: Marisue Ivan, MD  Patient coming from: Home  I have personally briefly reviewed patient's old medical records in [facility] EMR.  Chief Concern: Weakness, abnormal labs  HPI: Andrea Griffin is an 89 year old female with history of hypertension, GERD, hyperlipidemia, who presents to the emergency department for chief concerns of abnormal labs and weakness.  Vitals in the ED showed temperature of 97.7, respiration rate of 18, heart rate 74, blood pressure 152/55, SpO2 of 95% on room air.  Serum Na of 114, K 4.0, chloride 77, bicarb 21, BUN of 18, serum creatinine of 0.64, EGFR greater than 60, nonfasting blood glucose 91, WBC 10.7, hemoglobin 13.4, platelets of 338.  Magnesium level is 1.8.  ED treatment: Sodium chloride 1 L bolus. -------------------------- At bedside, patient was able to tell me her name, age, location, current calendar year.  She reports that her diet has not been very good the last couple of days.  She denies shortness of breath, chest pain, cough, abdominal pain, dysuria, hematuria, diarrhea, fever, chills, known sick contacts.  She reports that at baseline she does not consume a lot of salt.  She denies trauma to her person.  She endorses unintentional weight loss of about 20 something pounds in the last 6 months.  She reports that she listens to her body and that when she feels full she stops eating.  She denies personal history of cancer and known family history of cancer.  Social history: Patient lives at home with her husband of 65 years.  She denies tobacco, EtOH, recreational drug use.  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  ROS: Constitutional: no weight change, no fever ENT/Mouth: no sore throat, no rhinorrhea Eyes: no eye pain, no vision changes Cardiovascular: no chest pain, no dyspnea,  no edema, no  palpitations Respiratory: no cough, no sputum, no wheezing Gastrointestinal: no nausea, no vomiting, no diarrhea, no constipation Genitourinary: no urinary incontinence, no dysuria, no hematuria Musculoskeletal: no arthralgias, no myalgias Skin: no skin lesions, no pruritus, Neuro: + weakness, no loss of consciousness, no syncope Psych: no anxiety, no depression, + decrease appetite Heme/Lymph: no bruising, no bleeding  ED Course: Discussed with emergency medicine provider, patient requiring hospitalization for chief concerns of severe hyponatremia.  Assessment/Plan  Principal Problem:   Hyponatremia Active Problems:   Leukocytosis   DNR (do not resuscitate)   Essential hypertension   GERD (gastroesophageal reflux disease)   Hyperthyroidism   Assessment and Plan:  * Hyponatremia Severe hyponatremia with mild symptoms Etiology workup in progress Patient is status post sodium chloride 1 L bolus per EDP Serum sodium monitoring every 2 hours, 3 times ordered on admission Goal increase is 6-8 in 24 hours Check serum osmolality, urine osmolality, urine sodium, UA with complete microscopic, Legionella pneumophelia in urine antigen Nephrology specialist has been consulted via staff message and Epic order Admit to stepdown, inpatient  Addendum: First repeat of serum sodium level remains unchanged.  Sodium chloride infusion at 100 mL per hour, 1 day ordered  Hyperthyroidism Methimazole 2.5 mg p.o. resumed  Essential hypertension Hydralazine 5 mg IV every 8 hours as needed for SBP > 175, 4 days ordered  DNR (do not resuscitate) Confirmed with patient at bedside  Leukocytosis Etiology workup in progress, check a UA, Legionella urine antigen, portable chest x-ray  AM team to complete med reconciliation  Chart reviewed.   DVT prophylaxis: Enoxaparin  40 mg subcutaneous Code Status: DNR/DNI Diet: Regular diet Family Communication: Updated spouse at bedside with patient's  permission Disposition Plan: Pending clinical course Consults called: Neurology service Admission status: Inpatient, stepdown  Past Medical History:  Diagnosis Date   Anxiety    Arthritis    Asthma    Cancer (HCC)    melanoma skin cancer on head   GERD (gastroesophageal reflux disease)    History of recurrent cystitis    Hyperlipidemia    Hypertension    Infection of prosthetic hip joint (HCC)    Nocturia    Obesity    Osteopenia    Thyroid disease    Urinary frequency    Urinary incontinence    Urinary urgency    Wears glasses    Past Surgical History:  Procedure Laterality Date   ABDOMINAL HYSTERECTOMY     BACK SURGERY  01/21/2016   lumbar fusion   bladder stem dilation     BREAST BIOPSY Right    x2   BREAST BIOPSY Right    x2   CHOLECYSTECTOMY     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 Rosita Kea   INCISION AND DRAINAGE HIP Right 09/15/2012   INCISION AND DRAINAGE HIP Right 2013   Dr. Rosita Kea   LUMBAR FUSION  01/21/2016   L4-L5 by Dr. Danielle Dess   MELANOMA EXCISION     skin cancer on top of head   OPEN REDUCTION INTERNAL FIXATION (ORIF) DISTAL RADIAL FRACTURE Right 10/05/2019   Procedure: OPEN REDUCTION INTERNAL FIXATION (ORIF) DISTAL RADIAL FRACTURE;  Surgeon: Kennedy Bucker, MD;  Location: [facility] ORS;  Service: Orthopedics;  Laterality: Right;   Social History:  reports that she has never smoked. She has never used smokeless tobacco. She reports that she does not drink alcohol and does not use drugs.  Allergies  Allergen Reactions   Ciprofloxacin Hives   Adhesive [Tape]     Pulls off skin   Nitrofurantoin Other (See Comments)    Can not remember reaction   Phenazopyridine Other (See Comments)    Pt doesn't remember   Ranitidine Other (See Comments)    Can not remember reaction   Sulfa Antibiotics Other (See Comments)    Can not remember reaction   Family  History  Problem Relation Age of Onset   Stroke Mother    Family history: Family history reviewed and not pertinent.  Prior to Admission medications   Medication Sig Start Date End Date Taking? Authorizing Provider  acetaminophen (TYLENOL) 500 MG tablet Take 1,000 mg by mouth 2 (two) times daily.   Yes [provider]  amoxicillin (AMOXIL) 500 MG capsule Take 500 mg by mouth 2 (two) times daily.   Yes [provider]  methimazole (TAPAZOLE) 5 MG tablet Take 2.5 mg by mouth daily. 03/25/23  Yes [provider]  amLODipine (NORVASC) 10 MG tablet Take 10 mg by mouth daily.    [provider]  atorvastatin (LIPITOR) 20 MG tablet Take 20 mg by mouth daily.    [provider]  Calcium Carb-Cholecalciferol (CALCIUM 600+D) 600-800 MG-UNIT TABS Take 2 tablets by mouth daily.    [provider]  fexofenadine (ALLEGRA) 180 MG tablet Take 180 mg by mouth daily.    [provider]  fluticasone (FLONASE) 50 MCG/ACT nasal spray Place 2 sprays into both nostrils at bedtime.     [provider]  Fluticasone-Salmeterol (ADVAIR)  100-50 MCG/DOSE AEPB Inhale 1 puff into the lungs 2 (two) times daily.    [provider]  Lidocaine (BLUE-EMU PAIN RELIEF DRY EX) Apply 1 application topically daily as needed (pain).    [provider]  losartan (COZAAR) 100 MG tablet Take 100 mg by mouth daily.    [provider]  omeprazole (PRILOSEC) 20 MG capsule Take 20 mg by mouth 2 (two) times daily.     [provider]  senna-docusate (SENOKOT-S) 8.6-50 MG tablet Take 2 tablets by mouth daily.    [provider]   Physical Exam: Vitals:   05/04/23 2200 05/04/23 2230 05/04/23 2300 05/04/23 2330  BP: 120/74 136/61 (!) 136/57 (!) 131/54  Pulse: 69 69 66 66  Resp: 15 16 18 20   Temp:      TempSrc:      SpO2: 97% 97% 96% 95%  Weight:      Height:       Constitutional: appears age-appropriate, frail, NAD,  calm Eyes: PERRL, lids and conjunctivae normal ENMT: Mucous membranes are moist. Posterior pharynx clear of any exudate or lesions. Age-appropriate dentition. Hearing appropriate Neck: normal, supple, no masses, no thyromegaly Respiratory: clear to auscultation bilaterally, no wheezing, no crackles. Normal respiratory effort. No accessory muscle use.  Cardiovascular: Regular rate and rhythm, no murmurs / rubs / gallops. No extremity edema. 2+ pedal pulses. No carotid bruits.  Abdomen: no tenderness, no masses palpated, no hepatosplenomegaly. Bowel sounds positive.  Musculoskeletal: no clubbing / cyanosis. No joint deformity upper and lower extremities. Good ROM, no contractures, no atrophy. Normal muscle tone.  Skin: no rashes, lesions, ulcers. No induration Neurologic: Sensation intact. Strength 5/5 in all 4.  Psychiatric: Normal judgment and insight. Alert and oriented x 3. Normal mood.   EKG: independently reviewed, showing sinus rhythm with rate of 73, QTc 420, first-degree AV block  Chest x-ray on Admission: I personally reviewed and I agree with radiologist reading as below.  DG Chest Port 1 View  Result Date: 05/04/2023 CLINICAL DATA:  Dyspnea on exertion. EXAM: PORTABLE CHEST 1 VIEW COMPARISON:  09/14/2012 FINDINGS: Cardiac enlargement. Perihilar and basilar infiltration suggesting multifocal pneumonia or edema. No pleural effusions. No pneumothorax. Mediastinal contours appear intact. Prominent soft tissue in the thoracic inlet displaces the trachea slightly to the right, likely representing thyroid goiter. No change. IMPRESSION: Cardiac enlargement. Bilateral pulmonary infiltrates suggesting multifocal pneumonia or edema. Electronically Signed   By: Burman Nieves M.D.   On: 05/04/2023 21:48    Labs on Admission: I have personally reviewed following labs  CBC from outpatient facility reviewed:  WBC 10.7, hemoglobin 13.4, hematocrit 35.9, neutrophils % 74.4, platelets of  338.  Basic Metabolic Panel: Recent Labs  Lab 05/04/23 1808 05/04/23 2114  NA 114* 114*  K 4.0  --   CL 77*  --   CO2 21*  --   GLUCOSE 91  --   BUN 18  --   CREATININE 0.64  --   CALCIUM 8.7*  --   MG 1.8  --    GFR: Estimated Creatinine Clearance: 42.2 mL/min (by C-G formula based on SCr of 0.64 mg/dL).  Urine analysis:    Component Value Date/Time   COLORURINE Yellow 03/13/2014 1403   APPEARANCEUR Clear 03/13/2014 1403   LABSPEC 1.030 03/13/2014 1403   PHURINE 5.0 03/13/2014 1403   GLUCOSEU Negative 03/13/2014 1403   HGBUR Negative 03/13/2014 1403   BILIRUBINUR Negative 03/13/2014 1403   KETONESUR Negative 03/13/2014 1403   PROTEINUR Negative 03/13/2014  1403   NITRITE Negative 03/13/2014 1403   LEUKOCYTESUR Negative 03/13/2014 1403   This document was prepared using Dragon Voice Recognition software and may include unintentional dictation errors.  Dr. Sedalia Muta [facility]  If 7PM-7AM, please contact overnight-coverage provider If 7AM-7PM, please contact day attending provider [email or web address]  05/05/2023, 12:28 AM

## 2023-05-04 NOTE — ED Notes (Signed)
Pt ambulated to bathroom with standby assistance. MD notified of Na 114

## 2023-05-05 ENCOUNTER — Inpatient Hospital Stay
Admit: 2023-05-05 | Discharge: 2023-05-05 | Disposition: A | Discharge status: Home / Self Care | Payer: Medicare PPO | Attending: Internal Medicine | Admitting: Internal Medicine

## 2023-05-05 ENCOUNTER — Encounter: Payer: Self-pay | Admitting: Internal Medicine

## 2023-05-05 DIAGNOSIS — I10 Essential (primary) hypertension: Secondary | ICD-10-CM | POA: Diagnosis not present

## 2023-05-05 DIAGNOSIS — E876 Hypokalemia: Secondary | ICD-10-CM

## 2023-05-05 DIAGNOSIS — Z66 Do not resuscitate: Secondary | ICD-10-CM | POA: Diagnosis not present

## 2023-05-05 DIAGNOSIS — E871 Hypo-osmolality and hyponatremia: Secondary | ICD-10-CM | POA: Diagnosis not present

## 2023-05-05 DIAGNOSIS — E0590 Thyrotoxicosis, unspecified without thyrotoxic crisis or storm: Secondary | ICD-10-CM | POA: Diagnosis not present

## 2023-05-05 LAB — BASIC METABOLIC PANEL WITH GFR
Anion gap: 11 (ref 5–15)
Anion gap: 10 (ref 5–15)
BUN: 11 mg/dL (ref 8–23)
BUN: 11 mg/dL (ref 8–23)
CO2: 25 mmol/L (ref 22–32)
CO2: 26 mmol/L (ref 22–32)
Calcium: 8.3 mg/dL — ABNORMAL LOW (ref 8.9–10.3)
Calcium: 8.2 mg/dL — ABNORMAL LOW (ref 8.9–10.3)
Chloride: 86 mmol/L — ABNORMAL LOW (ref 98–111)
Chloride: 86 mmol/L — ABNORMAL LOW (ref 98–111)
Creatinine, Ser: 0.38 mg/dL — ABNORMAL LOW (ref 0.44–1.00)
Creatinine, Ser: 0.41 mg/dL — ABNORMAL LOW (ref 0.44–1.00)
GFR, Estimated: >60 mL/min
GFR, Estimated: >60 mL/min
Glucose, Bld: 88 mg/dL (ref 70–99)
Glucose, Bld: 125 mg/dL — ABNORMAL HIGH (ref 70–99)
Potassium: 3.2 mmol/L — ABNORMAL LOW (ref 3.5–5.1)
Potassium: 2.8 mmol/L — ABNORMAL LOW (ref 3.5–5.1)
Sodium: 122 mmol/L — ABNORMAL LOW (ref 135–145)
Sodium: 122 mmol/L — ABNORMAL LOW (ref 135–145)

## 2023-05-05 LAB — URINALYSIS, COMPLETE (UACMP) WITH MICROSCOPIC
Bilirubin Urine: NEGATIVE
Glucose, UA: NEGATIVE mg/dL
Hgb urine dipstick: NEGATIVE
Ketones, ur: NEGATIVE mg/dL
Nitrite: NEGATIVE
Protein, ur: NEGATIVE mg/dL
Specific Gravity, Urine: 1.003 — ABNORMAL LOW (ref 1.005–1.030)
pH: 7 (ref 5.0–8.0)

## 2023-05-05 LAB — MRSA NEXT GEN BY PCR, NASAL: MRSA by PCR Next Gen: NOT DETECTED

## 2023-05-05 LAB — OSMOLALITY, URINE: Osmolality, Ur: 162 mosm/kg — ABNORMAL LOW (ref 300–900)

## 2023-05-05 LAB — LACTIC ACID, PLASMA: Lactic Acid, Venous: 0.9 mmol/L (ref 0.5–1.9)

## 2023-05-05 LAB — CORTISOL: Cortisol, Plasma: 4.4 ug/dL

## 2023-05-05 LAB — CBC
HCT: 35.5 % — ABNORMAL LOW (ref 36.0–46.0)
Hemoglobin: 12.7 g/dL (ref 12.0–15.0)
MCH: 33.3 pg (ref 26.0–34.0)
MCHC: 35.8 g/dL (ref 30.0–36.0)
MCV: 93.2 fL (ref 80.0–100.0)
Platelets: 336 K/uL (ref 150–400)
RBC: 3.81 MIL/uL — ABNORMAL LOW (ref 3.87–5.11)
RDW: 11.2 % — ABNORMAL LOW (ref 11.5–15.5)
WBC: 10.5 K/uL (ref 4.0–10.5)
nRBC: 0 % (ref 0.0–0.2)

## 2023-05-05 LAB — GLUCOSE, CAPILLARY: Glucose-Capillary: 118 mg/dL — ABNORMAL HIGH (ref 70–99)

## 2023-05-05 LAB — SODIUM, URINE, RANDOM: Sodium, Ur: 26 mmol/L

## 2023-05-05 LAB — MAGNESIUM: Magnesium: 1.7 mg/dL (ref 1.7–2.4)

## 2023-05-05 LAB — TSH: TSH: 0.711 u[IU]/mL (ref 0.350–4.500)

## 2023-05-05 LAB — SODIUM: Sodium: 118 mmol/L — CL (ref 135–145)

## 2023-05-05 MED ORDER — POTASSIUM CHLORIDE 20 MEQ PO PACK
40.0000 meq | PACK | Freq: Once | ORAL | Status: AC
  Administered 2023-05-06: 40 meq via ORAL
  Filled 2023-05-05: qty 2

## 2023-05-05 MED ORDER — POTASSIUM CHLORIDE 20 MEQ PO PACK
40.0000 meq | PACK | Freq: Once | ORAL | Status: AC
  Administered 2023-05-05: 40 meq via ORAL
  Filled 2023-05-05: qty 2

## 2023-05-05 MED ORDER — METHIMAZOLE 2.5 MG HALF TABLET
2.5000 mg | ORAL_TABLET | Freq: Every day | ORAL | Status: DC
  Administered 2023-05-05 – 2023-05-07 (×3): 2.5 mg via ORAL
  Filled 2023-05-05 (×4): qty 1

## 2023-05-05 MED ORDER — ENSURE ENLIVE PO LIQD
237.0000 mL | Freq: Two times a day (BID) | ORAL | Status: DC
  Administered 2023-05-06 – 2023-05-07 (×4): 237 mL via ORAL

## 2023-05-05 MED ORDER — CHLORHEXIDINE GLUCONATE CLOTH 2 % EX PADS
6.0000 | MEDICATED_PAD | Freq: Every day | CUTANEOUS | Status: DC
  Administered 2023-05-05 – 2023-05-07 (×3): 6 via TOPICAL

## 2023-05-05 MED ORDER — AZITHROMYCIN 250 MG PO TABS
500.0000 mg | ORAL_TABLET | Freq: Every day | ORAL | Status: DC
  Administered 2023-05-05 – 2023-05-06 (×2): 500 mg via ORAL
  Filled 2023-05-05: qty 2

## 2023-05-05 MED ORDER — AMLODIPINE BESYLATE 10 MG PO TABS
10.0000 mg | ORAL_TABLET | Freq: Every day | ORAL | Status: DC
  Administered 2023-05-05 – 2023-05-07 (×3): 10 mg via ORAL
  Filled 2023-05-05: qty 2
  Filled 2023-05-05 (×2): qty 1

## 2023-05-05 MED ORDER — IPRATROPIUM-ALBUTEROL 0.5-2.5 (3) MG/3ML IN SOLN: 3.0000 mL | Freq: Four times a day (QID) | RESPIRATORY_TRACT | Status: DC | PRN

## 2023-05-05 NOTE — ED Notes (Signed)
Lab contacted to obtain labs

## 2023-05-05 NOTE — ED Notes (Signed)
Pt ambulatory  to [facility] with assistance. Pt back in bed at this time after using BSC. Pt denies any further needs at this time. Call light within reach.

## 2023-05-05 NOTE — ED Notes (Signed)
RN to bedside to answer call light. Pt concerned about "beeping", RN educated pt. Pt sitting in chair drinking coffee at this time. Pt denies any needs at this time.

## 2023-05-05 NOTE — Assessment & Plan Note (Signed)
Methimazole 2.5 mg p.o. resumed

## 2023-05-05 NOTE — ED Notes (Signed)
Pt ambulated to [facility] with assistance. Pt sat up in chair for lunch.

## 2023-05-05 NOTE — ED Notes (Signed)
Pt called out for the bathroom at this time. Pt assisted by this RN. Pt gait steady. Bedside commode used. Pt voided .

## 2023-05-05 NOTE — Consult Note (Signed)
[facility] Consult Note: 05/05/23    Date of Admission:  05/04/2023           Reason for Consult:  Hyponatremia   Referring Provider: Marcelino Duster, MD Primary Care Provider: Marisue Ivan, MD   History of Presenting Illness:  Andrea Griffin is a 89 y.o. female with medical problems of hypertension, GERD, hyperlipidemia presented to the emergency room for abnormal labs. She was feeling more tired than usual and was confused.  Labs at PCP office showed sodium of 120.  Patient was admitted for further evaluation and management. She was given IV fluids in the form of normal saline.  Sodium has improved today to 122. Today patient is sitting up in the chair and feels like she is back to her usual mental status. She reports that her voice has changed to hoarse over the past few weeks.  She has lost 20 to 25 pounds over the last few months.  She feels like she is eating less but the weight loss is disproportionate. No leg edema.   Review of Systems: Review of Systems  Constitutional:  Positive for malaise/fatigue and weight loss. Negative for chills and fever.  HENT:  Negative for hearing loss and sinus pain.   Eyes:  Negative for blurred vision and double vision.  Respiratory:  Negative for cough, hemoptysis, sputum production and shortness of breath.   Cardiovascular:  Negative for chest pain, palpitations and leg swelling.  Gastrointestinal:  Negative for constipation, diarrhea, heartburn, nausea and vomiting.  Genitourinary:  Negative for dysuria, flank pain, frequency, hematuria and urgency.  Musculoskeletal:  Negative for joint pain, myalgias and neck pain.  Skin:  Negative for rash.  Neurological:  Positive for speech change. Negative for dizziness and headaches.  Endo/Heme/Allergies:  Does not bruise/bleed easily.  Psychiatric/Behavioral:  Negative for depression and substance abuse.     Past Medical History:  Diagnosis Date   Anxiety     Arthritis    Asthma    Cancer (HCC)    melanoma skin cancer on head   GERD (gastroesophageal reflux disease)    History of recurrent cystitis    Hyperlipidemia    Hypertension    Infection of prosthetic hip joint (HCC)    Nocturia    Obesity    Osteopenia    Thyroid disease    Urinary frequency    Urinary incontinence    Urinary urgency    Wears glasses     Social History   Tobacco Use   Smoking status: Never   Smokeless tobacco: Never  Substance Use Topics   Alcohol use: No   Drug use: No    Family History  Problem Relation Age of Onset   Stroke Mother      OBJECTIVE: Blood pressure (!) 112/98, pulse 72, temperature (!) 97.5 F (36.4 C), temperature source Axillary, resp. rate 14, height 5\' 1"  (1.549 m), weight 68.5 kg, SpO2 96%.  Physical Exam General Appearance: Elderly frail woman, sitting in the chair HEENT: Moist oral mucous membranes Pulmonary: Normal breathing effort, decreased breath sounds at bases Cardiovascular: Soft systolic murmur, regular rhythm Abdomen: Soft, nontender, nondistended Extremities: No peripheral edema Skin: No acute rashes Neuro: Alert, oriented   Lab Results Lab Results  Component Value Date   WBC 10.5 05/05/2023   HGB 12.7 05/05/2023   HCT 35.5 (L) 05/05/2023   MCV 93.2 05/05/2023   PLT 336 05/05/2023    Lab Results  Component Value Date   CREATININE 0.38 (L)  05/05/2023   BUN 11 05/05/2023   NA 122 (L) 05/05/2023   K 3.2 (L) 05/05/2023   CL 86 (L) 05/05/2023   CO2 25 05/05/2023    Lab Results  Component Value Date   ALT 11 (L) 10/16/2012   AST 13 (L) 10/16/2012   ALKPHOS 120 10/16/2012   BILITOT 0.6 10/16/2012     Microbiology: No results found for this or any previous visit (from the past 240 hour(s)).  Medications: Scheduled Meds:  amLODipine  10 mg Oral Daily   azithromycin  500 mg Oral QHS   enoxaparin (LOVENOX) injection  40 mg Subcutaneous Q24H   feeding supplement  237 mL Oral BID BM    methimazole  2.5 mg Oral Daily   Continuous Infusions:  cefTRIAXone (ROCEPHIN)  IV Stopped (05/04/23 2244)   PRN Meds:.acetaminophen **OR** acetaminophen, hydrALAZINE, ipratropium-albuterol, ondansetron **OR** ondansetron (ZOFRAN) IV, senna-docusate  Allergies  Allergen Reactions   Ciprofloxacin Hives   Adhesive [Tape]     Pulls off skin   Nitrofurantoin Other (See Comments)    Can not remember reaction   Phenazopyridine Other (See Comments)    Pt doesn't remember   Ranitidine Other (See Comments)    Can not remember reaction   Sulfa Antibiotics Other (See Comments)    Can not remember reaction    Urinalysis: Recent Labs    05/05/23 0057  COLORURINE STRAW*  LABSPEC 1.003*  PHURINE 7.0  GLUCOSEU NEGATIVE  HGBUR NEGATIVE  BILIRUBINUR NEGATIVE  KETONESUR NEGATIVE  PROTEINUR NEGATIVE  NITRITE NEGATIVE  LEUKOCYTESUR MODERATE*      Imaging: DG Chest Port 1 View  Result Date: 05/04/2023 CLINICAL DATA:  Dyspnea on exertion. EXAM: PORTABLE CHEST 1 VIEW COMPARISON:  09/14/2012 FINDINGS: Cardiac enlargement. Perihilar and basilar infiltration suggesting multifocal pneumonia or edema. No pleural effusions. No pneumothorax. Mediastinal contours appear intact. Prominent soft tissue in the thoracic inlet displaces the trachea slightly to the right, likely representing thyroid goiter. No change. IMPRESSION: Cardiac enlargement. Bilateral pulmonary infiltrates suggesting multifocal pneumonia or edema. Electronically Signed   By: Burman Nieves M.D.   On: 05/04/2023 21:48      Assessment/Plan:  Andrea Griffin is a 89 y.o. female with medical problems of   hypertension, GERD, hyperlipidemia   was admitted on 05/04/2023 for :  Hyponatremia [E87.1]  Hyponatremia Differential diagnosis include excessive fluid intake and poor protein/calories intake.  Patient is not on diuretics. Urine output indicates low specific gravity suggesting adequate free water clearance.  Chest x-ray showed  cardiac enlargement, bilateral pulmonary infiltrates suggesting multifocal pneumonia or edema.  Plan- -May discontinue IV fluid supplementation as oral intake is now adequate. -Ensure (chocolate) protein and calorie supplements -Consider 2D echo to evaluate for congestive heart failure. -Consider CT noncontrast to evaluate for  underlying lung disease vs malignancy -Kappa lambda ratio and SPEP tomorrow morning. -Repeat TSH as patient has history of abnormal TSH in January 2014. May need thyroid ultrasound.    Alycen Mack Thedore Mins 05/05/23

## 2023-05-05 NOTE — Progress Notes (Signed)
Progress Note   Patient: Andrea Griffin ZOX:[id number] DOB: 11-28-1931 DOA: 05/04/2023     1 DOS: the patient was seen and examined on 05/05/2023   Brief hospital course: Andrea Griffin is an 89 year old female with history of hypertension, GERD, hyperlipidemia, who presents to the emergency department for chief concerns of abnormal labs and weakness.   Assessment and Plan: * Hyponatremia Seen by nephrology, possible excessive fluid intake. DC IV fluids for now. CT chest non contrast study will be ordered. Echo to rule out CHF, she has cardiomegaly, possible edema on chest xray. Sodium rechecked 122 stable. Further work up per nephrology team. Continue to monitor sodium closely. Will downgrade her to progressive if sodium remains stable.  Hypokalemia-  Continue supplements. Monitor daily renal function.  Hyperthyroidism Methimazole 2.5 mg p.o. resumed  Essential hypertension Hydralazine 5 mg IV every 8 hours as needed for SBP > 175, 4 days ordered.  Hyperlipidemia- On statin therapy.  DNR (do not resuscitate) Confirmed with patient at bedside      Subjective: Patient is seen and examined today morning. She is lying in bed, states she came for abnormal labs. Has some weakness, but no other complaints.  Physical Exam: Vitals:   05/05/23 1000 05/05/23 1200 05/05/23 1342 05/05/23 1400  BP: 122/89 (!) 119/50  (!) 112/98  Pulse: 71 69  72  Resp: 20 17  14   Temp:   (!) 97.5 F (36.4 C)   TempSrc:   Axillary   SpO2: 96% 95%  96%  Weight:      Height:       General -Elderly Caucasian ill female, no apparent distress HEENT - PERRLA, EOMI, atraumatic head, non tender sinuses. Lung -bibasilar Rales Heart - S1, S2 heard, no murmurs, rubs, 1+ pedal edema Neuro - Alert, awake and oriented x 3, non focal exam. Skin - Warm and dry.   Data Reviewed:     Latest Ref Rng & Units 05/05/2023    5:34 AM 10/05/2019    6:23 AM 10/03/2019    8:57 AM  CBC  WBC 4.0 - 10.5 K/uL 10.5    8.0   Hemoglobin 12.0 - 15.0 g/dL 81.1  91.4  78.2   Hematocrit 36.0 - 46.0 % 35.5  37.0  37.7   Platelets 150 - 400 K/uL 336   353       Latest Ref Rng & Units 05/05/2023    3:13 PM 05/05/2023    5:34 AM 05/05/2023   12:57 AM  BMP  Glucose 70 - 99 mg/dL 956  88    BUN 8 - 23 mg/dL 11  11    Creatinine 2.13 - 1.00 mg/dL 0.86  5.78    Sodium 469 - 145 mmol/L 122  122  118   Potassium 3.5 - 5.1 mmol/L 2.8  3.2    Chloride 98 - 111 mmol/L 86  86    CO2 22 - 32 mmol/L 26  25    Calcium 8.9 - 10.3 mg/dL 8.2  8.3     DG Chest Port 1 View  Result Date: 05/04/2023 CLINICAL DATA:  Dyspnea on exertion. EXAM: PORTABLE CHEST 1 VIEW COMPARISON:  09/14/2012 FINDINGS: Cardiac enlargement. Perihilar and basilar infiltration suggesting multifocal pneumonia or edema. No pleural effusions. No pneumothorax. Mediastinal contours appear intact. Prominent soft tissue in the thoracic inlet displaces the trachea slightly to the right, likely representing thyroid goiter. No change. IMPRESSION: Cardiac enlargement. Bilateral pulmonary infiltrates suggesting multifocal pneumonia or edema. Electronically Signed   By:  Burman Nieves M.D.   On: 05/04/2023 21:48     Family Communication: Patient understands current care plan. Will try to call husband to update.  Disposition: Status is: Inpatient Remains inpatient appropriate because: hyponatremia correction.  Planned Discharge Destination: Home with Home Health    Time spent: 43 minutes  Author: Marcelino Duster, MD 05/05/2023 4:57 PM  For on call review [email or web address].

## 2023-05-05 NOTE — ED Notes (Signed)
Patient care taken, no complaints at this time ?

## 2023-05-05 NOTE — Progress Notes (Signed)
PHARMACIST - PHYSICIAN COMMUNICATION  CONCERNING: Antibiotic IV to Oral Route Change Policy  RECOMMENDATION: This patient is receiving azithromycin by the intravenous route.  Based on criteria approved by the Pharmacy and Therapeutics Committee, the antibiotic(s) is/are being converted to the equivalent oral dose form(s).  DESCRIPTION: These criteria include: Patient being treated for a respiratory tract infection, urinary tract infection, cellulitis or clostridium difficile associated diarrhea if on metronidazole The patient is not neutropenic and does not exhibit a GI malabsorption state The patient is eating (either orally or via tube) and/or has been taking other orally administered medications for a least 24 hours The patient is improving clinically and has a Tmax < 100.5  If you have questions about this conversion, please contact the Pharmacy Department   Tressie Ellis 05/05/23

## 2023-05-06 ENCOUNTER — Inpatient Hospital Stay: Payer: Medicare PPO

## 2023-05-06 DIAGNOSIS — E871 Hypo-osmolality and hyponatremia: Secondary | ICD-10-CM | POA: Diagnosis not present

## 2023-05-06 DIAGNOSIS — E0590 Thyrotoxicosis, unspecified without thyrotoxic crisis or storm: Secondary | ICD-10-CM | POA: Diagnosis not present

## 2023-05-06 DIAGNOSIS — Z66 Do not resuscitate: Secondary | ICD-10-CM | POA: Diagnosis not present

## 2023-05-06 DIAGNOSIS — I10 Essential (primary) hypertension: Secondary | ICD-10-CM | POA: Diagnosis not present

## 2023-05-06 LAB — BASIC METABOLIC PANEL
Anion gap: 8 (ref 5–15)
BUN: 10 mg/dL (ref 8–23)
CO2: 24 mmol/L (ref 22–32)
Calcium: 8.4 mg/dL — ABNORMAL LOW (ref 8.9–10.3)
Chloride: 93 mmol/L — ABNORMAL LOW (ref 98–111)
Creatinine, Ser: 0.34 mg/dL — ABNORMAL LOW (ref 0.44–1.00)
GFR, Estimated: >60 mL/min (ref >60)
Glucose, Bld: 86 mg/dL (ref 70–99)
Potassium: 4 mmol/L (ref 3.5–5.1)
Sodium: 125 mmol/L — ABNORMAL LOW (ref 135–145)

## 2023-05-06 LAB — ECHOCARDIOGRAM COMPLETE
AR max vel: 2.12 cm2
AV Area VTI: 1.71 cm2
AV Area mean vel: 1.97 cm2
AV Mean grad: 10.4 mmHg
AV Peak grad: 16.8 mmHg
Ao pk vel: 2.05 m/s
Area-P 1/2: 3.88 cm2
Height: 62 in
P 1/2 time: 484 ms
S' Lateral: 2.8 cm
Weight: 2543.23 [oz_av]

## 2023-05-06 LAB — LEGIONELLA PNEUMOPHILA SEROGP 1 UR AG: L. pneumophila Serogp 1 Ur Ag: NEGATIVE

## 2023-05-06 MED ORDER — PANTOPRAZOLE SODIUM 40 MG PO TBEC
40.0000 mg | DELAYED_RELEASE_TABLET | Freq: Two times a day (BID) | ORAL | Status: DC
  Administered 2023-05-06 – 2023-05-07 (×2): 40 mg via ORAL
  Filled 2023-05-06 (×2): qty 1

## 2023-05-06 MED ORDER — FLUTICASONE PROPIONATE 50 MCG/ACT NA SUSP
2.0000 | Freq: Every day | NASAL | Status: DC
  Administered 2023-05-06: 2 via NASAL
  Filled 2023-05-06: qty 16

## 2023-05-06 MED ORDER — MOMETASONE FURO-FORMOTEROL FUM 100-5 MCG/ACT IN AERO
2.0000 | INHALATION_SPRAY | Freq: Two times a day (BID) | RESPIRATORY_TRACT | Status: DC
  Administered 2023-05-06 – 2023-05-07 (×2): 2 via RESPIRATORY_TRACT
  Filled 2023-05-06: qty 8.8

## 2023-05-06 MED ORDER — ATORVASTATIN CALCIUM 20 MG PO TABS
20.0000 mg | ORAL_TABLET | Freq: Every day | ORAL | Status: DC
  Administered 2023-05-06 – 2023-05-07 (×2): 20 mg via ORAL
  Filled 2023-05-06 (×2): qty 1

## 2023-05-06 NOTE — Progress Notes (Signed)
[facility]  ROUNDING NOTE   Subjective:   Patient seen sitting up in bed Husband and daughter at bedside Competed breakfast tray  Sodium 125   Objective:  Vital signs in last 24 hours:  Temp:  [97.5 F (36.4 C)-98.2 F (36.8 C)] 98.2 F (36.8 C) (09/05 0810) Pulse Rate:  [66-89] 70 (09/05 0810) Resp:  [12-24] 18 (09/05 0810) BP: (112-155)/(45-131) 123/101 (09/05 0810) SpO2:  [93 %-100 %] 97 % (09/05 0810) Weight:  [72.1 kg] 72.1 kg (09/04 1651)  Weight change: 3.607 kg Filed Weights   05/04/23 1725 05/05/23 1651  Weight: 68.5 kg 72.1 kg    Intake/Output: I/O last 3 completed shifts: In: 206.7 [IV Piggyback:206.7] Out: -    Intake/Output this shift:  No intake/output data recorded.  Physical Exam: General: NAD  Head: Normocephalic, atraumatic. Moist oral mucosal membranes  Eyes: Anicteric  Lungs:  Clear to auscultation, normal effort  Heart: Regular rate and rhythm  Abdomen:  Soft, nontender  Extremities:  No peripheral edema.  Neurologic: Alert, moving all four extremities  Skin: No lesions       Basic Metabolic Panel: Recent Labs  Lab 05/04/23 1808 05/04/23 2114 05/05/23 0057 05/05/23 0534 05/05/23 1513 05/05/23 2038 05/06/23 0458  NA 114* 114* 118* 122* 122*  --  125*  K 4.0  --   --  3.2* 2.8*  --  4.0  CL 77*  --   --  86* 86*  --  93*  CO2 21*  --   --  25 26  --  24  GLUCOSE 91  --   --  88 125*  --  86  BUN 18  --   --  11 11  --  10  CREATININE 0.64  --   --  0.38* 0.41*  --  0.34*  CALCIUM 8.7*  --   --  8.3* 8.2*  --  8.4*  MG 1.8  --   --   --   --  1.7  --     Liver Function Tests: No results for input(s): "AST", "ALT", "ALKPHOS", "BILITOT", "PROT", "ALBUMIN" in the last 168 hours. No results for input(s): "LIPASE", "AMYLASE" in the last 168 hours. No results for input(s): "AMMONIA" in the last 168 hours.  CBC: Recent Labs  Lab 05/05/23 0534  WBC 10.5  HGB 12.7  HCT 35.5*  MCV 93.2  PLT 336    Cardiac  Enzymes: No results for input(s): "CKTOTAL", "CKMB", "CKMBINDEX", "TROPONINI" in the last 168 hours.  BNP: Invalid input(s): "POCBNP"  CBG: Recent Labs  Lab 05/05/23 1651  GLUCAP 118*    Microbiology: Results for orders placed or performed during the hospital encounter of 05/04/23  MRSA Next Gen by PCR, Nasal     Status: None   Collection Time: 05/05/23  4:51 PM   Specimen: Nasal Mucosa; Nasal Swab  Result Value Ref Range Status   MRSA by PCR Next Gen NOT DETECTED NOT DETECTED Final    Comment: (NOTE) The GeneXpert MRSA Assay (FDA approved for NASAL specimens only), is one component of a comprehensive MRSA colonization surveillance program. It is not intended to diagnose MRSA infection nor to guide or monitor treatment for MRSA infections. Test performance is not FDA approved in patients less than 39 years old. Performed at [facility], [address] [place], [place] [address]     Coagulation Studies: No results for input(s): "LABPROT", "INR" in the last 72 hours.  Urinalysis: Recent Labs  05/05/23 0057  COLORURINE STRAW*  LABSPEC 1.003*  PHURINE 7.0  GLUCOSEU NEGATIVE  HGBUR NEGATIVE  BILIRUBINUR NEGATIVE  KETONESUR NEGATIVE  PROTEINUR NEGATIVE  NITRITE NEGATIVE  LEUKOCYTESUR MODERATE*      Imaging: DG Chest Port 1 View  Result Date: 05/04/2023 CLINICAL DATA:  Dyspnea on exertion. EXAM: PORTABLE CHEST 1 VIEW COMPARISON:  09/14/2012 FINDINGS: Cardiac enlargement. Perihilar and basilar infiltration suggesting multifocal pneumonia or edema. No pleural effusions. No pneumothorax. Mediastinal contours appear intact. Prominent soft tissue in the thoracic inlet displaces the trachea slightly to the right, likely representing thyroid goiter. No change. IMPRESSION: Cardiac enlargement. Bilateral pulmonary infiltrates suggesting multifocal pneumonia or edema. Electronically Signed   By: Burman Nieves M.D.   On: 05/04/2023 21:48     Medications:     cefTRIAXone (ROCEPHIN)  IV Stopped (05/05/23 2147)    amLODipine  10 mg Oral Daily   azithromycin  500 mg Oral QHS   Chlorhexidine Gluconate Cloth  6 each Topical Daily   enoxaparin (LOVENOX) injection  40 mg Subcutaneous Q24H   feeding supplement  237 mL Oral BID BM   methimazole  2.5 mg Oral Daily   acetaminophen **OR** acetaminophen, hydrALAZINE, ipratropium-albuterol, ondansetron **OR** ondansetron (ZOFRAN) IV, senna-docusate  Assessment/ Plan:  Ms. Andrea Griffin is a 89 y.o.  female with medical problems of hypertension, GERD, hyperlipidemia presented to the emergency room for abnormal labs.    Hyponatremia Differential diagnosis include excessive fluid intake and poor protein/calories intake.  Patient is not on diuretics. Urine output indicates low specific gravity suggesting adequate free water clearance.  Chest x-ray showed cardiac enlargement, bilateral pulmonary infiltrates suggesting multifocal pneumonia or edema.   Plan- -Sodium improving with oral intake, 125 -Continue Ensure (chocolate) protein and calorie supplements -2D echo pending to evaluate for congestive heart failure. -Consider CT noncontrast to evaluate for  underlying lung disease vs malignancy -Kappa lambda ratio and SPEP pending -TSH 0.7      LOS: 2 Hammad Finkler 9/5/202411:03 AM

## 2023-05-06 NOTE — Progress Notes (Signed)
Progress Note   Patient: Andrea Griffin ZOX:[id number] DOB: [birth year] DOA: 05/04/2023     2 DOS: the patient was seen and examined on 05/06/2023   Brief hospital course: Cleva Parkison is an 89 year old female with history of hypertension, GERD, hyperlipidemia, who presents to the emergency department for chief concerns of abnormal labs and weakness.   Assessment and Plan: * Hyponatremia Seen by nephrology, possible excessive fluid intake. Sodium 125 today. CT chest non contrast study ordered. Echo done read pending rule out CHF, she has cardiomegaly, pulmonary edema. Further work up per nephrology team. Transfer to progressive as sodium improving.  Hypokalemia-  Continue supplements. Monitor daily renal function.  Hyperthyroidism Methimazole 2.5 mg p.o. resumed  Essential hypertension Hydralazine 5 mg IV every 8 hours as needed for SBP > 175, 4 days ordered.  Hyperlipidemia- On statin therapy.  PT evaluation ordered. She can be transferred ou to progressive unit.     Subjective: Patient is seen and examined today morning. She feels better, husband and grand daughter at bedside. She has been drinking to much water per family. Advised to get out of bed to chair.  Physical Exam: Vitals:   05/06/23 0300 05/06/23 0400 05/06/23 0810 05/06/23 1200  BP:  (!) 155/61 (!) 123/101 109/66  Pulse: 80 89 70 68  Resp: 17 16 18 20   Temp:  97.9 F (36.6 C) 98.2 F (36.8 C) 98 F (36.7 C)  TempSrc:  Oral Oral Oral  SpO2: 95% 97% 97% 95%  Weight:      Height:       General -Elderly Caucasian ill female, no apparent distress HEENT - PERRLA, EOMI, atraumatic head, non tender sinuses. Lung -bibasilar Rales Heart - S1, S2 heard, no murmurs, rubs, 1+ pedal edema Neuro - Alert, awake and oriented x 3, non focal exam. Skin - Warm and dry.   Data Reviewed:     Latest Ref Rng & Units 05/05/2023    5:34 AM 10/05/2019    6:23 AM 10/03/2019    8:57 AM  CBC  WBC 4.0 - 10.5 K/uL 10.5   8.0    Hemoglobin 12.0 - 15.0 g/dL 81.1  91.4  78.2   Hematocrit 36.0 - 46.0 % 35.5  37.0  37.7   Platelets 150 - 400 K/uL 336   353       Latest Ref Rng & Units 05/06/2023    4:58 AM 05/05/2023    3:13 PM 05/05/2023    5:34 AM  BMP  Glucose 70 - 99 mg/dL 86  956  88   BUN 8 - 23 mg/dL 10  11  11    Creatinine 0.44 - 1.00 mg/dL 2.13  0.86  5.78   Sodium 135 - 145 mmol/L 125  122  122   Potassium 3.5 - 5.1 mmol/L 4.0  2.8  3.2   Chloride 98 - 111 mmol/L 93  86  86   CO2 22 - 32 mmol/L 24  26  25    Calcium 8.9 - 10.3 mg/dL 8.4  8.2  8.3    DG Chest Port 1 View  Result Date: 05/04/2023 CLINICAL DATA:  Dyspnea on exertion. EXAM: PORTABLE CHEST 1 VIEW COMPARISON:  09/14/2012 FINDINGS: Cardiac enlargement. Perihilar and basilar infiltration suggesting multifocal pneumonia or edema. No pleural effusions. No pneumothorax. Mediastinal contours appear intact. Prominent soft tissue in the thoracic inlet displaces the trachea slightly to the right, likely representing thyroid goiter. No change. IMPRESSION: Cardiac enlargement. Bilateral pulmonary infiltrates suggesting multifocal pneumonia or edema.  Electronically Signed   By: Burman Nieves M.D.   On: 05/04/2023 21:48     Family Communication: Patient understands current care plan. Will try to call husband to update.    Code Status: Limited: Do not attempt resuscitation (DNR) -DNR-LIMITED -Do Not Intubate/DNI   Disposition: Status is: Inpatient Remains inpatient appropriate because: hyponatremia correction.  Planned Discharge Destination: Home with Home Health    Time spent: 43 minutes  Author: Marcelino Duster, MD 05/06/2023 3:40 PM  For on call review [email or web address].

## 2023-05-06 NOTE — TOC CM/SW Note (Signed)
Transition of Care [facility]) - Inpatient Brief Assessment   Patient Details  Name: HARUMI COGER MRN: [id number] Date of Birth: Feb 15, 1932  [facility] CM/SW Contact:    Kreg Shropshire, RN Phone Number: 05/06/2023, 11:15 AM   Clinical Narrative: Brief assessment completed. Cm continue to follow for toc needs.   Transition of Care Asessment: Insurance and Status: Insurance coverage has been reviewed Patient has primary care physician: Yes     Prior/Current Home Services: No current home services Social Determinants of Health Reivew: SDOH reviewed no interventions necessary Readmission risk has been reviewed: Yes Transition of care needs: no transition of care needs at this time

## 2023-05-07 DIAGNOSIS — Z66 Do not resuscitate: Secondary | ICD-10-CM | POA: Diagnosis not present

## 2023-05-07 DIAGNOSIS — E871 Hypo-osmolality and hyponatremia: Secondary | ICD-10-CM | POA: Diagnosis not present

## 2023-05-07 DIAGNOSIS — I10 Essential (primary) hypertension: Secondary | ICD-10-CM | POA: Diagnosis not present

## 2023-05-07 DIAGNOSIS — E0590 Thyrotoxicosis, unspecified without thyrotoxic crisis or storm: Secondary | ICD-10-CM | POA: Diagnosis not present

## 2023-05-07 LAB — BASIC METABOLIC PANEL
Anion gap: 9 (ref 5–15)
BUN: 14 mg/dL (ref 8–23)
CO2: 24 mmol/L (ref 22–32)
Calcium: 8.2 mg/dL — ABNORMAL LOW (ref 8.9–10.3)
Chloride: 94 mmol/L — ABNORMAL LOW (ref 98–111)
Creatinine, Ser: 0.44 mg/dL (ref 0.44–1.00)
GFR, Estimated: >60 mL/min (ref >60)
Glucose, Bld: 94 mg/dL (ref 70–99)
Potassium: 3.5 mmol/L (ref 3.5–5.1)
Sodium: 127 mmol/L — ABNORMAL LOW (ref 135–145)

## 2023-05-07 LAB — KAPPA/LAMBDA LIGHT CHAINS
Kappa free light chain: 20.3 mg/L — ABNORMAL HIGH (ref 3.3–19.4)
Kappa, lambda light chain ratio: 1.6 (ref 0.26–1.65)
Lambda free light chains: 12.7 mg/L (ref 5.7–26.3)

## 2023-05-07 NOTE — Care Management Important Message (Signed)
Important Message  Patient Details  Name: Andrea Griffin MRN: [id number] Date of Birth: 1932-03-04   Medicare Important Message Given:  Yes     Johnell Comings 05/07/2023, 10:19 AM

## 2023-05-07 NOTE — Discharge Summary (Signed)
Physician Discharge Summary   Patient: Andrea Griffin MRN: [id number] DOB: [birth year]  Admit date:     05/04/2023  Discharge date: 05/07/23  Discharge Physician: Marcelino Duster   PCP: Marisue Ivan, MD   Recommendations at discharge:  {Tip this will not be part of the note when signed- Example include specific recommendations for outpatient follow-up, pending tests to follow-up on. (Optional):26781}  ***  Discharge Diagnoses: Principal Problem:   Hyponatremia Active Problems:   Leukocytosis   DNR (do not resuscitate)   Essential hypertension   GERD (gastroesophageal reflux disease)   Hyperthyroidism  Resolved Problems:   * No resolved hospital problems. [facility] Course: Andrea Griffin is an 89 year old female with history of hypertension, GERD, hyperlipidemia, who presents to the emergency department for chief concerns of abnormal labs and weakness.  Vitals in the ED showed temperature of 97.7, respiration rate of 18, heart rate 74, blood pressure 152/55, SpO2 of 95% on room air.  Serum Na of 114, K 4.0, chloride 77, bicarb 21, BUN of 18, serum creatinine of 0.64, EGFR greater than 60, nonfasting blood glucose 91, WBC 10.7, hemoglobin 13.4, platelets of 338.  Magnesium level is 1.8.  ED treatment: Sodium chloride 1 L bolus.  Assessment and Plan: * Hyponatremia Severe hyponatremia with mild symptoms Etiology workup in progress Patient is status post sodium chloride 1 L bolus per EDP Serum sodium monitoring every 2 hours, 3 times ordered on admission Goal increase is 6-8 in 24 hours Check serum osmolality, urine osmolality, urine sodium, UA with complete microscopic, Legionella pneumophelia in urine antigen Nephrology specialist has been consulted via staff message and Epic order Admit to stepdown, inpatient  Addendum: First repeat of serum sodium level remains unchanged.  Sodium chloride infusion at 100 mL per hour, 1 day  ordered  Hyperthyroidism Methimazole 2.5 mg p.o. resumed  Essential hypertension Hydralazine 5 mg IV every 8 hours as needed for SBP > 175, 4 days ordered  DNR (do not resuscitate) Confirmed with patient at bedside  Leukocytosis Etiology workup in progress, check a UA, Legionella urine antigen, portable chest x-ray      {Tip this will not be part of the note when signed Body mass index is 29.07 kg/m. , ,  (Optional):26781}  {(NOTE) Pain control PDMP Statment (Optional):26782} Consultants: *** Procedures performed: ***  Disposition: {Plan; Disposition:26390} Diet recommendation:  Discharge Diet Orders (From admission, onward)     Start     Ordered   05/07/23 0000  Diet - low sodium heart healthy        05/07/23 1138           {Diet_Plan:26776} DISCHARGE MEDICATION: Allergies as of 05/07/2023       Reactions   Ciprofloxacin Hives   Adhesive [tape]    Pulls off skin   Nitrofurantoin Other (See Comments)   Can not remember reaction   Phenazopyridine Other (See Comments)   Pt doesn't remember   Ranitidine Other (See Comments)   Can not remember reaction   Sulfa Antibiotics Other (See Comments)   Can not remember reaction        Medication List     TAKE these medications    acetaminophen 500 MG tablet Commonly known as: TYLENOL Take 1,000 mg by mouth 2 (two) times daily.   amLODipine 10 MG tablet Commonly known as: NORVASC Take 10 mg by mouth daily.   amoxicillin 500 MG capsule Commonly known as: AMOXIL Take 500 mg by mouth 2 (two) times daily.  atorvastatin 20 MG tablet Commonly known as: LIPITOR Take 20 mg by mouth daily.   BLUE-EMU PAIN RELIEF DRY EX Apply 1 application topically daily as needed (pain).   Calcium 600+D 600-800 MG-UNIT Tabs Generic drug: Calcium Carb-Cholecalciferol Take 2 tablets by mouth daily.   fexofenadine 180 MG tablet Commonly known as: ALLEGRA Take 180 mg by mouth daily.   fluticasone 50 MCG/ACT nasal  spray Commonly known as: FLONASE Place 2 sprays into both nostrils at bedtime.   Fluticasone-Salmeterol 100-50 MCG/DOSE Aepb Commonly known as: ADVAIR Inhale 1 puff into the lungs 2 (two) times daily.   losartan 100 MG tablet Commonly known as: COZAAR Take 100 mg by mouth daily.   methimazole 5 MG tablet Commonly known as: TAPAZOLE Take 2.5 mg by mouth daily.   omeprazole 20 MG capsule Commonly known as: PRILOSEC Take 20 mg by mouth 2 (two) times daily.   senna-docusate 8.6-50 MG tablet Commonly known as: Senokot-S Take 2 tablets by mouth daily.        Discharge Exam: Filed Weights   05/04/23 1725 05/05/23 1651  Weight: 68.5 kg 72.1 kg   ***  Condition at discharge: {DC Condition:26389}  The results of significant diagnostics from this hospitalization (including imaging, microbiology, ancillary and laboratory) are listed below for reference.   Imaging Studies: CT CHEST WO CONTRAST  Result Date: 05/06/2023 CLINICAL DATA:  Dyspnea on exertion. Bilateral pulmonary infiltrates on a recent portable chest radiograph. EXAM: CT CHEST WITHOUT CONTRAST TECHNIQUE: Multidetector CT imaging of the chest was performed following the standard protocol without IV contrast. RADIATION DOSE REDUCTION: This exam was performed according to the departmental dose-optimization program which includes automated exposure control, adjustment of the mA and/or kV according to patient size and/or use of iterative reconstruction technique. COMPARISON:  Chest radiographs dated 05/04/2023 and 09/14/2012. FINDINGS: Cardiovascular: Diffuse low density of the blood relative to the arterial walls. Coronary artery calcifications. Enlarged heart. No pericardial effusion. Mediastinum/Nodes: Diffusely enlarged thyroid gland with a substernal component on the right. No nodules seen. Not clinically significant; no follow-up imaging recommended (ref: J Am Coll Radiol. 2015 Feb;12(2): 143-50). No enlarged lymph nodes.   Unremarkable trachea and esophagus. Lungs/Pleura: Mild peripheral interstitial prominence with honeycombing in both lungs. Minimal linear atelectasis or scarring at the right lung base. Minimal biapical pleural and parenchymal scarring with minimal pleural calcification on the right. Small right lower lobe calcified granuloma. No airspace consolidation, noncalcified nodules or pleural fluid. Upper Abdomen: Unremarkable. Musculoskeletal: Thoracic and lower cervical spine degenerative changes. IMPRESSION: 1. No acute abnormality. 2. 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 3. Diffuse low density of the blood relative to the arterial walls, compatible with anemia. 4. Atheromatous coronary artery calcifications. 5. Cardiomegaly. Electronically Signed   By: Beckie Salts M.D.   On: 05/06/2023 18:23   ECHOCARDIOGRAM COMPLETE  Result Date: 05/06/2023    ECHOCARDIOGRAM REPORT   Patient Name:   Andrea Griffin Date of Exam: 05/05/2023 Medical Rec #:  [id number]       Height:       62.0 in Accession #:    [id number]      Weight:       159.0 lb Date of Birth:  August 06, 1932       BSA:          1.734 m Patient Age:    89 years        BP:           122/64 mmHg Patient Gender: F  HR:           72 bpm. Exam Location:  [facility] Procedure: 2D Echo, Cardiac Doppler and Color Doppler Indications:     I51.7 Cardiomegaly  History:         Patient has no prior history of Echocardiogram examinations.                  Risk Factors:Hypertension and Dyslipidemia.  Sonographer:     Daphine Deutscher RDCS Referring Phys:  [id number] Marcelino Duster Diagnosing Phys: Alwyn Pea MD IMPRESSIONS  1. Left ventricular ejection fraction, by estimation, is 70 to 75%. The left ventricle has hyperdynamic function. The left ventricle has no regional wall motion abnormalities. Left ventricular diastolic parameters are consistent with Grade II diastolic dysfunction (pseudonormalization).  2. Right ventricular  systolic function is normal. The right ventricular size is mildly enlarged.  3. Left atrial size was moderately dilated.  4. Right atrial size was mildly dilated.  5. The mitral valve is myxomatous. Mild mitral valve regurgitation.  6. The aortic valve is normal in structure. Aortic valve regurgitation is moderate. FINDINGS  Left Ventricle: Left ventricular ejection fraction, by estimation, is 70 to 75%. The left ventricle has hyperdynamic function. The left ventricle has no regional wall motion abnormalities. The left ventricular internal cavity size was normal in size. There is no left ventricular hypertrophy. Left ventricular diastolic parameters are consistent with Grade II diastolic dysfunction (pseudonormalization). Right Ventricle: The right ventricular size is mildly enlarged. No increase in right ventricular wall thickness. Right ventricular systolic function is normal. Left Atrium: Left atrial size was moderately dilated. Right Atrium: Right atrial size was mildly dilated. Pericardium: There is no evidence of pericardial effusion. Mitral Valve: The mitral valve is myxomatous. Mild mitral valve regurgitation. Tricuspid Valve: The tricuspid valve is normal in structure. Tricuspid valve regurgitation is mild. Aortic Valve: The aortic valve is normal in structure. Aortic valve regurgitation is moderate. Aortic regurgitation PHT measures 484 msec. Aortic valve mean gradient measures 10.4 mmHg. Aortic valve peak gradient measures 16.8 mmHg. Aortic valve area, by  VTI measures 1.71 cm. Pulmonic Valve: The pulmonic valve was normal in structure. Pulmonic valve regurgitation is trivial. Aorta: The ascending aorta was not well visualized. IAS/Shunts: No atrial level shunt detected by color flow Doppler.  LEFT VENTRICLE PLAX 2D LVIDd:         4.80 cm   Diastology LVIDs:         2.80 cm   LV e' medial:    7.29 cm/s LV PW:         0.80 cm   LV E/e' medial:  15.7 LV IVS:        0.80 cm   LV e' lateral:   10.20 cm/s  LVOT diam:     1.90 cm   LV E/e' lateral: 11.2 LV SV:         82 LV SV Index:   47 LVOT Area:     2.84 cm  RIGHT VENTRICLE             IVC RV Basal diam:  3.30 cm     IVC diam: 1.80 cm RV S prime:     17.87 cm/s TAPSE (M-mode): 2.9 cm LEFT ATRIUM             Index        RIGHT ATRIUM           Index LA diam:        5.20 cm  3.00 cm/m   RA Area:     18.90 cm LA Vol (A2C):   77.6 ml 44.76 ml/m  RA Volume:   55.50 ml  32.01 ml/m LA Vol (A4C):   52.7 ml 30.39 ml/m LA Biplane Vol: 63.8 ml 36.80 ml/m  AORTIC VALVE AV Area (Vmax):    2.12 cm AV Area (Vmean):   1.97 cm AV Area (VTI):     1.71 cm AV Vmax:           204.80 cm/s AV Vmean:          154.800 cm/s AV VTI:            0.477 m AV Peak Grad:      16.8 mmHg AV Mean Grad:      10.4 mmHg LVOT Vmax:         153.00 cm/s LVOT Vmean:        107.333 cm/s LVOT VTI:          0.288 m LVOT/AV VTI ratio: 0.60 AI PHT:            484 msec  AORTA Ao Root diam: 2.90 cm Ao Asc diam:  3.10 cm MITRAL VALVE MV Area (PHT): 3.88 cm     SHUNTS MV Decel Time: 196 msec     Systemic VTI:  0.29 m MV E velocity: 114.50 cm/s  Systemic Diam: 1.90 cm MV A velocity: 74.80 cm/s MV E/A ratio:  1.53 Dwayne Salome Arnt MD Electronically signed by Alwyn Pea MD Signature Date/Time: 05/06/2023/4:39:28 PM    Final    DG Chest Port 1 View  Result Date: 05/04/2023 CLINICAL DATA:  Dyspnea on exertion. EXAM: PORTABLE CHEST 1 VIEW COMPARISON:  09/14/2012 FINDINGS: Cardiac enlargement. Perihilar and basilar infiltration suggesting multifocal pneumonia or edema. No pleural effusions. No pneumothorax. Mediastinal contours appear intact. Prominent soft tissue in the thoracic inlet displaces the trachea slightly to the right, likely representing thyroid goiter. No change. IMPRESSION: Cardiac enlargement. Bilateral pulmonary infiltrates suggesting multifocal pneumonia or edema. Electronically Signed   By: Burman Nieves M.D.   On: 05/04/2023 21:48    Microbiology: Results for orders placed or  performed during the hospital encounter of 05/04/23  MRSA Next Gen by PCR, Nasal     Status: None   Collection Time: 05/05/23  4:51 PM   Specimen: Nasal Mucosa; Nasal Swab  Result Value Ref Range Status   MRSA by PCR Next Gen NOT DETECTED NOT DETECTED Final    Comment: (NOTE) The GeneXpert MRSA Assay (FDA approved for NASAL specimens only), is one component of a comprehensive MRSA colonization surveillance program. It is not intended to diagnose MRSA infection nor to guide or monitor treatment for MRSA infections. Test performance is not FDA approved in patients less than 38 years old. Performed at [facility], [address] [place], [place] [address]     Labs: CBC: Recent Labs  Lab 05/05/23 0534  WBC 10.5  HGB 12.7  HCT 35.5*  MCV 93.2  PLT 336   Basic Metabolic Panel: Recent Labs  Lab 05/04/23 1808 05/04/23 2114 05/05/23 0057 05/05/23 0534 05/05/23 1513 05/05/23 2038 05/06/23 0458 05/07/23 0928  NA 114*   < > 118* 122* 122*  --  125* 127*  K 4.0  --   --  3.2* 2.8*  --  4.0 3.5  CL 77*  --   --  86* 86*  --  93* 94*  CO2 21*  --   --  25  26  --  24 24  GLUCOSE 91  --   --  88 125*  --  86 94  BUN 18  --   --  11 11  --  10 14  CREATININE 0.64  --   --  0.38* 0.41*  --  0.34* 0.44  CALCIUM 8.7*  --   --  8.3* 8.2*  --  8.4* 8.2*  MG 1.8  --   --   --   --  1.7  --   --    < > = values in this interval not displayed.   Liver Function Tests: No results for input(s): "AST", "ALT", "ALKPHOS", "BILITOT", "PROT", "ALBUMIN" in the last 168 hours. CBG: Recent Labs  Lab 05/05/23 1651  GLUCAP 118*    Discharge time spent: {LESS THAN/GREATER UJWJ:19147} 30 minutes.  Signed: Marcelino Duster, MD [facility] 05/07/2023

## 2023-05-07 NOTE — Evaluation (Addendum)
Physical Therapy Evaluation Patient Details Name: Andrea Griffin MRN: [id number] DOB: 02-01-1932 Today's Date: 05/07/2023  History of Present Illness  Pt is a 89 y/o female presenting due to weakness and hyponatremia. PMH includes HTN, GERD, HLD, and hyperthyroidism.  Clinical Impression   Pt presents laying in bed with family in room, no complaints of pain. She currently lives with her husband in a 1 story home with 3 stairs to enter and a rail on the L. PTA she was independent with ADLs and did not use an AD at baseline.   Pt performed bed mobility and transfers modI, demonstrating safe mobility practices. Pt ambulated ~230ft (~100 with RW, rest with no AD) with CGA and 2-3 rest breaks, noting excess b/l lateral trunk lean that increases without RW. She also ascended/ descended 6 steps with a rail on the L and CGA. Pt completed DGI and scored 14/24, placing her at increased risk of falls with significant impairments in walking with head turns and steps. She would benefit from continued skilled PT to maximize functional abilities.       If plan is discharge home, recommend the following: Assistance with cooking/housework;Help with stairs or ramp for entrance;Assist for transportation;A little help with walking and/or transfers   Can travel by private vehicle        Equipment Recommendations None recommended by PT  Recommendations for Other Services       Functional Status Assessment Patient has had a recent decline in their functional status and demonstrates the ability to make significant improvements in function in a reasonable and predictable amount of time.     Precautions / Restrictions Precautions Precautions: None Restrictions Weight Bearing Restrictions: No      Mobility  Bed Mobility Overal bed mobility: Modified Independent             General bed mobility comments: observed supine>sit, able to perform safely and efficiently    Transfers Overall transfer  level: Modified independent Equipment used: Rolling walker (2 wheels)               General transfer comment: observed sit<>stand    Ambulation/Gait Ambulation/Gait assistance: Contact guard assist Gait Distance (Feet): 280 Feet Assistive device: Rolling walker (2 wheels), None Gait Pattern/deviations: Decreased step length - right, Decreased step length - left Gait velocity: decreased     General Gait Details: ~141ft with RW, b/l excess lateral trunk lean that increases without AD, 2-3 standing rest breaks to catch breath  Stairs Stairs: Yes Stairs assistance: Contact guard assist Stair Management: One rail Left, Alternating pattern, Step to pattern Number of Stairs: 6 General stair comments: ascend/descend, some unsteadiness with alternating pattern that improved with transition to step to pattern  Wheelchair Mobility     Tilt Bed    Modified Rankin (Stroke Patients Only)       Balance Overall balance assessment: Needs assistance Sitting-balance support: Bilateral upper extremity supported, Feet supported, No upper extremity supported Sitting balance-Leahy Scale: Normal     Standing balance support: No upper extremity supported, Bilateral upper extremity supported, During functional activity Standing balance-Leahy Scale: Fair                   Standardized Balance Assessment Standardized Balance Assessment : Dynamic Gait Index   Dynamic Gait Index Level Surface: Mild Impairment Change in Gait Speed: Mild Impairment Gait with Horizontal Head Turns: Moderate Impairment Gait with Vertical Head Turns: Moderate Impairment Gait and Pivot Turn: Mild Impairment Step Over Obstacle: Mild Impairment  Step Around Obstacles: Normal Steps: Moderate Impairment Total Score: 14       Pertinent Vitals/Pain Pain Assessment Pain Assessment: No/denies pain    Home Living Family/patient expects to be discharged to:: Private residence Living Arrangements:  Spouse/significant other Available Help at Discharge: Family Type of Home: House Home Access: Stairs to enter Entrance Stairs-Rails: Left Entrance Stairs-Number of Steps: 3   Home Layout: One level Home Equipment: Agricultural consultant (2 wheels);Rollator (4 wheels);Cane - single point      Prior Function Prior Level of Function : Independent/Modified Independent             Mobility Comments: did not use AD at baseline ADLs Comments: IND with all ADLs     Extremity/Trunk Assessment   Upper Extremity Assessment Upper Extremity Assessment: Overall WFL for tasks assessed    Lower Extremity Assessment Lower Extremity Assessment: Generalized weakness       Communication   Communication Communication: No apparent difficulties  Cognition Arousal: Alert Behavior During Therapy: WFL for tasks assessed/performed Overall Cognitive Status: Within Functional Limits for tasks assessed                                          General Comments      Exercises     Assessment/Plan    PT Assessment Patient needs continued PT services  PT Problem List Decreased strength;Decreased range of motion;Decreased activity tolerance;Decreased balance;Decreased mobility;Decreased knowledge of use of DME       PT Treatment Interventions DME instruction;Gait training;Stair training;Functional mobility training;Therapeutic activities;Therapeutic exercise;Balance training;Neuromuscular re-education;Patient/family education    PT Goals (Current goals can be found in the Care Plan section)  Acute Rehab PT Goals Patient Stated Goal: return home PT Goal Formulation: With patient Time For Goal Achievement: 05/21/23 Potential to Achieve Goals: Good    Frequency Min 1X/week     Co-evaluation               AM-PAC PT "6 Clicks" Mobility  Outcome Measure Help needed turning from your back to your side while in a flat bed without using bedrails?: None Help needed moving  from lying on your back to sitting on the side of a flat bed without using bedrails?: None Help needed moving to and from a bed to a chair (including a wheelchair)?: None Help needed standing up from a chair using your arms (e.g., wheelchair or bedside chair)?: None Help needed to walk in hospital room?: A Little Help needed climbing 3-5 steps with a railing? : A Little 6 Click Score: 22    End of Session Equipment Utilized During Treatment: Gait belt Activity Tolerance: Patient tolerated treatment well Patient left: in bed;with call bell/phone within reach;with family/visitor present;Other (comment) (seated EOB with lunch) Nurse Communication: Mobility status PT Visit Diagnosis: Unsteadiness on feet (R26.81);Other abnormalities of gait and mobility (R26.89);Difficulty in walking, not elsewhere classified (R26.2)    Time: 4098-1191 PT Time Calculation (min) (ACUTE ONLY): 27 min   Charges:   PT Evaluation $PT Eval Low Complexity: 1 Low PT Treatments $Therapeutic Activity: 23-37 mins PT General Charges $$ ACUTE PT VISIT: 1 Visit        Despina Boan, PT, SPT 3:08 PM,05/07/23

## 2023-05-07 NOTE — Progress Notes (Signed)
[facility]  ROUNDING NOTE   Subjective:   Patient seen sitting up in bed Alert and oriented Appears in good spirit Family states she ate a good breakfast  Sodium 127   Objective:  Vital signs in last 24 hours:  Temp:  [98.1 F (36.7 C)-98.8 F (37.1 C)] 98.1 F (36.7 C) (09/06 1224) Pulse Rate:  [65-78] 67 (09/06 1224) Resp:  [16-22] 18 (09/06 1224) BP: (113-153)/(52-58) 145/54 (09/06 1224) SpO2:  [95 %-99 %] 97 % (09/06 1224)  Weight change:  Filed Weights   05/04/23 1725 05/05/23 1651  Weight: 68.5 kg 72.1 kg    Intake/Output: I/O last 3 completed shifts: In: 520 [P.O.:420; IV Piggyback:100] Out: 400 [Urine:400]   Intake/Output this shift:  No intake/output data recorded.  Physical Exam: General: NAD  Head: Normocephalic, atraumatic. Moist oral mucosal membranes  Eyes: Anicteric  Lungs:  Clear to auscultation, normal effort  Heart: Regular rate and rhythm  Abdomen:  Soft, nontender  Extremities:  No peripheral edema.  Neurologic: Alert, moving all four extremities  Skin: No lesions       Basic Metabolic Panel: Recent Labs  Lab 05/04/23 1808 05/04/23 2114 05/05/23 0057 05/05/23 0534 05/05/23 1513 05/05/23 2038 05/06/23 0458 05/07/23 0928  NA 114*   < > 118* 122* 122*  --  125* 127*  K 4.0  --   --  3.2* 2.8*  --  4.0 3.5  CL 77*  --   --  86* 86*  --  93* 94*  CO2 21*  --   --  25 26  --  24 24  GLUCOSE 91  --   --  88 125*  --  86 94  BUN 18  --   --  11 11  --  10 14  CREATININE 0.64  --   --  0.38* 0.41*  --  0.34* 0.44  CALCIUM 8.7*  --   --  8.3* 8.2*  --  8.4* 8.2*  MG 1.8  --   --   --   --  1.7  --   --    < > = values in this interval not displayed.    Liver Function Tests: No results for input(s): "AST", "ALT", "ALKPHOS", "BILITOT", "PROT", "ALBUMIN" in the last 168 hours. No results for input(s): "LIPASE", "AMYLASE" in the last 168 hours. No results for input(s): "AMMONIA" in the last 168 hours.  CBC: Recent  Labs  Lab 05/05/23 0534  WBC 10.5  HGB 12.7  HCT 35.5*  MCV 93.2  PLT 336    Cardiac Enzymes: No results for input(s): "CKTOTAL", "CKMB", "CKMBINDEX", "TROPONINI" in the last 168 hours.  BNP: Invalid input(s): "POCBNP"  CBG: Recent Labs  Lab 05/05/23 1651  GLUCAP 118*    Microbiology: Results for orders placed or performed during the hospital encounter of 05/04/23  MRSA Next Gen by PCR, Nasal     Status: None   Collection Time: 05/05/23  4:51 PM   Specimen: Nasal Mucosa; Nasal Swab  Result Value Ref Range Status   MRSA by PCR Next Gen NOT DETECTED NOT DETECTED Final    Comment: (NOTE) The GeneXpert MRSA Assay (FDA approved for NASAL specimens only), is one component of a comprehensive MRSA colonization surveillance program. It is not intended to diagnose MRSA infection nor to guide or monitor treatment for MRSA infections. Test performance is not FDA approved in patients less than 56 years old. Performed at [facility], [address] [place],  [place] [address]     Coagulation Studies: No results for input(s): "LABPROT", "INR" in the last 72 hours.  Urinalysis: Recent Labs    05/05/23 0057  COLORURINE STRAW*  LABSPEC 1.003*  PHURINE 7.0  GLUCOSEU NEGATIVE  HGBUR NEGATIVE  BILIRUBINUR NEGATIVE  KETONESUR NEGATIVE  PROTEINUR NEGATIVE  NITRITE NEGATIVE  LEUKOCYTESUR MODERATE*      Imaging: CT CHEST WO CONTRAST  Result Date: 05/06/2023 CLINICAL DATA:  Dyspnea on exertion. Bilateral pulmonary infiltrates on a recent portable chest radiograph. EXAM: CT CHEST WITHOUT CONTRAST TECHNIQUE: Multidetector CT imaging of the chest was performed following the standard protocol without IV contrast. RADIATION DOSE REDUCTION: This exam was performed according to the departmental dose-optimization program which includes automated exposure control, adjustment of the mA and/or kV according to patient size and/or use of iterative reconstruction technique.  COMPARISON:  Chest radiographs dated 05/04/2023 and 09/14/2012. FINDINGS: Cardiovascular: Diffuse low density of the blood relative to the arterial walls. Coronary artery calcifications. Enlarged heart. No pericardial effusion. Mediastinum/Nodes: Diffusely enlarged thyroid gland with a substernal component on the right. No nodules seen. Not clinically significant; no follow-up imaging recommended (ref: J Am Coll Radiol. 2015 Feb;12(2): 143-50). No enlarged lymph nodes.  Unremarkable trachea and esophagus. Lungs/Pleura: Mild peripheral interstitial prominence with honeycombing in both lungs. Minimal linear atelectasis or scarring at the right lung base. Minimal biapical pleural and parenchymal scarring with minimal pleural calcification on the right. Small right lower lobe calcified granuloma. No airspace consolidation, noncalcified nodules or pleural fluid. Upper Abdomen: Unremarkable. Musculoskeletal: Thoracic and lower cervical spine degenerative changes. IMPRESSION: 1. No acute abnormality. 2. 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 3. Diffuse low density of the blood relative to the arterial walls, compatible with anemia. 4. Atheromatous coronary artery calcifications. 5. Cardiomegaly. Electronically Signed   By: Beckie Salts M.D.   On: 05/06/2023 18:23   ECHOCARDIOGRAM COMPLETE  Result Date: 05/06/2023    ECHOCARDIOGRAM REPORT   Patient Name:   Andrea Griffin Date of Exam: 05/05/2023 Medical Rec #:  [id number]       Height:       62.0 in Accession #:    [id number]      Weight:       159.0 lb Date of Birth:  [birth year]       BSA:          1.734 m Patient Age:    89 years        BP:           122/64 mmHg Patient Gender: F               HR:           72 bpm. Exam Location:  [facility] Procedure: 2D Echo, Cardiac Doppler and Color Doppler Indications:     I51.7 Cardiomegaly  History:         Patient has no prior history of Echocardiogram examinations.                  Risk  Factors:Hypertension and Dyslipidemia.  Sonographer:     Daphine Deutscher RDCS Referring Phys:  [id number] Marcelino Duster Diagnosing Phys: Alwyn Pea MD IMPRESSIONS  1. Left ventricular ejection fraction, by estimation, is 70 to 75%. The left ventricle has hyperdynamic function. The left ventricle has no regional wall motion abnormalities. Left ventricular diastolic parameters are consistent with Grade II diastolic dysfunction (pseudonormalization).  2. Right ventricular systolic function is normal. The right ventricular size is mildly enlarged.  3. Left atrial size was moderately dilated.  4. Right atrial size was mildly dilated.  5. The mitral valve is myxomatous. Mild mitral valve regurgitation.  6. The aortic valve is normal in structure. Aortic valve regurgitation is moderate. FINDINGS  Left Ventricle: Left ventricular ejection fraction, by estimation, is 70 to 75%. The left ventricle has hyperdynamic function. The left ventricle has no regional wall motion abnormalities. The left ventricular internal cavity size was normal in size. There is no left ventricular hypertrophy. Left ventricular diastolic parameters are consistent with Grade II diastolic dysfunction (pseudonormalization). Right Ventricle: The right ventricular size is mildly enlarged. No increase in right ventricular wall thickness. Right ventricular systolic function is normal. Left Atrium: Left atrial size was moderately dilated. Right Atrium: Right atrial size was mildly dilated. Pericardium: There is no evidence of pericardial effusion. Mitral Valve: The mitral valve is myxomatous. Mild mitral valve regurgitation. Tricuspid Valve: The tricuspid valve is normal in structure. Tricuspid valve regurgitation is mild. Aortic Valve: The aortic valve is normal in structure. Aortic valve regurgitation is moderate. Aortic regurgitation PHT measures 484 msec. Aortic valve mean gradient measures 10.4 mmHg. Aortic valve peak gradient measures  16.8 mmHg. Aortic valve area, by  VTI measures 1.71 cm. Pulmonic Valve: The pulmonic valve was normal in structure. Pulmonic valve regurgitation is trivial. Aorta: The ascending aorta was not well visualized. IAS/Shunts: No atrial level shunt detected by color flow Doppler.  LEFT VENTRICLE PLAX 2D LVIDd:         4.80 cm   Diastology LVIDs:         2.80 cm   LV e' medial:    7.29 cm/s LV PW:         0.80 cm   LV E/e' medial:  15.7 LV IVS:        0.80 cm   LV e' lateral:   10.20 cm/s LVOT diam:     1.90 cm   LV E/e' lateral: 11.2 LV SV:         82 LV SV Index:   47 LVOT Area:     2.84 cm  RIGHT VENTRICLE             IVC RV Basal diam:  3.30 cm     IVC diam: 1.80 cm RV S prime:     17.87 cm/s TAPSE (M-mode): 2.9 cm LEFT ATRIUM             Index        RIGHT ATRIUM           Index LA diam:        5.20 cm 3.00 cm/m   RA Area:     18.90 cm LA Vol (A2C):   77.6 ml 44.76 ml/m  RA Volume:   55.50 ml  32.01 ml/m LA Vol (A4C):   52.7 ml 30.39 ml/m LA Biplane Vol: 63.8 ml 36.80 ml/m  AORTIC VALVE AV Area (Vmax):    2.12 cm AV Area (Vmean):   1.97 cm AV Area (VTI):     1.71 cm AV Vmax:           204.80 cm/s AV Vmean:          154.800 cm/s AV VTI:            0.477 m AV Peak Grad:      16.8 mmHg AV Mean Grad:      10.4 mmHg LVOT Vmax:         153.00 cm/s LVOT  Vmean:        107.333 cm/s LVOT VTI:          0.288 m LVOT/AV VTI ratio: 0.60 AI PHT:            484 msec  AORTA Ao Root diam: 2.90 cm Ao Asc diam:  3.10 cm MITRAL VALVE MV Area (PHT): 3.88 cm     SHUNTS MV Decel Time: 196 msec     Systemic VTI:  0.29 m MV E velocity: 114.50 cm/s  Systemic Diam: 1.90 cm MV A velocity: 74.80 cm/s MV E/A ratio:  1.53 Dwayne D Callwood MD Electronically signed by Alwyn Pea MD Signature Date/Time: 05/06/2023/4:39:28 PM    Final      Medications:    cefTRIAXone (ROCEPHIN)  IV 2 g (05/06/23 2144)    amLODipine  10 mg Oral Daily   atorvastatin  20 mg Oral Daily   azithromycin  500 mg Oral QHS   Chlorhexidine Gluconate  Cloth  6 each Topical Daily   enoxaparin (LOVENOX) injection  40 mg Subcutaneous Q24H   feeding supplement  237 mL Oral BID BM   fluticasone  2 spray Each Nare QHS   methimazole  2.5 mg Oral Daily   mometasone-formoterol  2 puff Inhalation BID   pantoprazole  40 mg Oral BID   acetaminophen **OR** acetaminophen, hydrALAZINE, ipratropium-albuterol, ondansetron **OR** ondansetron (ZOFRAN) IV, senna-docusate  Assessment/ Plan:  Ms. Andrea Griffin is a 89 y.o.  female with medical problems of hypertension, GERD, hyperlipidemia presented to the emergency room for abnormal labs.    Hyponatremia Differential diagnosis include excessive fluid intake and poor protein/calories intake.  Patient is not on diuretics. Urine output indicates low specific gravity suggesting adequate free water clearance.  Chest x-ray showed cardiac enlargement, bilateral pulmonary infiltrates suggesting multifocal pneumonia or edema.   Plan- -Sodium improving, 127 today -Continue Ensure (chocolate) protein and calorie supplements -2D echo shows EF 70-75% with a grade 2 diastolic dysfunction.  -Kappa lambda ratio and SPEP pending -Patient cleared to discharge from renal stance and can follow up with PCP for labs.  - Patient encouraged to maintain oral intake.      LOS: 3 Emelly Wurtz 9/6/20242:59 PM

## 2023-05-10 LAB — PROTEIN ELECTROPHORESIS, SERUM
A/G Ratio: 1.3 (ref 0.7–1.7)
Albumin ELP: 3.4 g/dL (ref 2.9–4.4)
Alpha-1-Globulin: 0.3 g/dL (ref 0.0–0.4)
Alpha-2-Globulin: 0.6 g/dL (ref 0.4–1.0)
Beta Globulin: 0.9 g/dL (ref 0.7–1.3)
Gamma Globulin: 0.9 g/dL (ref 0.4–1.8)
Globulin, Total: 2.7 g/dL (ref 2.2–3.9)
Total Protein ELP: 6.1 g/dL (ref 6.0–8.5)

## 2023-05-10 NOTE — Group Note (Deleted)

## 2023-07-14 ENCOUNTER — Emergency Department: Payer: Medicare PPO

## 2023-07-14 ENCOUNTER — Other Ambulatory Visit: Payer: Self-pay

## 2023-07-14 ENCOUNTER — Inpatient Hospital Stay
Admission: EM | Admit: 2023-07-14 | Discharge: 2023-07-17 | DRG: 872 | Disposition: A | Discharge status: Home / Self Care | Payer: Medicare PPO | Attending: Osteopathic Medicine | Admitting: Osteopathic Medicine

## 2023-07-14 ENCOUNTER — Other Ambulatory Visit
Admission: RE | Admit: 2023-07-14 | Discharge: 2023-07-14 | Disposition: A | Discharge status: Home / Self Care | Payer: Medicare PPO | Source: Ambulatory Visit | Attending: Family Medicine | Admitting: Family Medicine

## 2023-07-14 DIAGNOSIS — I4891 Unspecified atrial fibrillation: Secondary | ICD-10-CM | POA: Insufficient documentation

## 2023-07-14 DIAGNOSIS — E871 Hypo-osmolality and hyponatremia: Secondary | ICD-10-CM | POA: Diagnosis present

## 2023-07-14 DIAGNOSIS — Z7901 Long term (current) use of anticoagulants: Secondary | ICD-10-CM | POA: Diagnosis not present

## 2023-07-14 DIAGNOSIS — D649 Anemia, unspecified: Secondary | ICD-10-CM | POA: Diagnosis present

## 2023-07-14 DIAGNOSIS — N390 Urinary tract infection, site not specified: Secondary | ICD-10-CM

## 2023-07-14 DIAGNOSIS — E876 Hypokalemia: Secondary | ICD-10-CM | POA: Diagnosis present

## 2023-07-14 DIAGNOSIS — A4151 Sepsis due to Escherichia coli [E. coli]: Secondary | ICD-10-CM | POA: Diagnosis present

## 2023-07-14 DIAGNOSIS — I2489 Other forms of acute ischemic heart disease: Secondary | ICD-10-CM | POA: Diagnosis present

## 2023-07-14 DIAGNOSIS — E785 Hyperlipidemia, unspecified: Secondary | ICD-10-CM | POA: Diagnosis present

## 2023-07-14 DIAGNOSIS — I499 Cardiac arrhythmia, unspecified: Secondary | ICD-10-CM | POA: Insufficient documentation

## 2023-07-14 DIAGNOSIS — I480 Paroxysmal atrial fibrillation: Secondary | ICD-10-CM

## 2023-07-14 DIAGNOSIS — Z91048 Other nonmedicinal substance allergy status: Secondary | ICD-10-CM

## 2023-07-14 DIAGNOSIS — Z1152 Encounter for screening for COVID-19: Secondary | ICD-10-CM | POA: Diagnosis not present

## 2023-07-14 DIAGNOSIS — Z888 Allergy status to other drugs, medicaments and biological substances status: Secondary | ICD-10-CM | POA: Diagnosis not present

## 2023-07-14 DIAGNOSIS — E669 Obesity, unspecified: Secondary | ICD-10-CM | POA: Diagnosis present

## 2023-07-14 DIAGNOSIS — R079 Chest pain, unspecified: Secondary | ICD-10-CM | POA: Diagnosis not present

## 2023-07-14 DIAGNOSIS — Z7951 Long term (current) use of inhaled steroids: Secondary | ICD-10-CM

## 2023-07-14 DIAGNOSIS — Z981 Arthrodesis status: Secondary | ICD-10-CM

## 2023-07-14 DIAGNOSIS — I214 Non-ST elevation (NSTEMI) myocardial infarction: Secondary | ICD-10-CM | POA: Diagnosis present

## 2023-07-14 DIAGNOSIS — Z66 Do not resuscitate: Secondary | ICD-10-CM | POA: Diagnosis present

## 2023-07-14 DIAGNOSIS — K219 Gastro-esophageal reflux disease without esophagitis: Secondary | ICD-10-CM | POA: Diagnosis present

## 2023-07-14 DIAGNOSIS — E878 Other disorders of electrolyte and fluid balance, not elsewhere classified: Secondary | ICD-10-CM | POA: Diagnosis present

## 2023-07-14 DIAGNOSIS — Z79899 Other long term (current) drug therapy: Secondary | ICD-10-CM

## 2023-07-14 DIAGNOSIS — Z881 Allergy status to other antibiotic agents status: Secondary | ICD-10-CM

## 2023-07-14 DIAGNOSIS — I2510 Atherosclerotic heart disease of native coronary artery without angina pectoris: Secondary | ICD-10-CM | POA: Diagnosis present

## 2023-07-14 DIAGNOSIS — I10 Essential (primary) hypertension: Secondary | ICD-10-CM | POA: Diagnosis present

## 2023-07-14 DIAGNOSIS — E861 Hypovolemia: Secondary | ICD-10-CM | POA: Diagnosis present

## 2023-07-14 DIAGNOSIS — J45909 Unspecified asthma, uncomplicated: Secondary | ICD-10-CM | POA: Diagnosis present

## 2023-07-14 DIAGNOSIS — Z96642 Presence of left artificial hip joint: Secondary | ICD-10-CM | POA: Diagnosis present

## 2023-07-14 DIAGNOSIS — A419 Sepsis, unspecified organism: Secondary | ICD-10-CM

## 2023-07-14 DIAGNOSIS — E039 Hypothyroidism, unspecified: Secondary | ICD-10-CM | POA: Diagnosis present

## 2023-07-14 DIAGNOSIS — I4819 Other persistent atrial fibrillation: Secondary | ICD-10-CM | POA: Diagnosis present

## 2023-07-14 DIAGNOSIS — J189 Pneumonia, unspecified organism: Secondary | ICD-10-CM

## 2023-07-14 DIAGNOSIS — E0590 Thyrotoxicosis, unspecified without thyrotoxic crisis or storm: Secondary | ICD-10-CM | POA: Diagnosis present

## 2023-07-14 DIAGNOSIS — Z85820 Personal history of malignant melanoma of skin: Secondary | ICD-10-CM | POA: Diagnosis not present

## 2023-07-14 DIAGNOSIS — Z823 Family history of stroke: Secondary | ICD-10-CM

## 2023-07-14 DIAGNOSIS — Z882 Allergy status to sulfonamides status: Secondary | ICD-10-CM

## 2023-07-14 HISTORY — DX: Nonrheumatic mitral (valve) insufficiency: I34.0

## 2023-07-14 HISTORY — DX: Other persistent atrial fibrillation: I48.19

## 2023-07-14 HISTORY — DX: Nonrheumatic aortic (valve) insufficiency: I35.1

## 2023-07-14 HISTORY — DX: Other ill-defined heart diseases: I51.89

## 2023-07-14 LAB — URINALYSIS, W/ REFLEX TO CULTURE (INFECTION SUSPECTED)
Bilirubin Urine: NEGATIVE
Glucose, UA: NEGATIVE mg/dL
Hgb urine dipstick: NEGATIVE
Ketones, ur: NEGATIVE mg/dL
Nitrite: NEGATIVE
Protein, ur: 100 mg/dL — AB
Specific Gravity, Urine: 1.018 (ref 1.005–1.030)
WBC, UA: >50 WBC/hpf (ref 0–5)
pH: 5 (ref 5.0–8.0)

## 2023-07-14 LAB — CBC
HCT: 35.3 % — ABNORMAL LOW (ref 36.0–46.0)
Hemoglobin: 11.9 g/dL — ABNORMAL LOW (ref 12.0–15.0)
MCH: 32.9 pg (ref 26.0–34.0)
MCHC: 33.7 g/dL (ref 30.0–36.0)
MCV: 97.5 fL (ref 80.0–100.0)
Platelets: 300 10*3/uL (ref 150–400)
RBC: 3.62 MIL/uL — ABNORMAL LOW (ref 3.87–5.11)
RDW: 12.3 % (ref 11.5–15.5)
WBC: 8.3 10*3/uL (ref 4.0–10.5)
nRBC: 0 % (ref 0.0–0.2)

## 2023-07-14 LAB — T4, FREE: Free T4: 1.16 ng/dL — ABNORMAL HIGH (ref 0.61–1.12)

## 2023-07-14 LAB — BASIC METABOLIC PANEL
Anion gap: 11 (ref 5–15)
BUN: 14 mg/dL (ref 8–23)
CO2: 22 mmol/L (ref 22–32)
Calcium: 8.2 mg/dL — ABNORMAL LOW (ref 8.9–10.3)
Chloride: 94 mmol/L — ABNORMAL LOW (ref 98–111)
Creatinine, Ser: 0.55 mg/dL (ref 0.44–1.00)
GFR, Estimated: >60 mL/min (ref >60)
Glucose, Bld: 130 mg/dL — ABNORMAL HIGH (ref 70–99)
Potassium: 4 mmol/L (ref 3.5–5.1)
Sodium: 127 mmol/L — ABNORMAL LOW (ref 135–145)

## 2023-07-14 LAB — APTT: aPTT: 41 s — ABNORMAL HIGH (ref 24–36)

## 2023-07-14 LAB — HEPATIC FUNCTION PANEL
ALT: 34 U/L (ref 0–44)
AST: 39 U/L (ref 15–41)
Albumin: 3.2 g/dL — ABNORMAL LOW (ref 3.5–5.0)
Alkaline Phosphatase: 110 U/L (ref 38–126)
Bilirubin, Direct: 0.3 mg/dL — ABNORMAL HIGH (ref 0.0–0.2)
Indirect Bilirubin: 0.4 mg/dL (ref 0.3–0.9)
Total Bilirubin: 0.7 mg/dL (ref <1.2)
Total Protein: 6.7 g/dL (ref 6.5–8.1)

## 2023-07-14 LAB — TROPONIN I (HIGH SENSITIVITY)
Troponin I (High Sensitivity): 613 ng/L (ref <18)
Troponin I (High Sensitivity): 605 ng/L (ref <18)
Troponin I (High Sensitivity): 553 ng/L (ref <18)

## 2023-07-14 LAB — MAGNESIUM: Magnesium: 1.8 mg/dL (ref 1.7–2.4)

## 2023-07-14 LAB — PROTIME-INR
INR: 1.2 (ref 0.8–1.2)
Prothrombin Time: 15.2 s (ref 11.4–15.2)

## 2023-07-14 LAB — HEPARIN LEVEL (UNFRACTIONATED): Heparin Unfractionated: <0.1 [IU]/mL — ABNORMAL LOW (ref 0.30–0.70)

## 2023-07-14 LAB — LACTIC ACID, PLASMA: Lactic Acid, Venous: 1 mmol/L (ref 0.5–1.9)

## 2023-07-14 LAB — TSH: TSH: 1.468 u[IU]/mL (ref 0.350–4.500)

## 2023-07-14 MED ORDER — HEPARIN BOLUS VIA INFUSION
4000.0000 [IU] | Freq: Once | INTRAVENOUS | Status: AC
  Administered 2023-07-14: 4000 [IU] via INTRAVENOUS
  Filled 2023-07-14: qty 4000

## 2023-07-14 MED ORDER — DEXTROSE 5 % IV SOLN: 500.0000 mg | INTRAVENOUS | Status: DC

## 2023-07-14 MED ORDER — ACETAMINOPHEN 325 MG PO TABS
650.0000 mg | ORAL_TABLET | Freq: Four times a day (QID) | ORAL | Status: DC | PRN
  Administered 2023-07-15 – 2023-07-16 (×3): 650 mg via ORAL
  Filled 2023-07-14 (×4): qty 2

## 2023-07-14 MED ORDER — SODIUM CHLORIDE 0.9 % IV SOLN: 2.0000 g | INTRAVENOUS | Status: DC

## 2023-07-14 MED ORDER — ATORVASTATIN CALCIUM 20 MG PO TABS: 20.0000 mg | ORAL_TABLET | Freq: Every day | ORAL | Status: DC

## 2023-07-14 MED ORDER — IOHEXOL 350 MG/ML SOLN
75.0000 mL | Freq: Once | INTRAVENOUS | Status: AC | PRN
  Administered 2023-07-14: 75 mL via INTRAVENOUS

## 2023-07-14 MED ORDER — MORPHINE SULFATE (PF) 2 MG/ML IV SOLN: 2.0000 mg | INTRAVENOUS | Status: DC | PRN

## 2023-07-14 MED ORDER — ONDANSETRON HCL 4 MG PO TABS: 4.0000 mg | ORAL_TABLET | Freq: Four times a day (QID) | ORAL | Status: DC | PRN

## 2023-07-14 MED ORDER — METOPROLOL SUCCINATE ER 25 MG PO TB24
25.0000 mg | ORAL_TABLET | Freq: Every day | ORAL | Status: DC
  Administered 2023-07-15: 25 mg via ORAL
  Filled 2023-07-14: qty 1

## 2023-07-14 MED ORDER — METHIMAZOLE 2.5 MG HALF TABLET
2.5000 mg | ORAL_TABLET | Freq: Every day | ORAL | Status: DC
  Administered 2023-07-15 – 2023-07-17 (×2): 2.5 mg via ORAL
  Filled 2023-07-14 (×4): qty 1

## 2023-07-14 MED ORDER — ACETAMINOPHEN 650 MG RE SUPP: 650.0000 mg | Freq: Four times a day (QID) | RECTAL | Status: DC | PRN

## 2023-07-14 MED ORDER — ONDANSETRON HCL 4 MG/2ML IJ SOLN: 4.0000 mg | Freq: Four times a day (QID) | INTRAMUSCULAR | Status: DC | PRN

## 2023-07-14 MED ORDER — HEPARIN (PORCINE) 25000 UT/250ML-% IV SOLN
1300.0000 [IU]/h | INTRAVENOUS | Status: DC
  Administered 2023-07-14: 750 [IU]/h via INTRAVENOUS
  Administered 2023-07-15: 1100 [IU]/h via INTRAVENOUS
  Administered 2023-07-16: 1300 [IU]/h via INTRAVENOUS
  Filled 2023-07-14 (×3): qty 250

## 2023-07-14 MED ORDER — AMLODIPINE BESYLATE 10 MG PO TABS
10.0000 mg | ORAL_TABLET | Freq: Every day | ORAL | Status: DC
  Administered 2023-07-15: 10 mg via ORAL
  Filled 2023-07-14: qty 1

## 2023-07-14 MED ORDER — POLYETHYLENE GLYCOL 3350 17 G PO PACK: 17.0000 g | PACK | Freq: Every day | ORAL | Status: DC | PRN

## 2023-07-14 MED ORDER — PANTOPRAZOLE SODIUM 40 MG PO TBEC
40.0000 mg | DELAYED_RELEASE_TABLET | Freq: Every day | ORAL | Status: DC
  Administered 2023-07-15 – 2023-07-17 (×2): 40 mg via ORAL
  Filled 2023-07-14 (×2): qty 1

## 2023-07-14 MED ORDER — ACETAMINOPHEN 500 MG PO TABS
1000.0000 mg | ORAL_TABLET | Freq: Once | ORAL | Status: AC
  Administered 2023-07-14: 1000 mg via ORAL
  Filled 2023-07-14: qty 2

## 2023-07-14 MED ORDER — OYSTER SHELL CALCIUM/D3 500-5 MG-MCG PO TABS
2.0000 | ORAL_TABLET | Freq: Every day | ORAL | Status: DC
Start: 2023-07-14 — End: 2023-07-17
  Administered 2023-07-14 – 2023-07-17 (×3): 2 via ORAL
  Filled 2023-07-14 (×3): qty 2

## 2023-07-14 MED ORDER — TRAZODONE HCL 50 MG PO TABS: 25.0000 mg | ORAL_TABLET | Freq: Every evening | ORAL | Status: DC | PRN

## 2023-07-14 MED ORDER — LOSARTAN POTASSIUM 50 MG PO TABS
100.0000 mg | ORAL_TABLET | Freq: Every day | ORAL | Status: DC
  Administered 2023-07-15: 100 mg via ORAL
  Filled 2023-07-14: qty 2

## 2023-07-14 MED ORDER — SENNOSIDES-DOCUSATE SODIUM 8.6-50 MG PO TABS
2.0000 | ORAL_TABLET | Freq: Every day | ORAL | Status: DC
  Administered 2023-07-15 – 2023-07-17 (×2): 2 via ORAL
  Filled 2023-07-14 (×2): qty 2

## 2023-07-14 MED ORDER — DEXTROSE 5 % IV SOLN
500.0000 mg | INTRAVENOUS | Status: AC
  Administered 2023-07-14 – 2023-07-16 (×3): 500 mg via INTRAVENOUS
  Filled 2023-07-14 (×3): qty 5

## 2023-07-14 MED ORDER — SODIUM CHLORIDE 0.9 % IV SOLN
2.0000 g | INTRAVENOUS | Status: DC
  Administered 2023-07-14 – 2023-07-16 (×3): 2 g via INTRAVENOUS
  Filled 2023-07-14 (×4): qty 20

## 2023-07-14 MED ORDER — NITROGLYCERIN 0.4 MG SL SUBL: 0.4000 mg | SUBLINGUAL_TABLET | SUBLINGUAL | Status: DC | PRN

## 2023-07-14 MED ORDER — LACTATED RINGERS IV SOLN
150.0000 mL/h | INTRAVENOUS | Status: DC
Start: 2023-07-14 — End: 2023-07-15
  Administered 2023-07-14 – 2023-07-15 (×2): 150 mL/h via INTRAVENOUS

## 2023-07-14 MED ORDER — METOPROLOL TARTRATE 5 MG/5ML IV SOLN
5.0000 mg | Freq: Once | INTRAVENOUS | Status: AC
Start: 2023-07-14 — End: 2023-07-14
  Administered 2023-07-14: 5 mg via INTRAVENOUS
  Filled 2023-07-14: qty 5

## 2023-07-14 NOTE — ED Triage Notes (Signed)
Called by PCP.  Patient c/o SOB.  Seen today and labs drawn.  Troponin elevated.  Sent to ED for Evaluation.

## 2023-07-14 NOTE — ED Notes (Signed)
Pt has been dealing with the SOB for about a year. Pt and pt family states that it happens with exertion and pt thought she is normal.

## 2023-07-14 NOTE — ED Triage Notes (Signed)
Pt referred to ED from [facility] for elevated troponin. Pt reports that she's been having palpitations and SOB intermittently for some time now.

## 2023-07-14 NOTE — Progress Notes (Signed)
CODE SEPSIS - PHARMACY COMMUNICATION  **Broad Spectrum Antibiotics should be administered within 1 hour of Sepsis diagnosis**  Time Code Sepsis Called/Page Received: 1850  Antibiotics Ordered: ceftriaxone and azithromycin  Time of 1st antibiotic administration: 2049  Additional action taken by pharmacy: Secure chat with RN, called RN, called charge RN  If necessary, Name of Provider/Nurse Contacted: Wyatt Mage, RN; Jeri Cos (charge RN)    Rockwell Alexandria ,PharmD Clinical Pharmacist  07/14/2023  8:50 PM

## 2023-07-14 NOTE — Sepsis Progress Note (Signed)
Elink monitoring for the code sepsis protocol.  

## 2023-07-14 NOTE — H&P (Incomplete)
Andrea Griffin   PATIENT NAME: Andrea Griffin    MR#:  [id number]  DATE OF BIRTH:  1932-07-31  DATE OF ADMISSION:  07/14/2023  PRIMARY CARE PHYSICIAN: Marisue Ivan, MD   Patient is coming from: Home  REQUESTING/REFERRING PHYSICIAN: Trinna Post, MD  CHIEF COMPLAINT:   Chief Complaint  Patient presents with   Palpitations   Shortness of Breath    HISTORY OF PRESENT ILLNESS:  Andrea Griffin is a 89 y.o. Caucasian female with medical history significant for hypertension, dyslipidemia, asthma, GERD, hyperthyroidism and anxiety, who presented to the emergency room with acute onset of palpitations with a feeling of skipped heart beats as well as mild sharp midsternal chest pain with no radiation.  She has been having dyspnea on exertion lately and dry cough without wheezing.  No fever or chills.  No nausea or vomiting or abdominal pain.  No bleeding diathesis.  She admits to dysuria without significant urinary frequency or urgency.  No fever or chills.  ED Course: When she came to the ER, heart rate was 126 with respiratory to 22 and temperature 97.4 with otherwise normal vital signs.  Labs revealed hyponatremia 127 and hypochloremia of 94 and LFTs were remarkable for albumin of 3.2.  High sensitive troponin I was 613 and later 605.  CBC showed mild anemia.  UA was positive for UTI. EKG as reviewed by me : EKG showed atrial fibrillation with rapid ventricular response of 119 with left anterior fascicular block and Q waves anteroseptally. Imaging: Portable chest ray showed no acute cardiopulmonary disease. Chest CTA revealed the following: 1. No pulmonary embolus. 2. Mild pulmonary fibrosis/interstitial lung disease with developing infection/inflammation of the left upper lobe-recommend follow-up CT in 3 months to evaluate for resolution. 3. Cardiomegaly. 4. Aortic Atherosclerosis (ICD10-I70.0) including left anterior descending and aortic valve leaflet calcifications-correlate  for aortic stenosis.  The patient was given 5 mg of IV Lopressor and IV heparin as well as 1 g of p.o. Tylenol.  The patient will be admitted to a progressive unit bed for further evaluation and management. PAST MEDICAL HISTORY:   Past Medical History:  Diagnosis Date   Anxiety    Arthritis    Asthma    Cancer (HCC)    melanoma skin cancer on head   GERD (gastroesophageal reflux disease)    History of recurrent cystitis    Hyperlipidemia    Hypertension    Infection of prosthetic hip joint (HCC)    Nocturia    Obesity    Osteopenia    Thyroid disease    Urinary frequency    Urinary incontinence    Urinary urgency    Wears glasses   -Hyperthyroidism  PAST SURGICAL HISTORY:   Past Surgical History:  Procedure Laterality Date   ABDOMINAL HYSTERECTOMY     BACK SURGERY  01/21/2016   lumbar fusion   bladder stem dilation     BREAST BIOPSY Right    x2   BREAST BIOPSY Right    x2   CHOLECYSTECTOMY     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 Rosita Kea   INCISION AND DRAINAGE HIP Right 09/15/2012   INCISION AND DRAINAGE HIP Right 2013   Dr. Rosita Kea   LUMBAR FUSION  01/21/2016   L4-L5 by Dr. Danielle Dess   MELANOMA EXCISION     skin cancer on top of head   OPEN REDUCTION  INTERNAL FIXATION (ORIF) DISTAL RADIAL FRACTURE Right 10/05/2019   Procedure: OPEN REDUCTION INTERNAL FIXATION (ORIF) DISTAL RADIAL FRACTURE;  Surgeon: Kennedy Bucker, MD;  Location: [facility] ORS;  Service: Orthopedics;  Laterality: Right;    SOCIAL HISTORY:   Social History   Tobacco Use   Smoking status: Never   Smokeless tobacco: Never  Substance Use Topics   Alcohol use: No    FAMILY HISTORY:   Family History  Problem Relation Age of Onset   Stroke Mother     DRUG ALLERGIES:   Allergies  Allergen Reactions   Ciprofloxacin Hives   Adhesive [Tape]     Pulls off skin   Nitrofurantoin Other (See  Comments)    Can not remember reaction   Phenazopyridine Other (See Comments)    Pt doesn't remember   Ranitidine Other (See Comments)    Can not remember reaction   Sulfa Antibiotics Other (See Comments)    Can not remember reaction    REVIEW OF SYSTEMS:   ROS As per history of present illness. All pertinent systems were reviewed above. Constitutional, HEENT, cardiovascular, respiratory, GI, GU, musculoskeletal, neuro, psychiatric, endocrine, integumentary and hematologic systems were reviewed and are otherwise negative/unremarkable except for positive findings mentioned above in the HPI.   MEDICATIONS AT HOME:   Prior to Admission medications   Medication Sig Start Date End Date Taking? Authorizing Provider  amLODipine (NORVASC) 10 MG tablet Take 10 mg by mouth daily.   Yes [provider]  amoxicillin (AMOXIL) 500 MG capsule Take 500 mg by mouth 2 (two) times daily.   Yes [provider]  atorvastatin (LIPITOR) 20 MG tablet Take 20 mg by mouth daily.   Yes [provider]  Calcium Carb-Cholecalciferol (CALCIUM 600+D) 600-800 MG-UNIT TABS Take 2 tablets by mouth daily.   Yes [provider]  fexofenadine (ALLEGRA) 180 MG tablet Take 180 mg by mouth daily.   Yes [provider]  fluticasone (FLONASE) 50 MCG/ACT nasal spray Place 2 sprays into both nostrils at bedtime.    Yes [provider]  Fluticasone-Salmeterol (ADVAIR) 100-50 MCG/DOSE AEPB Inhale 1 puff into the lungs 2 (two) times daily.   Yes [provider]  Lidocaine (BLUE-EMU PAIN RELIEF DRY EX) Apply 1 application topically daily as needed (pain).   Yes [provider]  losartan (COZAAR) 100 MG tablet Take 100 mg by mouth daily.   Yes [provider]  methimazole (TAPAZOLE) 5 MG tablet Take 2.5 mg by mouth daily. 03/25/23  Yes [provider]  metoprolol succinate (TOPROL-XL) 25 MG 24 hr tablet Take 1 tablet by mouth daily. 07/14/23  Yes  [provider]  omeprazole (PRILOSEC) 20 MG capsule Take 20 mg by mouth 2 (two) times daily.    Yes [provider]  senna-docusate (SENOKOT-S) 8.6-50 MG tablet Take 2 tablets by mouth daily.   Yes [provider]  acetaminophen (TYLENOL) 500 MG tablet Take 1,000 mg by mouth 2 (two) times daily.    [provider]  apixaban (ELIQUIS) 2.5 MG TABS tablet Take 2.5 mg by mouth 2 (two) times daily. 07/14/23   [provider]      VITAL SIGNS:  Blood pressure (!) 118/96, pulse 90, temperature 98.3 F (36.8 C), resp. rate 18, height 5\' 2"  (1.575 m), weight 69.3 kg, SpO2 98%.  PHYSICAL EXAMINATION:  Physical Exam  GENERAL:  89 y.o.-year-old, female patient lying in the bed with no acute distress.  EYES: Pupils equal, round, reactive to light and  accommodation. No scleral icterus. Extraocular muscles intact.  HEENT: Head atraumatic, normocephalic. Oropharynx and nasopharynx clear.  NECK:  Supple, no jugular venous distention. No thyroid enlargement, no tenderness.  LUNGS: Normal breath sounds bilaterally, no wheezing, rales,rhonchi or crepitation. No use of accessory muscles of respiration.  CARDIOVASCULAR: Regularly irregular mildly tachycardic rhythm, S1, S2 normal. No murmurs, rubs, or gallops.  ABDOMEN: Soft, nondistended, nontender. Bowel sounds present. No organomegaly or mass.  EXTREMITIES: No pedal edema, cyanosis, or clubbing.  NEUROLOGIC: Cranial nerves II through XII are intact. Muscle strength 5/5 in all extremities. Sensation intact. Gait not checked.  PSYCHIATRIC: The patient is alert and oriented x 3.  Normal affect and good eye contact. SKIN: No obvious rash, lesion, or ulcer.   LABORATORY PANEL:   CBC Recent Labs  Lab 07/14/23 1427  WBC 8.3  HGB 11.9*  HCT 35.3*  PLT 300   ------------------------------------------------------------------------------------------------------------------  Chemistries  Recent Labs  Lab  07/14/23 1156 07/14/23 1427  NA  --  127*  K  --  4.0  CL  --  94*  CO2  --  22  GLUCOSE  --  130*  BUN  --  14  CREATININE  --  0.55  CALCIUM  --  8.2*  MG 1.8  --   AST 39  --   ALT 34  --   ALKPHOS 110  --   BILITOT 0.7  --    ------------------------------------------------------------------------------------------------------------------  Cardiac Enzymes No results for input(s): "TROPONINI" in the last 168 hours. ------------------------------------------------------------------------------------------------------------------  RADIOLOGY:  CT Angio Chest PE W and/or Wo Contrast  Result Date: 07/14/2023 CLINICAL DATA:  Pulmonary embolism (PE) suspected, high prob. Patient c/o SOB. Seen today and labs drawn. Troponin elevated. EXAM: CT ANGIOGRAPHY CHEST WITH CONTRAST TECHNIQUE: Multidetector CT imaging of the chest was performed using the standard protocol during bolus administration of intravenous contrast. Multiplanar CT image reconstructions and MIPs were obtained to evaluate the vascular anatomy. RADIATION DOSE REDUCTION: This exam was performed according to the departmental dose-optimization program which includes automated exposure control, adjustment of the mA and/or kV according to patient size and/or use of iterative reconstruction technique. CONTRAST:  75mL OMNIPAQUE IOHEXOL 350 MG/ML SOLN FINDINGS: Cardiovascular: Satisfactory opacification of the pulmonary arteries to the segmental level. No evidence of pulmonary embolism. The main pulmonary artery is normal in caliber. Enlarged heart size. No significant pericardial effusion. The thoracic aorta is normal in caliber. No atherosclerotic plaque of the thoracic aorta. Left anterior descending coronary artery calcifications. Aortic valve leaflet calcification Mediastinum/Nodes: No enlarged mediastinal, hilar, or axillary lymph nodes. Thyroid gland, trachea, and esophagus demonstrate no significant findings. Lungs/Pleura: Chronic  peripheral reticulations in trace cystic changes most prominent along the left upper lobe. Interval development of superimposed patchy airspace opacity of left lower lobe (6:55). No pulmonary nodule. No pulmonary mass. No pleural effusion. No pneumothorax. Upper Abdomen: Mild atherosclerotic plaque. No acute abnormality with limited evaluation on this noncontrast study. Musculoskeletal: No chest wall abnormality. No suspicious lytic or blastic osseous lesions. No acute displaced fracture. Review of the MIP images confirms the above findings. IMPRESSION: 1. No pulmonary embolus. 2. Mild pulmonary fibrosis/interstitial lung disease with developing infection/inflammation of the left upper lobe-recommend follow-up CT in 3 months to evaluate for resolution. 3. Cardiomegaly. 4. Aortic Atherosclerosis (ICD10-I70.0) including left anterior descending and aortic valve leaflet calcifications-correlate for aortic stenosis. Electronically Signed   By: Tish Frederickson M.D.   On: 07/14/2023 18:13   DG Chest 2 View  Result Date: 07/14/2023 CLINICAL DATA:  Shortness of breath.  Elevated troponin. EXAM: CHEST - 2 VIEW COMPARISON:  Chest radiograph dated 05/04/2023 FINDINGS: There is mild diffuse chronic intra coarsening. No focal consolidation, pleural effusion, or pneumothorax. Top-normal cardiac size. Degenerative changes of the spine. No acute osseous pathology. IMPRESSION: No active cardiopulmonary disease. Electronically Signed   By: Elgie Collard M.D.   On: 07/14/2023 16:50      IMPRESSION AND PLAN:  Assessment and Plan: * NSTEMI (non-ST elevated myocardial infarction) (HCC) - The patient be admitted to a progressive unit bed. - We will continue IV heparin to replace Eliquis. - High-dose statin will be provided as well as beta-blocker therapy. - 2D echo and cardiology consult to be obtained. - Dr. Elwyn Lade was notified about the patient.  Paroxysmal atrial fibrillation with RVR (HCC) - This could be related  to #1 and possibly to sepsis. - We will continue hydration with IV normal saline as well as continue IV heparin as mentioned above. - We will continue beta-blocker therapy with Toprol-XL. - We will utilize IV Cardizem as needed.  Sepsis due to pneumonia (HCC) - Will continue antibiotic therapy with IV Rocephin and Zithromax. - Mucolytic therapy be provided. - We will follow blood cultures.   Acute lower UTI - The patient will be placed on IV Rocephin as mentioned above that should cover UTI. - We will follow blood and urine cultures.  Hyperthyroidism - We will continue methimazole. - Will check TSH level.  Essential hypertension - We will continue antihypertensive therapy.       DVT prophylaxis: IV heparin. Advanced Care Planning:  Code Status: The patient is DNR and DNI. Family Communication:  The plan of care was discussed in details with the patient (and family). I answered all questions. The patient agreed to proceed with the above mentioned plan. Further management will depend upon hospital course. Disposition Plan: Back to previous home environment Consults called: Cardiology All the records are reviewed and case discussed with ED provider.  Status is: Inpatient   At the time of the admission, it appears that the appropriate admission status for this patient is inpatient.  This is judged to be reasonable and necessary in order to provide the required intensity of service to ensure the patient's safety given the presenting symptoms, physical exam findings and initial radiographic and laboratory data in the context of comorbid conditions.  The patient requires inpatient status due to high intensity of service, high risk of further deterioration and high frequency of surveillance required.  I certify that at the time of admission, it is my clinical judgment that the patient will require inpatient hospital care extending more than 2 midnights.                             Dispo: The patient is from: Home              Anticipated d/c is to: Home              Patient currently is not medically stable to d/c.              Difficult to place patient: No  Hannah Beat M.D on 07/15/2023 at 3:42 AM  [facility]   From 7 PM-7 AM, contact night-coverage [email or web address]  CC: Primary care physician; Marisue Ivan, MD

## 2023-07-14 NOTE — Consult Note (Signed)
Pharmacy Consult Note - Anticoagulation  Pharmacy Consult for heparin Indication: chest pain/ACS  PATIENT MEASUREMENTS: Height: 5\' 2"  (157.5 cm) Weight: 71.2 kg (157 lb) IBW/kg (Calculated) : 50.1 HEPARIN DW (KG): 65.2  VITAL SIGNS: Temp: 97.4 F (36.3 C) (11/13 1415) Temp Source: Oral (11/13 1415) BP: 106/65 (11/13 1530) Pulse Rate: 103 (11/13 1530)  Recent Labs    07/14/23 1427  HGB 11.9*  HCT 35.3*  PLT 300  CREATININE 0.55  TROPONINIHS 605*    Estimated Creatinine Clearance: 44 mL/min (by C-G formula based on SCr of 0.55 mg/dL).  PAST MEDICAL HISTORY: Past Medical History:  Diagnosis Date   Anxiety    Arthritis    Asthma    Cancer (HCC)    melanoma skin cancer on head   GERD (gastroesophageal reflux disease)    History of recurrent cystitis    Hyperlipidemia    Hypertension    Infection of prosthetic hip joint (HCC)    Nocturia    Obesity    Osteopenia    Thyroid disease    Urinary frequency    Urinary incontinence    Urinary urgency    Wears glasses     ASSESSMENT: 89 y.o. female with PMH including Afib, HTN, asthma is presenting with shortness of breath which has been ongoing for a year. Patient is not on chronic anticoagulation per chart review. Recently was prescribed apixaban 2.5 mg twice daily for NOAF but patient has not started the medication yet. Will order baseline heparin level to look for false elevation (which would be possible if actually taking Eliquis PTA) just in case. Pharmacy has been consulted to initiate and manage heparin intravenous infusion.  Pertinent medications: Prescribed Eliquis 2.5 mg twice daily but reportedly has not picked up yet  Goal(s) of therapy: Heparin level 0.3 - 0.7 units/mL Monitor platelets by anticoagulation protocol: Yes   Baseline anticoagulation labs: Recent Labs    07/14/23 1427  HGB 11.9*  PLT 300  ^^ ordered a baseline heparin level, has not been processed  yet   Date Time aPTT/HL Rate/Comment    PLAN: Give 4000 units bolus x1; then start heparin infusion at 1500 units/hour. Check heparin level and aPTT in 8 hours, then daily once at least two levels are consecutively therapeutic. Monitor CBC daily while on heparin infusion.  Will M. Dareen Piano, PharmD Clinical Pharmacist 07/14/2023 3:57 PM

## 2023-07-14 NOTE — ED Notes (Signed)
Pt to CT

## 2023-07-14 NOTE — ED Provider Notes (Signed)
[facility] Provider Note    Event Date/Time   First MD Initiated Contact with Patient 07/14/23 1456     (approximate)   History   Palpitations and Shortness of Breath   HPI  Andrea Griffin is a 89 year old female with history of HTN, asthma presenting to the emergency department for evaluation of abnormal labs.  Patient was seen in her primary care office earlier today for routine checkup, there she was noted to have an irregular heartbeat with A-fib on EKG.  She was started on metoprolol and Eliquis with a referral to cardiology.  She did have a troponin sent which returned elevated at 613, so patient was directed to the ER.  She reports that for several months she has had some shortness of breath worse with walking.  Felt a funny feeling in her chest this morning not described as pain, thought initially may have been anxiety.  Does now report palpitations, unsure when they started.  Denies chest pain.  Does report dysuria for few days.    Physical Exam   Triage Vital Signs: ED Triage Vitals  Encounter Vitals Group     BP 07/14/23 1415 110/68     Systolic BP Percentile --      Diastolic BP Percentile --      Pulse Rate 07/14/23 1415 (!) 126     Resp 07/14/23 1415 (!) 22     Temp 07/14/23 1415 (!) 97.4 F (36.3 C)     Temp Source 07/14/23 1415 Oral     SpO2 07/14/23 1415 96 %     Weight 07/14/23 1503 157 lb (71.2 kg)     Height 07/14/23 1503 5\' 2"  (1.575 m)     Head Circumference --      Peak Flow --      Pain Score 07/14/23 1416 0     Pain Loc --      Pain Education --      Exclude from Growth Chart --     Most recent vital signs: Vitals:   07/14/23 2156 07/14/23 2330  BP: 134/82 (!) 118/96  Pulse: 84 90  Resp: 19 18  Temp: 98.9 F (37.2 C) 98.3 F (36.8 C)  SpO2: 96% 98%     General: Awake, interactive  CV:  Tachycardic with heart rates ranging from 100-110s while I was in the room, irregularly irregular, normal peripheral  perfusion Resp:  Lungs clear to auscultation, unlabored respirations.   Abd:  Nondistended.  Neuro:  Symmetric facial movement, fluid speech   ED Results / Procedures / Treatments   Labs (all labs ordered are listed, but only abnormal results are displayed) Labs Reviewed  BASIC METABOLIC PANEL - Abnormal; Notable for the following components:      Result Value   Sodium 127 (*)    Chloride 94 (*)    Glucose, Bld 130 (*)    Calcium 8.2 (*)    All other components within normal limits  CBC - Abnormal; Notable for the following components:   RBC 3.62 (*)    Hemoglobin 11.9 (*)    HCT 35.3 (*)    All other components within normal limits  URINALYSIS, W/ REFLEX TO CULTURE (INFECTION SUSPECTED) - Abnormal; Notable for the following components:   Color, Urine AMBER (*)    APPearance TURBID (*)    Protein, ur 100 (*)    Leukocytes,Ua LARGE (*)    Bacteria, UA RARE (*)    All other components within normal  limits  T4, FREE - Abnormal; Notable for the following components:   Free T4 1.16 (*)    All other components within normal limits  HEPATIC FUNCTION PANEL - Abnormal; Notable for the following components:   Albumin 3.2 (*)    Bilirubin, Direct 0.3 (*)    All other components within normal limits  HEPARIN LEVEL (UNFRACTIONATED) - Abnormal; Notable for the following components:   Heparin Unfractionated <0.10 (*)    All other components within normal limits  APTT - Abnormal; Notable for the following components:   aPTT 41 (*)    All other components within normal limits  TROPONIN I (HIGH SENSITIVITY) - Abnormal; Notable for the following components:   Troponin I (High Sensitivity) 605 (*)    All other components within normal limits  TROPONIN I (HIGH SENSITIVITY) - Abnormal; Notable for the following components:   Troponin I (High Sensitivity) 553 (*)    All other components within normal limits  URINE CULTURE  RESP PANEL BY RT-PCR (RSV, FLU A&B, COVID)  RVPGX2  CULTURE, BLOOD  (ROUTINE X 2)  CULTURE, BLOOD (ROUTINE X 2)  MRSA NEXT GEN BY PCR, NASAL  MAGNESIUM  TSH  PROTIME-INR  LACTIC ACID, PLASMA  APTT  HEPARIN LEVEL (UNFRACTIONATED)  CBC  PROTIME-INR  CORTISOL-AM, BLOOD  PROCALCITONIN  BASIC METABOLIC PANEL  LACTIC ACID, PLASMA     EKG EKG independently reviewed interpreted by myself (ER attending) demonstrates:  EKG demonstrates A-fib at a rate of 119, QRS 82, QTc 441, no acute ST changes  RADIOLOGY Imaging independently reviewed and interpreted by myself demonstrates:  CXR without focal consolidation CTA without PE  PROCEDURES:  Critical Care performed: Yes, see critical care procedure note(s)  CRITICAL CARE Performed by: Trinna Post   Total critical care time: 32 minutes  Critical care time was exclusive of separately billable procedures and treating other patients.  Critical care was necessary to treat or prevent imminent or life-threatening deterioration.  Critical care was time spent personally by me on the following activities: development of treatment plan with patient and/or surrogate as well as nursing, discussions with consultants, evaluation of patient's response to treatment, examination of patient, obtaining history from patient or surrogate, ordering and performing treatments and interventions, ordering and review of laboratory studies, ordering and review of radiographic studies, pulse oximetry and re-evaluation of patient's condition.   Procedures   MEDICATIONS ORDERED IN ED: Medications  heparin ADULT infusion 100 units/mL (25000 units/229mL) (750 Units/hr Intravenous New Bag/Given 07/14/23 1701)  cefTRIAXone (ROCEPHIN) 2 g in sodium chloride 0.9 % 100 mL IVPB (0 g Intravenous Stopped 07/14/23 2055)  azithromycin (ZITHROMAX) 500 mg in dextrose 5 % 250 mL IVPB (500 mg Intravenous New Bag/Given 07/14/23 2049)  amLODipine (NORVASC) tablet 10 mg (has no administration in time range)  atorvastatin (LIPITOR) tablet 20 mg (has  no administration in time range)  losartan (COZAAR) tablet 100 mg (has no administration in time range)  metoprolol succinate (TOPROL-XL) 24 hr tablet 25 mg (has no administration in time range)  methIMAzole (TAPAZOLE) tablet 2.5 mg (has no administration in time range)  pantoprazole (PROTONIX) EC tablet 40 mg (has no administration in time range)  senna-docusate (Senokot-S) tablet 2 tablet (has no administration in time range)  calcium-vitamin D (OSCAL WITH D) 500-5 MG-MCG per tablet 2 tablet (2 tablets Oral Given 07/14/23 2043)  lactated ringers infusion (150 mL/hr Intravenous New Bag/Given 07/14/23 2256)  acetaminophen (TYLENOL) tablet 650 mg (has no administration in time range)    Or  acetaminophen (TYLENOL) suppository 650 mg (has no administration in time range)  traZODone (DESYREL) tablet 25 mg (has no administration in time range)  polyethylene glycol (MIRALAX / GLYCOLAX) packet 17 g (has no administration in time range)  ondansetron (ZOFRAN) tablet 4 mg (has no administration in time range)    Or  ondansetron (ZOFRAN) injection 4 mg (has no administration in time range)  morphine (PF) 2 MG/ML injection 2 mg (has no administration in time range)  nitroGLYCERIN (NITROSTAT) SL tablet 0.4 mg (has no administration in time range)  heparin bolus via infusion 4,000 Units (4,000 Units Intravenous Bolus from Bag 07/14/23 1701)  iohexol (OMNIPAQUE) 350 MG/ML injection 75 mL (75 mLs Intravenous Contrast Given 07/14/23 1622)  acetaminophen (TYLENOL) tablet 1,000 mg (1,000 mg Oral Given 07/14/23 2029)  metoprolol tartrate (LOPRESSOR) injection 5 mg (5 mg Intravenous Given 07/14/23 2039)     IMPRESSION / MDM / ASSESSMENT AND PLAN / ED COURSE  I reviewed the triage vital signs and the nursing notes.  Differential diagnosis includes, but is not limited to, arrhythmia, ACS, PE, anemia, electrolyte abnormality  Patient's presentation is most consistent with acute presentation with potential  threat to life or bodily function.  89 year old female presenting with ongoing shortness of breath found to be in new onset A-fib with elevated troponin as an outpatient.  Tachycardic on presentation here, heart rates improved to the low 100s at the time of my initial evaluation.  BP stable.  Labs here with stable elevated troponin at 605.  Mild anemia noted.  Hyponatremia with sodium of 127.  Will add on hepatic functional panel, magnesium, thyroid studies.  Will also obtain CTA to evaluate for PE.  Ultimate plan for admission.  Heparin ordered in the setting of NSTEMI.  CTA resulted without evidence of PE.  Questionable area of infection on CT chest.  Urine is also concerning for infection with large leukocyte esterase and greater than 50 white blood cells.  While tachycardia and tachypnea could be related to A-fib, does meet sepsis criteria, so will initiate sepsis orders starting with 1 L of IV fluids and empiric Rocephin and azithromycin.  Patient updated on results of workup.  She is agreeable with plan for admission.  On reevaluation her heart rates are now ranging from 110s-140s.  With this, will give her a dose of IV metoprolol.  Will reach out to hospitalist team to discuss admission.  Reviewed with hospitalist team.  They will evaluate the patient for anticipated admission.  Did request that cardiology team be notified.  Dr. Elwyn Lade notified of patient, reports that cardiology team will evaluate in the morning.  Patient admitted for further management.      FINAL CLINICAL IMPRESSION(S) / ED DIAGNOSES   Final diagnoses:  Atrial fibrillation with RVR (HCC)  NSTEMI (non-ST elevated myocardial infarction) (HCC)     Rx / DC Orders   ED Discharge Orders     None        Note:  This document was prepared using Dragon voice recognition software and may include unintentional dictation errors.   Trinna Post, MD 07/15/23 [phone]

## 2023-07-15 ENCOUNTER — Encounter: Payer: Self-pay | Admitting: Family Medicine

## 2023-07-15 ENCOUNTER — Other Ambulatory Visit (HOSPITAL_COMMUNITY): Payer: Self-pay

## 2023-07-15 DIAGNOSIS — A419 Sepsis, unspecified organism: Secondary | ICD-10-CM

## 2023-07-15 DIAGNOSIS — N390 Urinary tract infection, site not specified: Secondary | ICD-10-CM

## 2023-07-15 DIAGNOSIS — I214 Non-ST elevation (NSTEMI) myocardial infarction: Secondary | ICD-10-CM

## 2023-07-15 DIAGNOSIS — I4891 Unspecified atrial fibrillation: Principal | ICD-10-CM

## 2023-07-15 LAB — CBC
HCT: 31.6 % — ABNORMAL LOW (ref 36.0–46.0)
Hemoglobin: 11 g/dL — ABNORMAL LOW (ref 12.0–15.0)
MCH: 32.4 pg (ref 26.0–34.0)
MCHC: 34.8 g/dL (ref 30.0–36.0)
MCV: 92.9 fL (ref 80.0–100.0)
Platelets: 277 10*3/uL (ref 150–400)
RBC: 3.4 MIL/uL — ABNORMAL LOW (ref 3.87–5.11)
RDW: 12.3 % (ref 11.5–15.5)
WBC: 7.8 10*3/uL (ref 4.0–10.5)
nRBC: 0 % (ref 0.0–0.2)

## 2023-07-15 LAB — BASIC METABOLIC PANEL
Anion gap: 12 (ref 5–15)
BUN: 9 mg/dL (ref 8–23)
CO2: 22 mmol/L (ref 22–32)
Calcium: 8.2 mg/dL — ABNORMAL LOW (ref 8.9–10.3)
Chloride: 95 mmol/L — ABNORMAL LOW (ref 98–111)
Creatinine, Ser: <0.3 mg/dL — ABNORMAL LOW (ref 0.44–1.00)
Glucose, Bld: 91 mg/dL (ref 70–99)
Potassium: 3.3 mmol/L — ABNORMAL LOW (ref 3.5–5.1)
Sodium: 129 mmol/L — ABNORMAL LOW (ref 135–145)

## 2023-07-15 LAB — CORTISOL-AM, BLOOD: Cortisol - AM: 8.1 ug/dL (ref 6.7–22.6)

## 2023-07-15 LAB — PROCALCITONIN: Procalcitonin: 3.8 ng/mL

## 2023-07-15 LAB — RESP PANEL BY RT-PCR (RSV, FLU A&B, COVID)  RVPGX2
Influenza A by PCR: NEGATIVE
Influenza B by PCR: NEGATIVE
Resp Syncytial Virus by PCR: NEGATIVE
SARS Coronavirus 2 by RT PCR: NEGATIVE

## 2023-07-15 LAB — PROTIME-INR
INR: 1.2 (ref 0.8–1.2)
Prothrombin Time: 15 s (ref 11.4–15.2)

## 2023-07-15 LAB — MRSA NEXT GEN BY PCR, NASAL: MRSA by PCR Next Gen: NOT DETECTED

## 2023-07-15 LAB — LACTIC ACID, PLASMA: Lactic Acid, Venous: 1 mmol/L (ref 0.5–1.9)

## 2023-07-15 LAB — HEPARIN LEVEL (UNFRACTIONATED)
Heparin Unfractionated: 0.13 [IU]/mL — ABNORMAL LOW (ref 0.30–0.70)
Heparin Unfractionated: 0.21 [IU]/mL — ABNORMAL LOW (ref 0.30–0.70)
Heparin Unfractionated: 0.23 [IU]/mL — ABNORMAL LOW (ref 0.30–0.70)

## 2023-07-15 LAB — APTT: aPTT: 75 s — ABNORMAL HIGH (ref 24–36)

## 2023-07-15 MED ORDER — METOPROLOL SUCCINATE ER 50 MG PO TB24: 50.0000 mg | ORAL_TABLET | Freq: Every day | ORAL | Status: DC

## 2023-07-15 MED ORDER — POTASSIUM CHLORIDE CRYS ER 20 MEQ PO TBCR
40.0000 meq | EXTENDED_RELEASE_TABLET | ORAL | Status: AC
  Administered 2023-07-15 (×2): 40 meq via ORAL
  Filled 2023-07-15 (×2): qty 2

## 2023-07-15 MED ORDER — HEPARIN BOLUS VIA INFUSION
950.0000 [IU] | Freq: Once | INTRAVENOUS | Status: AC
  Administered 2023-07-15: 950 [IU] via INTRAVENOUS
  Filled 2023-07-15: qty 950

## 2023-07-15 MED ORDER — HEPARIN BOLUS VIA INFUSION
1000.0000 [IU] | Freq: Once | INTRAVENOUS | Status: AC
Start: 2023-07-15 — End: 2023-07-15
  Administered 2023-07-15: 1000 [IU] via INTRAVENOUS
  Filled 2023-07-15: qty 1000

## 2023-07-15 MED ORDER — HEPARIN BOLUS VIA INFUSION
2000.0000 [IU] | Freq: Once | INTRAVENOUS | Status: AC
  Administered 2023-07-15: 2000 [IU] via INTRAVENOUS
  Filled 2023-07-15: qty 2000

## 2023-07-15 MED ORDER — ASPIRIN 81 MG PO CHEW
81.0000 mg | CHEWABLE_TABLET | Freq: Every day | ORAL | Status: DC
  Administered 2023-07-15 – 2023-07-17 (×2): 81 mg via ORAL
  Filled 2023-07-15 (×2): qty 1

## 2023-07-15 MED ORDER — ATORVASTATIN CALCIUM 80 MG PO TABS
80.0000 mg | ORAL_TABLET | Freq: Every day | ORAL | Status: DC
  Administered 2023-07-15: 80 mg via ORAL
  Filled 2023-07-15: qty 1

## 2023-07-15 MED ORDER — METOPROLOL SUCCINATE ER 25 MG PO TB24
25.0000 mg | ORAL_TABLET | Freq: Once | ORAL | Status: AC
  Administered 2023-07-15: 25 mg via ORAL
  Filled 2023-07-15: qty 1

## 2023-07-15 NOTE — Assessment & Plan Note (Addendum)
-   We will continue methimazole. - Will check TSH level.

## 2023-07-15 NOTE — Consult Note (Signed)
Pharmacy Consult Note - Anticoagulation  Pharmacy Consult for heparin Indication: chest pain/ACS  PATIENT MEASUREMENTS: Height: 5\' 2"  (157.5 cm) Weight: 69.3 kg (152 lb 11.2 oz) IBW/kg (Calculated) : 50.1 HEPARIN DW (KG): 65.2  VITAL SIGNS: Temp: 98.1 F (36.7 C) (11/14 1225) BP: 136/116 (11/14 1435) Pulse Rate: 67 (11/14 1225)  Recent Labs    07/14/23 1650 07/14/23 1653 07/15/23 0026 07/15/23 0608 07/15/23 0941  HGB  --   --   --  11.0*  --   HCT  --   --   --  31.6*  --   PLT  --   --   --  277  --   APTT  --    < > 75*  --   --   LABPROT  --    < >  --  15.0  --   INR  --    < >  --  1.2  --   HEPARINUNFRC  --    < > 0.13*  --  0.21*  CREATININE  --   --   --  <0.30*  --   TROPONINIHS 553*  --   --   --   --    < > = values in this interval not displayed.    CrCl cannot be calculated (This lab value cannot be used to calculate CrCl because it is not a number: <0.30).  PAST MEDICAL HISTORY: Past Medical History:  Diagnosis Date   Anxiety    Arthritis    Asthma    Cancer (HCC)    melanoma skin cancer on head   Diastolic dysfunction    a. 05/2023 Echo: EF 70-76^, no rwma, GrII DD, nl RV fxn, mildly dil RA, mod dil LA. Mild MR. Mod AI.   GERD (gastroesophageal reflux disease)    History of recurrent cystitis    Hyperlipidemia    Hypertension    Infection of prosthetic hip joint (HCC)    Mitral regurgitation    Moderate aortic insufficiency    Nocturia    Obesity    Osteopenia    Persistent atrial fibrillation (HCC)    Thyroid disease    Urinary frequency    Urinary incontinence    Urinary urgency    Wears glasses     ASSESSMENT: 89 y.o. female with PMH including Afib, HTN, asthma is presenting with shortness of breath which has been ongoing for a year. Patient is not on chronic anticoagulation per chart review. Recently was prescribed apixaban 2.5 mg twice daily for NOAF but patient has not started the medication yet. Pharmacy has been consulted to  initiate and manage heparin intravenous infusion.  Pertinent medications: Prescribed Eliquis 2.5 mg twice daily but reportedly has not picked up yet  Goal(s) of therapy: Heparin level 0.3 - 0.7 units/mL Monitor platelets by anticoagulation protocol: Yes   Baseline anticoagulation labs: Recent Labs    07/14/23 1427 07/14/23 1653 07/15/23 0026 07/15/23 0608  APTT  --  41* 75*  --   INR  --  1.2  --  1.2  HGB 11.9*  --   --  11.0*  PLT 300  --   --  277    PLAN: Give units bolus 1000 x 1 Increase heparin infusion to 1300 units/hour Recheck heparin level in 8 hours after rate change Monitor CBC daily while on heparin infusion.  Lowella Bandy, PharmD, BCPS 07/15/2023 3:11 PM

## 2023-07-15 NOTE — Hospital Course (Addendum)
HPI: Andrea Griffin is a 89 y.o. Caucasian female with medical history significant for hypertension, dyslipidemia, asthma, GERD, hyperthyroidism and anxiety, who presented to the emergency room 07/14/23 with acute onset of palpitations with a feeling of skipped heart beats as well as mild sharp midsternal chest pain with no radiation.  She has been having dyspnea on exertion lately and dry cough without wheezing.   Of note, was in PCP office earlier 07/14/23 for annual - new dx Afib by EKG w/ rate 119, started on metoprolol succinate 25 mg daily and eliquis 2.5 mg bid, later same day presented to ED as above.   Of note, was hospitalized 05/04/23-05/07/23 for hyponatremia, echo done to evaluate cardiomegaly, 05/12/23 f/u w/ PCP. Hyponatremia has been chronic/recurring, has responded to fluid restriction in the past    Hospital course / significant events:  11/13: to ED, admitted to hospitalist service for sepsis d/t UTI and questionable pneumonia/CAP, NSTEMI, hyponatremia 11/14: cardiology saw pt, adjusting meds and getting echocardiogram, consideration for cath vs noninvasive testing 11/15: cardiac cath pending for today. Echocardiogram EF 70-75, G2DD, LA moderate dilation  Consultants:  Cardiology   Procedures/Surgeries: none      ASSESSMENT & PLAN:   NSTEMI (non-ST elevated myocardial infarction)  Heparin infusion, holding home eliquis ASA, statin, beta blocker Cardiology following  Cardiac cath pending   Paroxysmal atrial fibrillation with RVR  Attributed to NSTEMI and/or to sepsis. Treat underlying causes Heparin infusion for NSTEMI will serve as anticoagulation, holding home eliquis continue beta-blocker therapy with Toprol-XL. IV Cardizem as needed. Cardiology following Cardiac cath pending    Possible sepsis due to UTI (supported by UA, as well as symptoms), less likely CAP (supported by CT chest findings and procalcitonin elevation but she is not having  cough) Possible SIRS d/t Afib/NSTEMI  BCx NGx2d  IV fluids d/c   IV Rocephin and Zithromax. Follow urine and blood cultures --> pending   Hyponatremia, mild  Likely hypovolemic given sepsis  IV fluids d/c Trend BMP --> improving   Hyperthyroidism continue methimazole. check TSH --> WNL, and T4 very slightly above normal Follow outpatient    Essential hypertension do not administer metoprolol if BP <110/70 or if HR <50 Holding home amlodipine and losartan pending rate control w/ beta blocker         overweight based on BMI: Body mass index is 27.93 kg/m.  Underweight - under 18.5  normal weight - 18.5 to 24.9 overweight - 25 to 29.9 obese - 30 or more   DVT prophylaxis: heparin IV fluids: no continuous IV fluids  Nutrition: cardiac diet Central lines / invasive devices: none  Code Status: DNR ACP documentation reviewed: 07/15/23 and has HCPOA and LW on file in [facility]  TOC needs: none  Barriers to dispo / significant pending items: echo, clinical improvement, remain on heparin 48h / per cardiology

## 2023-07-15 NOTE — Consult Note (Signed)
PHARMACY CONSULT NOTE - ELECTROLYTES  Pharmacy Consult for Electrolyte Monitoring and Replacement   Recent Labs: Height: 5\' 2"  (157.5 cm) Weight: 69.3 kg (152 lb 11.2 oz) IBW/kg (Calculated) : 50.1 CrCl cannot be calculated (This lab value cannot be used to calculate CrCl because it is not a number: <0.30). Potassium (mmol/L)  Date Value  07/15/2023 3.3 (L)  03/28/2014 4.2   Magnesium (mg/dL)  Date Value  16/06/9603 1.8  05/03/2012 1.3 (L)   Calcium (mg/dL)  Date Value  54/05/8118 8.2 (L)   Calcium, Total (mg/dL)  Date Value  14/78/2956 7.8 (L)   Albumin (g/dL)  Date Value  21/30/8657 3.2 (L)  10/16/2012 3.0 (L)   Phosphorus (mg/dL)  Date Value  84/69/6295 4.0   Sodium (mmol/L)  Date Value  07/15/2023 129 (L)  03/28/2014 137   Assessment  Andrea Griffin is a 89 y.o. female presenting with palpitations and chest pain. PMH significant for hypertension, dyslipidemia, asthma, GERD, hyperthyroidism and anxiety . Pharmacy has been consulted to monitor and replace electrolytes.  Diet: NPO MIVF: IV lock Pertinent medications: n/a  Goal of Therapy: Electrolytes WNL  Plan:  K 3.3, Will optimize to 4 due to admission with "palpitation" . Kcl po q 2hrs x 2 doses ordered this AM. Check BMP, Mg, Phos with AM labs  Thank you for allowing pharmacy to be a part of this patient's care.  Andrea Griffin PharmD, BCPS 07/15/2023 8:22 AM

## 2023-07-15 NOTE — Consult Note (Addendum)
Pharmacy Consult Note - Anticoagulation  Pharmacy Consult for heparin Indication: chest pain/ACS  PATIENT MEASUREMENTS: Height: 5\' 2"  (157.5 cm) Weight: 69.3 kg (152 lb 11.2 oz) IBW/kg (Calculated) : 50.1 HEPARIN DW (KG): 65.2  VITAL SIGNS: Temp: 98.4 F (36.9 C) (11/14 0819) BP: 139/72 (11/14 0819) Pulse Rate: 118 (11/14 0819)  Recent Labs    07/14/23 1650 07/14/23 1653 07/15/23 0026 07/15/23 0608 07/15/23 0941  HGB  --   --   --  11.0*  --   HCT  --   --   --  31.6*  --   PLT  --   --   --  277  --   APTT  --    < > 75*  --   --   LABPROT  --    < >  --  15.0  --   INR  --    < >  --  1.2  --   HEPARINUNFRC  --    < > 0.13*  --  0.21*  CREATININE  --   --   --  <0.30*  --   TROPONINIHS 553*  --   --   --   --    < > = values in this interval not displayed.    CrCl cannot be calculated (This lab value cannot be used to calculate CrCl because it is not a number: <0.30).  PAST MEDICAL HISTORY: Past Medical History:  Diagnosis Date   Anxiety    Arthritis    Asthma    Cancer (HCC)    melanoma skin cancer on head   GERD (gastroesophageal reflux disease)    History of recurrent cystitis    Hyperlipidemia    Hypertension    Infection of prosthetic hip joint (HCC)    Nocturia    Obesity    Osteopenia    Thyroid disease    Urinary frequency    Urinary incontinence    Urinary urgency    Wears glasses     ASSESSMENT: 89 y.o. female with PMH including Afib, HTN, asthma is presenting with shortness of breath which has been ongoing for a year. Patient is not on chronic anticoagulation per chart review. Recently was prescribed apixaban 2.5 mg twice daily for NOAF but patient has not started the medication yet. Will order baseline heparin level to look for false elevation (which would be possible if actually taking Eliquis PTA) just in case. Pharmacy has been consulted to initiate and manage heparin intravenous infusion.  Pertinent medications: Prescribed Eliquis 2.5  mg twice daily but reportedly has not picked up yet  Goal(s) of therapy: Heparin level 0.3 - 0.7 units/mL Monitor platelets by anticoagulation protocol: Yes   Baseline anticoagulation labs: Recent Labs    07/14/23 1427 07/14/23 1653 07/15/23 0026 07/15/23 0608  APTT  --  41* 75*  --   INR  --  1.2  --  1.2  HGB 11.9*  --   --  11.0*  PLT 300  --   --  277   Date Time aPTT/HL Rate/Comment 11/14 0026 HL 0.13 Subtherapeutic 11/14 0930 HL 0.21 Subtherapeutic  PLAN: Give units bolus 950 x 1 Increase heparin infusion to 1100 units/hour from 950 units/hour. Recheck heparin level in 8 hours after rate change Monitor CBC daily while on heparin infusion.  Effie Shy, PharmD Pharmacy Resident  07/15/2023 10:17 AM

## 2023-07-15 NOTE — Assessment & Plan Note (Signed)
-   The patient will be placed on IV Rocephin as mentioned above that should cover UTI. - We will follow blood and urine cultures.

## 2023-07-15 NOTE — Progress Notes (Signed)
PROGRESS NOTE    Andrea Griffin   ZOX:[id number] DOB: 12-25-1931  DOA: 07/14/2023 Date of Service: 07/15/23 which is hospital day 1  PCP: Marisue Ivan, MD    HPI: Andrea Griffin is a 89 y.o. Caucasian female with medical history significant for hypertension, dyslipidemia, asthma, GERD, hyperthyroidism and anxiety, who presented to the emergency room 07/14/23 with acute onset of palpitations with a feeling of skipped heart beats as well as mild sharp midsternal chest pain with no radiation.  She has been having dyspnea on exertion lately and dry cough without wheezing.   Of note, was in PCP office earlier 07/14/23 for annual - new dx Afib by EKG w/ rate 119, started on metoprolol succinate 25 mg daily and eliquis 2.5 mg bid, later same day presented to ED as above.   Of note, was hospitalized 05/04/23-05/07/23 for hyponatremia, echo done to evaluate cardiomegaly, 05/12/23 f/u w/ PCP. Hyponatremia has been chronic/recurring, has responded to fluid restriction in the past    Hospital course / significant events:  11/13: to ED, admitted to hospitalist service for sepsis d/t UTI and questionable pneumonia/CAP, NSTEMI, hyponatremia 11/14: cardiology saw pt, adjusting meds and getting echocardiogram   Consultants:  Cardiology   Procedures/Surgeries: none      ASSESSMENT & PLAN:   NSTEMI (non-ST elevated myocardial infarction)  Heparin infusion, holding home eliquis ASA, statin, beta blocker, ARB Cardiology consult  Echocardiogram  Paroxysmal atrial fibrillation with RVR  Attributed to NSTEMI and/or to sepsis. Treat underlying causes Heparin infusion for NSTEMI will serve as anticoagulation, holding home eliquis continue beta-blocker therapy with Toprol-XL. IV Cardizem as needed.   Possible sepsis due to UTI (supported by UA, as well as symptoms), CAP (supported by CT chest findings and procalcitonin elevation but she is not having cough) Possible SIRS d/t Afib/NSTEMI   IV fluids   IV Rocephin and Zithromax. Follow urine and blood cultures --> pending   Hyponatremia, mild  Likely hypovolemic given sepsis  IV fluids Trend BMP --> improving   Hyperthyroidism continue methimazole. check TSH --> WNL, and T4 very slightly above normal Follow outpatient    Essential hypertension Holding parameters on antihypertensive therapy - do not administer losartan if BP <110/70, do not administer metoprolol if BP <110/70 or if HR <50 Holding home amlodipine         overweight based on BMI: Body mass index is 27.93 kg/m.  Underweight - under 18.5  normal weight - 18.5 to 24.9 overweight - 25 to 29.9 obese - 30 or more   DVT prophylaxis: heparin IV fluids: no continuous IV fluids  Nutrition: cardiac diet Central lines / invasive devices: none  Code Status: DNR ACP documentation reviewed: 07/15/23 and has HCPOA and LW on file in [facility]  TOC needs: none  Barriers to dispo / significant pending items: echo, clinical improvement, remain on heparin 48h / per cardiology             Subjective / Brief ROS:  Patient reports doing better today, still some chest tightness and palpitations  Denies CP/SOB.  Pain controlled.  Denies new weakness.  Tolerating diet .  Reports no concerns w/ urination/defecation.   Family Communication: husband at bedside on rounds     Objective Findings:  Vitals:   07/15/23 0819 07/15/23 0905 07/15/23 1225 07/15/23 1435  BP: 139/72  (!) 85/55 (!) 136/116  Pulse: (!) 118  67   Resp:  19    Temp: 98.4 F (36.9 C)  98.1 F (36.7  C)   TempSrc:      SpO2: 97%  95%   Weight:      Height:        Intake/Output Summary (Last 24 hours) at 07/15/2023 1502 Last data filed at 07/15/2023 7425 Gross per 24 hour  Intake 1430.08 ml  Output 500 ml  Net 930.08 ml   Filed Weights   07/14/23 1503 07/14/23 2156  Weight: 71.2 kg 69.3 kg    Examination:  Physical Exam Constitutional:      General: She is not in  acute distress. Cardiovascular:     Rate and Rhythm: Normal rate. Rhythm irregular.  Pulmonary:     Effort: Pulmonary effort is normal.     Breath sounds: Normal breath sounds.  Musculoskeletal:     Right lower leg: No edema.     Left lower leg: No edema.  Neurological:     Mental Status: She is alert and oriented to person, place, and time.  Psychiatric:        Mood and Affect: Mood normal.        Behavior: Behavior normal.          Scheduled Medications:   atorvastatin  80 mg Oral Daily   calcium-vitamin D  2 tablet Oral Daily   methimazole  2.5 mg Oral Daily   [START ON 07/16/2023] metoprolol succinate  50 mg Oral Daily   pantoprazole  40 mg Oral Daily   senna-docusate  2 tablet Oral Daily    Continuous Infusions:  azithromycin 500 mg (07/14/23 2049)   cefTRIAXone (ROCEPHIN)  IV Stopped (07/14/23 2055)   heparin 1,100 Units/hr (07/15/23 1048)    PRN Medications:  acetaminophen **OR** acetaminophen, morphine injection, nitroGLYCERIN, ondansetron **OR** ondansetron (ZOFRAN) IV, polyethylene glycol, traZODone  Antimicrobials from admission:  Anti-infectives (From admission, onward)    Start     Dose/Rate Route Frequency Ordered Stop   07/14/23 2015  cefTRIAXone (ROCEPHIN) 2 g in sodium chloride 0.9 % 100 mL IVPB  Status:  Discontinued        2 g 200 mL/hr over 30 Minutes Intravenous Every 24 hours 07/14/23 2003 07/14/23 2005   07/14/23 2015  azithromycin (ZITHROMAX) 500 mg in dextrose 5 % 250 mL IVPB  Status:  Discontinued        500 mg 250 mL/hr over 60 Minutes Intravenous Every 24 hours 07/14/23 2003 07/14/23 2005   07/14/23 1900  cefTRIAXone (ROCEPHIN) 2 g in sodium chloride 0.9 % 100 mL IVPB        2 g 200 mL/hr over 30 Minutes Intravenous Every 24 hours 07/14/23 1850 07/19/23 1859   07/14/23 1900  azithromycin (ZITHROMAX) 500 mg in dextrose 5 % 250 mL IVPB        500 mg 250 mL/hr over 60 Minutes Intravenous Every 24 hours 07/14/23 1850 07/19/23 1859            Data Reviewed:  I have personally reviewed the following...  CBC: Recent Labs  Lab 07/14/23 1427 07/15/23 0608  WBC 8.3 7.8  HGB 11.9* 11.0*  HCT 35.3* 31.6*  MCV 97.5 92.9  PLT 300 277   Basic Metabolic Panel: Recent Labs  Lab 07/14/23 1156 07/14/23 1427 07/15/23 0608  NA  --  127* 129*  K  --  4.0 3.3*  CL  --  94* 95*  CO2  --  22 22  GLUCOSE  --  130* 91  BUN  --  14 9  CREATININE  --  0.55 <0.30*  CALCIUM  --  8.2* 8.2*  MG 1.8  --   --    GFR: CrCl cannot be calculated (This lab value cannot be used to calculate CrCl because it is not a number: <0.30). Liver Function Tests: Recent Labs  Lab 07/14/23 1156  AST 39  ALT 34  ALKPHOS 110  BILITOT 0.7  PROT 6.7  ALBUMIN 3.2*   No results for input(s): "LIPASE", "AMYLASE" in the last 168 hours. No results for input(s): "AMMONIA" in the last 168 hours. Coagulation Profile: Recent Labs  Lab 07/14/23 1653 07/15/23 0608  INR 1.2 1.2   Cardiac Enzymes: No results for input(s): "CKTOTAL", "CKMB", "CKMBINDEX", "TROPONINI" in the last 168 hours. BNP (last 3 results) No results for input(s): "PROBNP" in the last 8760 hours. HbA1C: No results for input(s): "HGBA1C" in the last 72 hours. CBG: No results for input(s): "GLUCAP" in the last 168 hours. Lipid Profile: No results for input(s): "CHOL", "HDL", "LDLCALC", "TRIG", "CHOLHDL", "LDLDIRECT" in the last 72 hours. Thyroid Function Tests: Recent Labs    07/14/23 1156  TSH 1.468  FREET4 1.16*   Anemia Panel: No results for input(s): "VITAMINB12", "FOLATE", "FERRITIN", "TIBC", "IRON", "RETICCTPCT" in the last 72 hours. Most Recent Urinalysis On File:     Component Value Date/Time   COLORURINE AMBER (A) 07/14/2023 1519   APPEARANCEUR TURBID (A) 07/14/2023 1519   APPEARANCEUR Clear 03/13/2014 1403   LABSPEC 1.018 07/14/2023 1519   LABSPEC 1.030 03/13/2014 1403   PHURINE 5.0 07/14/2023 1519   GLUCOSEU NEGATIVE 07/14/2023 1519   GLUCOSEU  Negative 03/13/2014 1403   HGBUR NEGATIVE 07/14/2023 1519   BILIRUBINUR NEGATIVE 07/14/2023 1519   BILIRUBINUR Negative 03/13/2014 1403   KETONESUR NEGATIVE 07/14/2023 1519   PROTEINUR 100 (A) 07/14/2023 1519   NITRITE NEGATIVE 07/14/2023 1519   LEUKOCYTESUR LARGE (A) 07/14/2023 1519   LEUKOCYTESUR Negative 03/13/2014 1403   Sepsis Labs: @LABRCNTIP (procalcitonin:4,lacticidven:4) Microbiology: Recent Results (from the past 240 hour(s))  Blood Culture (routine x 2)     Status: None (Preliminary result)   Collection Time: 07/14/23 10:50 PM   Specimen: BLOOD  Result Value Ref Range Status   Specimen Description BLOOD BLOOD LEFT HAND  Final   Special Requests   Final    BOTTLES DRAWN AEROBIC AND ANAEROBIC Blood Culture results may not be optimal due to an excessive volume of blood received in culture bottles   Culture   Final    NO GROWTH < 12 HOURS Performed at [facility], [address] [place], [place] [address]    Report Status PENDING  Incomplete  Blood Culture (routine x 2)     Status: None (Preliminary result)   Collection Time: 07/14/23 10:50 PM   Specimen: BLOOD  Result Value Ref Range Status   Specimen Description BLOOD BLOOD RIGHT HAND  Final   Special Requests   Final    BOTTLES DRAWN AEROBIC AND ANAEROBIC Blood Culture adequate volume   Culture   Final    NO GROWTH < 12 HOURS Performed at [facility], [address] [place], [place] [address]    Report Status PENDING  Incomplete  Resp panel by RT-PCR (RSV, Flu A&B, Covid) Nasal Mucosa     Status: None   Collection Time: 07/15/23 12:08 AM   Specimen: Nasal Mucosa; Nasal Swab  Result Value Ref Range Status   SARS Coronavirus 2 by RT PCR NEGATIVE NEGATIVE Final    Comment: (NOTE) SARS-CoV-2 target nucleic acids are NOT DETECTED.  The SARS-CoV-2 RNA is  generally detectable in upper respiratory specimens during the acute phase of infection. The lowest concentration of SARS-CoV-2 viral copies  this assay can detect is 138 copies/mL. A negative result does not preclude SARS-Cov-2 infection and should not be used as the sole basis for treatment or other patient management decisions. A negative result may occur with  improper specimen collection/handling, submission of specimen other than nasopharyngeal swab, presence of viral mutation(s) within the areas targeted by this assay, and inadequate number of viral copies(<138 copies/mL). A negative result must be combined with clinical observations, patient history, and epidemiological information. The expected result is Negative.  Fact Sheet for Patients:  BloggerCourse.com  Fact Sheet for Healthcare Providers:  SeriousBroker.it  This test is no t yet approved or cleared by the [place] FDA and  has been authorized for detection and/or diagnosis of SARS-CoV-2 by FDA under an Emergency Use Authorization (EUA). This EUA will remain  in effect (meaning this test can be used) for the duration of the COVID-19 declaration under Section 564(b)(1) of the Act, 21 U.S.C.section 360bbb-3(b)(1), unless the authorization is terminated  or revoked sooner.       Influenza A by PCR NEGATIVE NEGATIVE Final   Influenza B by PCR NEGATIVE NEGATIVE Final    Comment: (NOTE) The Xpert Xpress SARS-CoV-2/FLU/RSV plus assay is intended as an aid in the diagnosis of influenza from Nasopharyngeal swab specimens and should not be used as a sole basis for treatment. Nasal washings and aspirates are unacceptable for Xpert Xpress SARS-CoV-2/FLU/RSV testing.  Fact Sheet for Patients: BloggerCourse.com  Fact Sheet for Healthcare Providers: SeriousBroker.it  This test is not yet approved or cleared by the [place] FDA and has been authorized for detection and/or diagnosis of SARS-CoV-2 by FDA under an Emergency Use Authorization (EUA). This EUA  will remain in effect (meaning this test can be used) for the duration of the COVID-19 declaration under Section 564(b)(1) of the Act, 21 U.S.C. section 360bbb-3(b)(1), unless the authorization is terminated or revoked.     Resp Syncytial Virus by PCR NEGATIVE NEGATIVE Final    Comment: (NOTE) Fact Sheet for Patients: BloggerCourse.com  Fact Sheet for Healthcare Providers: SeriousBroker.it  This test is not yet approved or cleared by the [place] FDA and has been authorized for detection and/or diagnosis of SARS-CoV-2 by FDA under an Emergency Use Authorization (EUA). This EUA will remain in effect (meaning this test can be used) for the duration of the COVID-19 declaration under Section 564(b)(1) of the Act, 21 U.S.C. section 360bbb-3(b)(1), unless the authorization is terminated or revoked.  Performed at [facility], [address] [place], [place] [address]   MRSA Next Gen by PCR, Nasal     Status: None   Collection Time: 07/15/23 12:08 AM   Specimen: Nasal Mucosa; Nasal Swab  Result Value Ref Range Status   MRSA by PCR Next Gen NOT DETECTED NOT DETECTED Final    Comment: (NOTE) The GeneXpert MRSA Assay (FDA approved for NASAL specimens only), is one component of a comprehensive MRSA colonization surveillance program. It is not intended to diagnose MRSA infection nor to guide or monitor treatment for MRSA infections. Test performance is not FDA approved in patients less than 29 years old. Performed at [facility], [address], [place], [place] [address]       Radiology Studies last 3 days: CT Angio Chest PE W and/or Wo Contrast  Result Date: 07/14/2023 CLINICAL DATA:  Pulmonary embolism (PE) suspected, high prob. Patient c/o  SOB. Seen today and labs drawn. Troponin elevated. EXAM: CT ANGIOGRAPHY CHEST WITH CONTRAST TECHNIQUE: Multidetector CT imaging of the chest was performed using the  standard protocol during bolus administration of intravenous contrast. Multiplanar CT image reconstructions and MIPs were obtained to evaluate the vascular anatomy. RADIATION DOSE REDUCTION: This exam was performed according to the departmental dose-optimization program which includes automated exposure control, adjustment of the mA and/or kV according to patient size and/or use of iterative reconstruction technique. CONTRAST:  75mL OMNIPAQUE IOHEXOL 350 MG/ML SOLN FINDINGS: Cardiovascular: Satisfactory opacification of the pulmonary arteries to the segmental level. No evidence of pulmonary embolism. The main pulmonary artery is normal in caliber. Enlarged heart size. No significant pericardial effusion. The thoracic aorta is normal in caliber. No atherosclerotic plaque of the thoracic aorta. Left anterior descending coronary artery calcifications. Aortic valve leaflet calcification Mediastinum/Nodes: No enlarged mediastinal, hilar, or axillary lymph nodes. Thyroid gland, trachea, and esophagus demonstrate no significant findings. Lungs/Pleura: Chronic peripheral reticulations in trace cystic changes most prominent along the left upper lobe. Interval development of superimposed patchy airspace opacity of left lower lobe (6:55). No pulmonary nodule. No pulmonary mass. No pleural effusion. No pneumothorax. Upper Abdomen: Mild atherosclerotic plaque. No acute abnormality with limited evaluation on this noncontrast study. Musculoskeletal: No chest wall abnormality. No suspicious lytic or blastic osseous lesions. No acute displaced fracture. Review of the MIP images confirms the above findings. IMPRESSION: 1. No pulmonary embolus. 2. Mild pulmonary fibrosis/interstitial lung disease with developing infection/inflammation of the left upper lobe-recommend follow-up CT in 3 months to evaluate for resolution. 3. Cardiomegaly. 4. Aortic Atherosclerosis (ICD10-I70.0) including left anterior descending and aortic valve leaflet  calcifications-correlate for aortic stenosis. Electronically Signed   By: Tish Frederickson M.D.   On: 07/14/2023 18:13   DG Chest 2 View  Result Date: 07/14/2023 CLINICAL DATA:  Shortness of breath.  Elevated troponin. EXAM: CHEST - 2 VIEW COMPARISON:  Chest radiograph dated 05/04/2023 FINDINGS: There is mild diffuse chronic intra coarsening. No focal consolidation, pleural effusion, or pneumothorax. Top-normal cardiac size. Degenerative changes of the spine. No acute osseous pathology. IMPRESSION: No active cardiopulmonary disease. Electronically Signed   By: Elgie Collard M.D.   On: 07/14/2023 16:50       Time spent: 50 min     Sunnie Nielsen, DO [facility] 07/15/2023, 3:02 PM    Dictation software may have been used to generate the above note. Typos may occur and escape review in typed/dictated notes. Please contact Dr Lyn Hollingshead directly for clarity if needed.  Staff may message me via secure chat in Epic  but this may not receive an immediate response,  please page me for urgent matters!  If 7PM-7AM, please contact night coverage [email or web address]

## 2023-07-15 NOTE — Assessment & Plan Note (Signed)
-   Will continue antibiotic therapy with IV Rocephin and Zithromax. - Mucolytic therapy be provided. - We will follow blood cultures.

## 2023-07-15 NOTE — Consult Note (Addendum)
Pharmacy Consult Note - Anticoagulation  Pharmacy Consult for heparin Indication: chest pain/ACS  PATIENT MEASUREMENTS: Height: 5\' 2"  (157.5 cm) Weight: 69.3 kg (152 lb 11.2 oz) IBW/kg (Calculated) : 50.1 HEPARIN DW (KG): 65.2  VITAL SIGNS: Temp: 98.3 F (36.8 C) (11/13 2330) Temp Source: Oral (11/13 2156) BP: 118/96 (11/13 2330) Pulse Rate: 90 (11/13 2330)  Recent Labs    07/14/23 1427 07/14/23 1650 07/14/23 1653 07/14/23 1653 07/15/23 0026  HGB 11.9*  --   --   --   --   HCT 35.3*  --   --   --   --   PLT 300  --   --   --   --   APTT  --   --  41*   < > 75*  LABPROT  --   --  15.2  --   --   INR  --   --  1.2  --   --   HEPARINUNFRC  --   --  <0.10*   < > 0.13*  CREATININE 0.55  --   --   --   --   TROPONINIHS 605* 553*  --   --   --    < > = values in this interval not displayed.    Estimated Creatinine Clearance: 43.5 mL/min (by C-G formula based on SCr of 0.55 mg/dL).  PAST MEDICAL HISTORY: Past Medical History:  Diagnosis Date   Anxiety    Arthritis    Asthma    Cancer (HCC)    melanoma skin cancer on head   GERD (gastroesophageal reflux disease)    History of recurrent cystitis    Hyperlipidemia    Hypertension    Infection of prosthetic hip joint (HCC)    Nocturia    Obesity    Osteopenia    Thyroid disease    Urinary frequency    Urinary incontinence    Urinary urgency    Wears glasses     ASSESSMENT: 89 y.o. female with PMH including Afib, HTN, asthma is presenting with shortness of breath which has been ongoing for a year. Patient is not on chronic anticoagulation per chart review. Recently was prescribed apixaban 2.5 mg twice daily for NOAF but patient has not started the medication yet. Will order baseline heparin level to look for false elevation (which would be possible if actually taking Eliquis PTA) just in case. Pharmacy has been consulted to initiate and manage heparin intravenous infusion.  Pertinent medications: Prescribed  Eliquis 2.5 mg twice daily but reportedly has not picked up yet  Goal(s) of therapy: Heparin level 0.3 - 0.7 units/mL Monitor platelets by anticoagulation protocol: Yes   Baseline anticoagulation labs: Recent Labs    07/14/23 1427 07/14/23 1653 07/15/23 0026  APTT  --  41* 75*  INR  --  1.2  --   HGB 11.9*  --   --   PLT 300  --   --   ^^ ordered a baseline heparin level, has not been processed yet   Date Time aPTT/HL Rate/Comment 11/14 0026 HL 0.13 Subtherapeutic   PLAN: Give 2000 units bolus x 1 Increase heparin infusion to 950 units/hour. Recheck heparin level in 8 hours after rate change, then daily once at least two levels are consecutively therapeutic. Monitor CBC daily while on heparin infusion.  Otelia Sergeant, PharmD, [facility] 07/15/2023 1:04 AM

## 2023-07-15 NOTE — Plan of Care (Signed)
  Problem: Fluid Volume: Goal: Hemodynamic stability will improve Outcome: Progressing   Problem: Respiratory: Goal: Ability to maintain adequate ventilation will improve Outcome: Progressing   Problem: Activity: Goal: Risk for activity intolerance will decrease Outcome: Progressing

## 2023-07-15 NOTE — Progress Notes (Signed)
   07/15/23 0819  Assess: MEWS Score  Temp 98.4 F (36.9 C)  BP 139/72  MAP (mmHg) 93  Pulse Rate (!) 118  SpO2 97 %  O2 Device Nasal Cannula  Assess: MEWS Score  MEWS Temp 0  MEWS Systolic 0  MEWS Pulse 2  MEWS RR 1  MEWS LOC 0  MEWS Score 3  MEWS Score Color Yellow  Assess: if the MEWS score is Yellow or Red  Were vital signs accurate and taken at a resting state? Yes  Does the patient meet 2 or more of the SIRS criteria? No  MEWS guidelines implemented  No, previously yellow, continue vital signs every 4 hours  Notify: Charge Nurse/RN  Name of Charge Nurse/RN Notified Jessica, RN  Provider Notification  Provider Name/Title Dr. Lyn Hollingshead  Date Provider Notified 07/15/23  Time Provider Notified 0820  Method of Notification Page  Notification Reason Other (Comment) (yellow MEWS)  Provider response No new orders  Date of Provider Response 07/15/23  Time of Provider Response 0820  Assess: SIRS CRITERIA  SIRS Temperature  0  SIRS Pulse 1  SIRS Respirations  1  SIRS WBC 0  SIRS Score Sum  2

## 2023-07-15 NOTE — Assessment & Plan Note (Signed)
-   We will continue anti-hypertensive therapy. 

## 2023-07-15 NOTE — Consult Note (Addendum)
Cardiology Consult    Patient ID: [name] MRN: [id number], DOB/AGE: 02/23/1932   Admit date: 07/14/2023 Date of Consult: 07/15/2023  Primary Physician: Marisue Ivan, MD Primary Cardiologist: Julien Nordmann, MD - new Requesting Provider: N. Alexander, DO  Patient Profile    Andrea Griffin is a 89 y.o. female with a history of HTN, HL, hyperthyroidism, anxiety, and GERD, who is being seen today for the evaluation of Afib and NSTEMI at the request of Dr. Lyn Hollingshead.  Past Medical History  Subjective  Past Medical History:  Diagnosis Date   Anxiety    Arthritis    Asthma    Cancer (HCC)    melanoma skin cancer on head   Diastolic dysfunction    a. 05/2023 Echo: EF 70-76^, no rwma, GrII DD, nl RV fxn, mildly dil RA, mod dil LA. Mild MR. Mod AI.   GERD (gastroesophageal reflux disease)    History of recurrent cystitis    Hyperlipidemia    Hypertension    Infection of prosthetic hip joint (HCC)    Mitral regurgitation    Moderate aortic insufficiency    Nocturia    Obesity    Osteopenia    Persistent atrial fibrillation (HCC)    Thyroid disease    Urinary frequency    Urinary incontinence    Urinary urgency    Wears glasses     Past Surgical History:  Procedure Laterality Date   ABDOMINAL HYSTERECTOMY     BACK SURGERY  01/21/2016   lumbar fusion   bladder stem dilation     BREAST BIOPSY Right    x2   BREAST BIOPSY Right    x2   CHOLECYSTECTOMY     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 Rosita Kea   INCISION AND DRAINAGE HIP Right 09/15/2012   INCISION AND DRAINAGE HIP Right 2013   Dr. Rosita Kea   LUMBAR FUSION  01/21/2016   L4-L5 by Dr. Danielle Dess   MELANOMA EXCISION     skin cancer on top of head   OPEN REDUCTION INTERNAL FIXATION (ORIF) DISTAL RADIAL FRACTURE Right 10/05/2019   Procedure: OPEN REDUCTION INTERNAL FIXATION (ORIF) DISTAL RADIAL FRACTURE;   Surgeon: Kennedy Bucker, MD;  Location: [facility] ORS;  Service: Orthopedics;  Laterality: Right;     Allergies  Allergies  Allergen Reactions   Ciprofloxacin Hives   Adhesive [Tape]     Pulls off skin   Nitrofurantoin Other (See Comments)    Can not remember reaction   Phenazopyridine Other (See Comments)    Pt doesn't remember   Ranitidine Other (See Comments)    Can not remember reaction   Sulfa Antibiotics Other (See Comments)    Can not remember reaction      History of Present Illness    89 y/o ? w/ a h/o HTN, HL, hypothyroidism, GERD, and anxiety.  She had a normal stress test in 2004, otherwise no cardiac history.  She was admitted in early September due to weakness in the setting of profound hyponatremia (114) and hypochloremia (77) felt to be due to excessive volume intake.  CT chest showed diffusely enlarged thyroid gland with chronic peripheral interstitial lung disease with honeycombing, coronary calcifications, and cardiomegaly.  Echo showed hyperdynamic LV function with an EF of 70 to 75% grade 2 diastolic dysfunction.  She was subsequently d/c'd.  Andrea Griffin says that more or less since then, she has  been noting elevated heart rates with irregularity and a feeling of anxiety in her chest.  On further questioning, she has been having resting exertional substernal chest heaviness and tightness, associated with dyspnea as well as more profound dyspnea on exertion.  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  She saw her PCP for a routine visit on 11/13, and was noted to be in afib. She was sent for some labs (mild hyponatremia @ 132), and then called and advised to present to the emergency department for further evaluation.   On arrival to the emergency department, she was tachycardic at 126 and atrial fibrillation with normal blood pressure.  She was afebrile.  Labs notable  for hyponatremia at 127 with a chloride of 94, troponin of 613 with subsequent downtrend, and H&H of 11.9/35.3.  TSH normal at 1.468 with mildly elevated free T4 at 1.16.  She was placed on beta-blocker and heparin therapy.  Home dose of Eliquis, which was started earlier today was held.  Patient remains in atrial fibrillation with rates trending in the 1 teens to 120s, with elevations with activity in the room.  She currently denies chest pain or dyspnea.  Husband at bedside.  Inpatient Medications  Subjective    aspirin  81 mg Oral Daily   atorvastatin  80 mg Oral Daily   calcium-vitamin D  2 tablet Oral Daily   methimazole  2.5 mg Oral Daily   [START ON 07/16/2023] metoprolol succinate  50 mg Oral Daily   pantoprazole  40 mg Oral Daily   senna-docusate  2 tablet Oral Daily    Family History    Family History  Problem Relation Age of Onset   Stroke Mother    She indicated that the status of her mother is unknown.   Social History    Social History   Socioeconomic History   Marital status: Married    Spouse name: Not on file   Number of children: Not on file   Years of education: Not on file   Highest education level: Not on file  Occupational History   Not on file  Tobacco Use   Smoking status: Never   Smokeless tobacco: Never  Substance and Sexual Activity   Alcohol use: No   Drug use: No   Sexual activity: Not Currently  Other Topics Concern   Not on file  Social History Narrative   Not on file   Social Determinants of Health   Financial Resource Strain: Low Risk  (07/14/2023)   Received from [facility]   Overall Financial Resource Strain (CARDIA)    Difficulty of Paying Living Expenses: Not hard at all  Food Insecurity: No Food Insecurity (07/15/2023)   Hunger Vital Sign    Worried About Running Out of Food in the Last Year: Never true    Ran Out of Food in the Last Year: Never true  Transportation Needs: No Transportation Needs  (07/15/2023)   PRAPARE - Administrator, Civil Service (Medical): No    Lack of Transportation (Non-Medical): No  Physical Activity: Not on file  Stress: Not on file  Social Connections: Not on file  Intimate Partner Violence: Not At Risk (07/15/2023)   Humiliation, Afraid, Rape, and Kick questionnaire    Fear of Current or Ex-Partner: No    Emotionally Abused: No    Physically Abused: No  Sexually Abused: No     Review of Systems    General:  No chills, fever, night sweats or weight changes.  Cardiovascular: +++ chest pain, +++ dyspnea on exertion, no edema, orthopnea, +++ palpitations, no paroxysmal nocturnal dyspnea. Dermatological: No rash, lesions/masses Respiratory: No cough, +++ dyspnea Urologic: No hematuria, dysuria Abdominal:   No nausea, vomiting, diarrhea, bright red blood per rectum, melena, or hematemesis Neurologic:  No visual changes, wkns, changes in mental status. All other systems reviewed and are otherwise negative except as noted above.    Objective  Physical Exam    Blood pressure (!) 136/116, pulse 67, temperature 98.1 F (36.7 C), resp. rate 19, height 5\' 2"  (1.575 m), weight 69.3 kg, SpO2 95%.  General: Pleasant, NAD Psych: Normal affect. Neuro: Alert and oriented X 3. Moves all extremities spontaneously. HEENT: Normal  Neck: Supple without bruits or JVD. Lungs:  Resp regular and unlabored, CTA. Heart: IR, IR, no s3, s4, or murmurs. Abdomen: Soft, non-tender, non-distended, BS + x 4.  Extremities: No clubbing, cyanosis or edema. DP/PT2+, Radials 2+ and equal bilaterally.  Labs    Cardiac Enzymes Recent Labs  Lab 07/14/23 1156 07/14/23 1427 07/14/23 1650  TROPONINIHS 613* 605* 553*     BNP    Component Value Date/Time   BNP 105 03/25/2012 1202    Lab Results  Component Value Date   WBC 7.8 07/15/2023   HGB 11.0 (L) 07/15/2023   HCT 31.6 (L) 07/15/2023   MCV 92.9 07/15/2023   PLT 277 07/15/2023    Recent Labs  Lab  07/14/23 1156 07/14/23 1427 07/15/23 0608  NA  --    < > 129*  K  --    < > 3.3*  CL  --    < > 95*  CO2  --    < > 22  BUN  --    < > 9  CREATININE  --    < > <0.30*  CALCIUM  --    < > 8.2*  PROT 6.7  --   --   BILITOT 0.7  --   --   ALKPHOS 110  --   --   ALT 34  --   --   AST 39  --   --   GLUCOSE  --    < > 91   < > = values in this interval not displayed.    Radiology Studies    CT Angio Chest PE W and/or Wo Contrast  Result Date: 07/14/2023 CLINICAL DATA:  Pulmonary embolism (PE) suspected, high prob. Patient c/o SOB. Seen today and labs drawn. Troponin elevated. EXAM: CT ANGIOGRAPHY CHEST WITH CONTRAST TECHNIQUE: Multidetector CT imaging of the chest was performed using the standard protocol during bolus administration of intravenous contrast. Multiplanar CT image reconstructions and MIPs were obtained to evaluate the vascular anatomy. RADIATION DOSE REDUCTION: This exam was performed according to the departmental dose-optimization program which includes automated exposure control, adjustment of the mA and/or kV according to patient size and/or use of iterative reconstruction technique. CONTRAST:  75mL OMNIPAQUE IOHEXOL 350 MG/ML SOLN FINDINGS: Cardiovascular: Satisfactory opacification of the pulmonary arteries to the segmental level. No evidence of pulmonary embolism. The main pulmonary artery is normal in caliber. Enlarged heart size. No significant pericardial effusion. The thoracic aorta is normal in caliber. No atherosclerotic plaque of the thoracic aorta. Left anterior descending coronary artery calcifications. Aortic valve leaflet calcification Mediastinum/Nodes: No enlarged mediastinal, hilar, or axillary lymph nodes. Thyroid gland, trachea, and  esophagus demonstrate no significant findings. Lungs/Pleura: Chronic peripheral reticulations in trace cystic changes most prominent along the left upper lobe. Interval development of superimposed patchy airspace opacity of left lower  lobe (6:55). No pulmonary nodule. No pulmonary mass. No pleural effusion. No pneumothorax. Upper Abdomen: Mild atherosclerotic plaque. No acute abnormality with limited evaluation on this noncontrast study. Musculoskeletal: No chest wall abnormality. No suspicious lytic or blastic osseous lesions. No acute displaced fracture. Review of the MIP images confirms the above findings. IMPRESSION: 1. No pulmonary embolus. 2. Mild pulmonary fibrosis/interstitial lung disease with developing infection/inflammation of the left upper lobe-recommend follow-up CT in 3 months to evaluate for resolution. 3. Cardiomegaly. 4. Aortic Atherosclerosis (ICD10-I70.0) including left anterior descending and aortic valve leaflet calcifications-correlate for aortic stenosis. Electronically Signed   By: Tish Frederickson M.D.   On: 07/14/2023 18:13   DG Chest 2 View  Result Date: 07/14/2023 CLINICAL DATA:  Shortness of breath.  Elevated troponin. EXAM: CHEST - 2 VIEW COMPARISON:  Chest radiograph dated 05/04/2023 FINDINGS: There is mild diffuse chronic intra coarsening. No focal consolidation, pleural effusion, or pneumothorax. Top-normal cardiac size. Degenerative changes of the spine. No acute osseous pathology. IMPRESSION: No active cardiopulmonary disease. Electronically Signed   By: Elgie Collard M.D.   On: 07/14/2023 16:50      ECG & Cardiac Imaging    Aib, 119, LAD, LAFB - personally reviewed.  Assessment & Plan    1.  Atrial fibrillation with rapid ventricular response: Over the past several weeks to months, patient has been experiencing anxious type sensation in her chest associated with palpitations, and resting exertional chest tightness and dyspnea.  She was diagnosed with atrial fibrillation by her primary care provider at a routine office visit on November 13, and was placed on Eliquis 2.5 mg twice daily, and metoprolol.  She then received a call from her provider's office advising that she present to the emergency  department.  Here, she was in A-fib with RVR.  Rates have been trending in the 1 teens to 120s.  We have increased metoprolol succinate to 50 mg daily.  Continue heparin in the setting of troponin elevation-see below.  When transitioned back to Eliquis, given weight greater than 60 kg and creatinine less than 1.5, she should be on 5 mg twice daily, not the previously prescribed dose.  2.  Non-STEMI: Patient has been experiencing resting exertional substernal chest heaviness associate with dyspnea.  On arrival, troponin was 613 since trended in a downward fashion.  She denies chest pain at this time.  Prior echo in September showed normal LV function.  Will obtain limited echo to reevaluate for wall motion abnormalities.  Will likely need diagnostic catheterization to rule out obstructive coronary artery disease.  Will keep n.p.o. after midnight.  Continue beta-blocker and statin.  Adding asa.  3.  HL:  cont statin.  LDL 62 yesterday.  Cont statin.  4.  Hypokalemia:  supplementation ordered.  5.  Hyperthyroidism: Nl TSH w/ mildly elevated FT4.  On methimazole. Cont ? blocker.  6.  UTI:  Abx per IM.  Risk Assessment/Risk Scores:     TIMI Risk Score for Unstable Angina or Non-ST Elevation MI:   The patient's TIMI risk score is 3, which indicates a 13% risk of all cause mortality, new or recurrent myocardial infarction or need for urgent revascularization in the next 14 days.    CHA2DS2-VASc Score = 4   This indicates a 4.8% annual risk of stroke. The patient's score is  based upon: CHF History: 0 HTN History: 1 Diabetes History: 0 Stroke History: 0 Vascular Disease History: 0 Age Score: 2 Gender Score: 1     Signed, Nicolasa Ducking, NP 07/15/2023, 3:11 PM  For questions or updates, please contact   Please consult [email or web address] for contact info under Cardiology/STEMI.

## 2023-07-15 NOTE — Assessment & Plan Note (Deleted)
-   This could be related to #1 and possibly to sepsis. - We will continue hydration with IV normal saline as well as continue IV heparin as mentioned above. - We will continue beta-blocker therapy with Toprol-XL. - We will utilize IV Cardizem as needed.

## 2023-07-15 NOTE — Assessment & Plan Note (Signed)
-   This could be related to #1 and possibly to sepsis. - We will continue hydration with IV normal saline as well as continue IV heparin as mentioned above. - We will continue beta-blocker therapy with Toprol-XL. - We will utilize IV Cardizem as needed.

## 2023-07-15 NOTE — Plan of Care (Signed)

## 2023-07-15 NOTE — Assessment & Plan Note (Signed)
-   The patient be admitted to a progressive unit bed. - We will continue IV heparin to replace Eliquis. - High-dose statin will be provided as well as beta-blocker therapy. - 2D echo and cardiology consult to be obtained. - Dr. Elwyn Lade was notified about the patient.

## 2023-07-15 NOTE — TOC Benefit Eligibility Note (Signed)
 Patient Product/process development scientist completed.    The patient is insured through [employer]. Patient has Medicare and is not eligible for a copay card, but may be able to apply for patient assistance, if available.    Ran test claim for Eliquis 5 mg and the current 30 day co-pay is $40.00.   This test claim was processed through [facility]- copay amounts may vary at other pharmacies due to pharmacy/plan contracts, or as the patient moves through the different stages of their insurance plan.     Roland Earl, CPHT Pharmacy Technician III Certified Patient Advocate [facility] Patient Advocate Team Direct Number: [phone]  Fax: [phone]

## 2023-07-16 ENCOUNTER — Inpatient Hospital Stay (HOSPITAL_COMMUNITY)
Admit: 2023-07-16 | Discharge: 2023-07-16 | Disposition: A | Discharge status: Home / Self Care | Payer: Medicare PPO | Attending: Cardiovascular Disease | Admitting: Cardiovascular Disease

## 2023-07-16 ENCOUNTER — Encounter
Admission: EM | Disposition: A | Discharge status: Home / Self Care | Payer: Self-pay | Source: Home / Self Care | Attending: Osteopathic Medicine

## 2023-07-16 ENCOUNTER — Inpatient Hospital Stay
Admit: 2023-07-16 | Discharge: 2023-07-16 | Disposition: A | Discharge status: Home / Self Care | Payer: Medicare PPO | Attending: Nurse Practitioner | Admitting: Nurse Practitioner

## 2023-07-16 DIAGNOSIS — I214 Non-ST elevation (NSTEMI) myocardial infarction: Secondary | ICD-10-CM | POA: Diagnosis not present

## 2023-07-16 DIAGNOSIS — R079 Chest pain, unspecified: Secondary | ICD-10-CM | POA: Diagnosis not present

## 2023-07-16 HISTORY — PX: LEFT HEART CATH AND CORONARY ANGIOGRAPHY: CATH118249

## 2023-07-16 LAB — BASIC METABOLIC PANEL
Anion gap: 11 (ref 5–15)
BUN: 8 mg/dL (ref 8–23)
CO2: 21 mmol/L — ABNORMAL LOW (ref 22–32)
Calcium: 8.3 mg/dL — ABNORMAL LOW (ref 8.9–10.3)
Chloride: 98 mmol/L (ref 98–111)
Creatinine, Ser: 0.42 mg/dL — ABNORMAL LOW (ref 0.44–1.00)
GFR, Estimated: >60 mL/min (ref >60)
Glucose, Bld: 90 mg/dL (ref 70–99)
Potassium: 3.8 mmol/L (ref 3.5–5.1)
Sodium: 130 mmol/L — ABNORMAL LOW (ref 135–145)

## 2023-07-16 LAB — CBC
HCT: 33.9 % — ABNORMAL LOW (ref 36.0–46.0)
Hemoglobin: 11.5 g/dL — ABNORMAL LOW (ref 12.0–15.0)
MCH: 32.3 pg (ref 26.0–34.0)
MCHC: 33.9 g/dL (ref 30.0–36.0)
MCV: 95.2 fL (ref 80.0–100.0)
Platelets: 305 10*3/uL (ref 150–400)
RBC: 3.56 MIL/uL — ABNORMAL LOW (ref 3.87–5.11)
RDW: 12.3 % (ref 11.5–15.5)
WBC: 8.2 10*3/uL (ref 4.0–10.5)
nRBC: 0 % (ref 0.0–0.2)

## 2023-07-16 LAB — HEPARIN LEVEL (UNFRACTIONATED): Heparin Unfractionated: 0.53 [IU]/mL (ref 0.30–0.70)

## 2023-07-16 SURGERY — LEFT HEART CATH AND CORONARY ANGIOGRAPHY
Anesthesia: Moderate Sedation

## 2023-07-16 MED ORDER — SODIUM CHLORIDE 0.9% FLUSH
3.0000 mL | Freq: Two times a day (BID) | INTRAVENOUS | Status: DC
  Administered 2023-07-16 – 2023-07-17 (×2): 3 mL via INTRAVENOUS

## 2023-07-16 MED ORDER — ATORVASTATIN CALCIUM 20 MG PO TABS
40.0000 mg | ORAL_TABLET | Freq: Every day | ORAL | Status: DC
  Administered 2023-07-17: 40 mg via ORAL
  Filled 2023-07-16: qty 2

## 2023-07-16 MED ORDER — POTASSIUM CHLORIDE CRYS ER 20 MEQ PO TBCR: 20.0000 meq | EXTENDED_RELEASE_TABLET | Freq: Once | ORAL | Status: DC

## 2023-07-16 MED ORDER — HEPARIN (PORCINE) IN NACL 2000-0.9 UNIT/L-% IV SOLN
INTRAVENOUS | Status: DC | PRN
  Administered 2023-07-16: 1000 mL

## 2023-07-16 MED ORDER — VERAPAMIL HCL 2.5 MG/ML IV SOLN
INTRAVENOUS | Status: AC
  Filled 2023-07-16: qty 2

## 2023-07-16 MED ORDER — VERAPAMIL HCL 2.5 MG/ML IV SOLN
INTRAVENOUS | Status: DC | PRN
  Administered 2023-07-16: 2.5 mg via INTRA_ARTERIAL

## 2023-07-16 MED ORDER — HEPARIN (PORCINE) IN NACL 1000-0.9 UT/500ML-% IV SOLN
INTRAVENOUS | Status: AC
  Filled 2023-07-16: qty 1000

## 2023-07-16 MED ORDER — IOHEXOL 300 MG/ML  SOLN
INTRAMUSCULAR | Status: DC | PRN
  Administered 2023-07-16: 28 mL

## 2023-07-16 MED ORDER — SODIUM CHLORIDE 0.9 % IV SOLN: INTRAVENOUS | Status: DC

## 2023-07-16 MED ORDER — MIDAZOLAM HCL 2 MG/2ML IJ SOLN
INTRAMUSCULAR | Status: AC
Start: 2023-07-16 — End: ?
  Filled 2023-07-16: qty 2

## 2023-07-16 MED ORDER — SODIUM CHLORIDE 0.9% FLUSH: 3.0000 mL | INTRAVENOUS | Status: DC | PRN

## 2023-07-16 MED ORDER — METOPROLOL SUCCINATE ER 50 MG PO TB24
50.0000 mg | ORAL_TABLET | Freq: Two times a day (BID) | ORAL | Status: DC
  Administered 2023-07-16 – 2023-07-17 (×2): 50 mg via ORAL
  Filled 2023-07-16 (×2): qty 1

## 2023-07-16 MED ORDER — MIDAZOLAM HCL 2 MG/2ML IJ SOLN
INTRAMUSCULAR | Status: DC | PRN
  Administered 2023-07-16: .5 mg via INTRAVENOUS

## 2023-07-16 MED ORDER — HEPARIN SODIUM (PORCINE) 1000 UNIT/ML IJ SOLN
INTRAMUSCULAR | Status: AC
  Filled 2023-07-16: qty 10

## 2023-07-16 MED ORDER — HEPARIN (PORCINE) 25000 UT/250ML-% IV SOLN: 1300.0000 [IU]/h | INTRAVENOUS | Status: DC

## 2023-07-16 MED ORDER — HEPARIN SODIUM (PORCINE) 1000 UNIT/ML IJ SOLN
INTRAMUSCULAR | Status: DC | PRN
  Administered 2023-07-16: 3500 [IU] via INTRAVENOUS

## 2023-07-16 MED ORDER — ASPIRIN 81 MG PO CHEW: 81.0000 mg | CHEWABLE_TABLET | ORAL | Status: DC

## 2023-07-16 MED ORDER — SODIUM CHLORIDE 0.9 % IV SOLN: 250.0000 mL | INTRAVENOUS | Status: DC | PRN

## 2023-07-16 MED ORDER — HEPARIN (PORCINE) 25000 UT/250ML-% IV SOLN
1300.0000 [IU]/h | INTRAVENOUS | Status: DC
  Administered 2023-07-16: 1300 [IU]/h via INTRAVENOUS
  Filled 2023-07-16: qty 250

## 2023-07-16 MED ORDER — FENTANYL CITRATE (PF) 100 MCG/2ML IJ SOLN
INTRAMUSCULAR | Status: AC
  Filled 2023-07-16: qty 2

## 2023-07-16 MED ORDER — SODIUM CHLORIDE 0.9 % IV SOLN: INTRAVENOUS | Status: AC

## 2023-07-16 SURGICAL SUPPLY — 10 items
CATH INFINITI AMBI 5FR JK (CATHETERS) IMPLANT
DEVICE RAD TR BAND REGULAR (VASCULAR PRODUCTS) IMPLANT
DRAPE BRACHIAL (DRAPES) IMPLANT
GLIDESHEATH SLEND SS 6F .021 (SHEATH) IMPLANT
GUIDEWIRE INQWIRE 1.5J.035X260 (WIRE) IMPLANT
INQWIRE 1.5J .035X260CM (WIRE) ×1
PACK CARDIAC CATH (CUSTOM PROCEDURE TRAY) ×1 IMPLANT
PROTECTION STATION PRESSURIZED (MISCELLANEOUS) ×1
SET ATX-X65L (MISCELLANEOUS) IMPLANT
STATION PROTECTION PRESSURIZED (MISCELLANEOUS) IMPLANT

## 2023-07-16 NOTE — Consult Note (Signed)
PHARMACY CONSULT NOTE - ELECTROLYTES  Pharmacy Consult for Electrolyte Monitoring and Replacement   Recent Labs: Height: 5\' 2"  (157.5 cm) Weight: 69.3 kg (152 lb 11.2 oz) IBW/kg (Calculated) : 50.1 Estimated Creatinine Clearance: 43.5 mL/min (A) (by C-G formula based on SCr of 0.42 mg/dL (L)). Potassium (mmol/L)  Date Value  07/16/2023 3.8  03/28/2014 4.2   Magnesium (mg/dL)  Date Value  62/13/0865 1.8  05/03/2012 1.3 (L)   Calcium (mg/dL)  Date Value  78/46/9629 8.3 (L)   Calcium, Total (mg/dL)  Date Value  52/84/1324 7.8 (L)   Albumin (g/dL)  Date Value  40/05/2724 3.2 (L)  10/16/2012 3.0 (L)   Phosphorus (mg/dL)  Date Value  36/64/4034 4.0   Sodium (mmol/L)  Date Value  07/16/2023 130 (L)  03/28/2014 137   Assessment  Andrea Griffin is a 89 y.o. female presenting with palpitations and chest pain. PMH significant for hypertension, dyslipidemia, asthma, GERD, hyperthyroidism and anxiety . Pharmacy has been consulted to monitor and replace electrolytes.  Diet: NPO MIVF: IV lock Pertinent medications: n/a  Goal of Therapy: Electrolytes WNL  Plan:  K 3.8, Will optimize to 4 due to admission with "palpitation" . Kcl po x 1 ordered this AM. Check BMP with AM labs  Thank you for allowing pharmacy to be a part of this patient's care.  Paulita Fujita, PharmD Clinical Pharmacist 07/16/2023 7:23 AM

## 2023-07-16 NOTE — Progress Notes (Signed)
PROGRESS NOTE    Andrea Griffin   WUJ:[id number] DOB: 1932/06/11  DOA: 07/14/2023 Date of Service: 07/16/23 which is hospital day 2  PCP: Marisue Ivan, MD    HPI: Andrea Griffin is a 89 y.o. Caucasian female with medical history significant for hypertension, dyslipidemia, asthma, GERD, hyperthyroidism and anxiety, who presented to the emergency room 07/14/23 with acute onset of palpitations with a feeling of skipped heart beats as well as mild sharp midsternal chest pain with no radiation.  She has been having dyspnea on exertion lately and dry cough without wheezing.   Of note, was in PCP office earlier 07/14/23 for annual - new dx Afib by EKG w/ rate 119, started on metoprolol succinate 25 mg daily and eliquis 2.5 mg bid, later same day presented to ED as above.   Of note, was hospitalized 05/04/23-05/07/23 for hyponatremia, echo done to evaluate cardiomegaly, 05/12/23 f/u w/ PCP. Hyponatremia has been chronic/recurring, has responded to fluid restriction in the past    Hospital course / significant events:  11/13: to ED, admitted to hospitalist service for sepsis d/t UTI and questionable pneumonia/CAP, NSTEMI, hyponatremia 11/14: cardiology saw pt, adjusting meds and getting echocardiogram, consideration for cath vs noninvasive testing 11/15: cardiac cath pending for today. Echocardiogram EF 70-75, G2DD, LA moderate dilation  Consultants:  Cardiology   Procedures/Surgeries: none      ASSESSMENT & PLAN:   NSTEMI (non-ST elevated myocardial infarction)  Heparin infusion, holding home eliquis ASA, statin, beta blocker Cardiology following  Cardiac cath pending   Paroxysmal atrial fibrillation with RVR  Attributed to NSTEMI and/or to sepsis. Treat underlying causes Heparin infusion for NSTEMI will serve as anticoagulation, holding home eliquis continue beta-blocker therapy with Toprol-XL. IV Cardizem as needed. Cardiology following Cardiac cath pending     Possible sepsis due to UTI (supported by UA, as well as symptoms), less likely CAP (supported by CT chest findings and procalcitonin elevation but she is not having cough) Possible SIRS d/t Afib/NSTEMI  BCx NGx2d  IV fluids d/c   IV Rocephin and Zithromax. Follow urine and blood cultures --> pending   Hyponatremia, mild  Likely hypovolemic given sepsis  IV fluids d/c Trend BMP --> improving   Hyperthyroidism continue methimazole. check TSH --> WNL, and T4 very slightly above normal Follow outpatient    Essential hypertension do not administer metoprolol if BP <110/70 or if HR <50 Holding home amlodipine and losartan pending rate control w/ beta blocker         overweight based on BMI: Body mass index is 27.93 kg/m.  Underweight - under 18.5  normal weight - 18.5 to 24.9 overweight - 25 to 29.9 obese - 30 or more   DVT prophylaxis: heparin IV fluids: no continuous IV fluids  Nutrition: cardiac diet Central lines / invasive devices: none  Code Status: DNR ACP documentation reviewed: 07/15/23 and has HCPOA and LW on file in [facility]  TOC needs: none  Barriers to dispo / significant pending items: echo, clinical improvement, remain on heparin 48h / per cardiology             Subjective / Brief ROS:  Patient reports doing well today Denies CP/SOB.  Pain controlled. Family Communication: husband and daughter at bedside on rounds     Objective Findings:  Vitals:   07/16/23 1559 07/16/23 1604 07/16/23 1621 07/16/23 1630  BP: 136/82 (!) 133/90 131/63 95/83  Pulse: (!) 112 99  100  Resp: (!) 21 20 (!) 22 20  Temp:  TempSrc:      SpO2: 97% 98% 96% 95%  Weight:      Height:        Intake/Output Summary (Last 24 hours) at 07/16/2023 1642 Last data filed at 07/16/2023 0500 Gross per 24 hour  Intake 516.8 ml  Output --  Net 516.8 ml   Filed Weights   07/14/23 1503 07/14/23 2156  Weight: 71.2 kg 69.3 kg    Examination:  Physical  Exam Constitutional:      General: She is not in acute distress. Cardiovascular:     Rate and Rhythm: Normal rate. Rhythm irregular.  Pulmonary:     Effort: Pulmonary effort is normal.     Breath sounds: Normal breath sounds.  Musculoskeletal:     Right lower leg: No edema.     Left lower leg: No edema.  Neurological:     Mental Status: She is alert and oriented to person, place, and time.  Psychiatric:        Mood and Affect: Mood normal.        Behavior: Behavior normal.          Scheduled Medications:   [MAR Hold] aspirin  81 mg Oral Daily   [START ON 07/17/2023] aspirin  81 mg Oral Pre-Cath   [MAR Hold] atorvastatin  80 mg Oral Daily   [MAR Hold] calcium-vitamin D  2 tablet Oral Daily   [MAR Hold] methimazole  2.5 mg Oral Daily   [MAR Hold] metoprolol succinate  50 mg Oral Daily   [MAR Hold] pantoprazole  40 mg Oral Daily   [MAR Hold] potassium chloride  20 mEq Oral Once   [MAR Hold] senna-docusate  2 tablet Oral Daily    Continuous Infusions:  sodium chloride 100 mL/hr at 07/16/23 1442   sodium chloride     [MAR Hold] azithromycin 500 mg (07/15/23 2141)   [MAR Hold] cefTRIAXone (ROCEPHIN)  IV 2 g (07/15/23 1846)   heparin Stopped (07/16/23 1410)    PRN Medications:  [MAR Hold] acetaminophen **OR** [MAR Hold] acetaminophen, Heparin (Porcine) in NaCl, heparin sodium (porcine), iohexol, midazolam, [MAR Hold]  morphine injection, [MAR Hold] nitroGLYCERIN, [MAR Hold] ondansetron **OR** [MAR Hold] ondansetron (ZOFRAN) IV, [MAR Hold] polyethylene glycol, [MAR Hold] traZODone, verapamil  Antimicrobials from admission:  Anti-infectives (From admission, onward)    Start     Dose/Rate Route Frequency Ordered Stop   07/14/23 2015  cefTRIAXone (ROCEPHIN) 2 g in sodium chloride 0.9 % 100 mL IVPB  Status:  Discontinued        2 g 200 mL/hr over 30 Minutes Intravenous Every 24 hours 07/14/23 2003 07/14/23 2005   07/14/23 2015  azithromycin (ZITHROMAX) 500 mg in dextrose 5 %  250 mL IVPB  Status:  Discontinued        500 mg 250 mL/hr over 60 Minutes Intravenous Every 24 hours 07/14/23 2003 07/14/23 2005   07/14/23 1900  [MAR Hold]  cefTRIAXone (ROCEPHIN) 2 g in sodium chloride 0.9 % 100 mL IVPB        (MAR Hold since Fri 07/16/2023 at 1422.Hold Reason: Transfer to a Procedural area)   2 g 200 mL/hr over 30 Minutes Intravenous Every 24 hours 07/14/23 1850 07/19/23 1759   07/14/23 1900  [MAR Hold]  azithromycin (ZITHROMAX) 500 mg in dextrose 5 % 250 mL IVPB        (MAR Hold since Fri 07/16/2023 at 1422.Hold Reason: Transfer to a Procedural area)   500 mg 250 mL/hr over 60 Minutes Intravenous Every 24  hours 07/14/23 1850 07/17/23 2159           Data Reviewed:  I have personally reviewed the following...  CBC: Recent Labs  Lab 07/14/23 1427 07/15/23 0608 07/16/23 0538  WBC 8.3 7.8 8.2  HGB 11.9* 11.0* 11.5*  HCT 35.3* 31.6* 33.9*  MCV 97.5 92.9 95.2  PLT 300 277 305   Basic Metabolic Panel: Recent Labs  Lab 07/14/23 1156 07/14/23 1427 07/15/23 0608 07/16/23 0538  NA  --  127* 129* 130*  K  --  4.0 3.3* 3.8  CL  --  94* 95* 98  CO2  --  22 22 21*  GLUCOSE  --  130* 91 90  BUN  --  14 9 8   CREATININE  --  0.55 <0.30* 0.42*  CALCIUM  --  8.2* 8.2* 8.3*  MG 1.8  --   --   --    GFR: Estimated Creatinine Clearance: 43.5 mL/min (A) (by C-G formula based on SCr of 0.42 mg/dL (L)). Liver Function Tests: Recent Labs  Lab 07/14/23 1156  AST 39  ALT 34  ALKPHOS 110  BILITOT 0.7  PROT 6.7  ALBUMIN 3.2*   No results for input(s): "LIPASE", "AMYLASE" in the last 168 hours. No results for input(s): "AMMONIA" in the last 168 hours. Coagulation Profile: Recent Labs  Lab 07/14/23 1653 07/15/23 0608  INR 1.2 1.2   Cardiac Enzymes: No results for input(s): "CKTOTAL", "CKMB", "CKMBINDEX", "TROPONINI" in the last 168 hours. BNP (last 3 results) No results for input(s): "PROBNP" in the last 8760 hours. HbA1C: No results for input(s):  "HGBA1C" in the last 72 hours. CBG: No results for input(s): "GLUCAP" in the last 168 hours. Lipid Profile: No results for input(s): "CHOL", "HDL", "LDLCALC", "TRIG", "CHOLHDL", "LDLDIRECT" in the last 72 hours. Thyroid Function Tests: Recent Labs    07/14/23 1156  TSH 1.468  FREET4 1.16*   Anemia Panel: No results for input(s): "VITAMINB12", "FOLATE", "FERRITIN", "TIBC", "IRON", "RETICCTPCT" in the last 72 hours. Most Recent Urinalysis On File:     Component Value Date/Time   COLORURINE AMBER (A) 07/14/2023 1519   APPEARANCEUR TURBID (A) 07/14/2023 1519   APPEARANCEUR Clear 03/13/2014 1403   LABSPEC 1.018 07/14/2023 1519   LABSPEC 1.030 03/13/2014 1403   PHURINE 5.0 07/14/2023 1519   GLUCOSEU NEGATIVE 07/14/2023 1519   GLUCOSEU Negative 03/13/2014 1403   HGBUR NEGATIVE 07/14/2023 1519   BILIRUBINUR NEGATIVE 07/14/2023 1519   BILIRUBINUR Negative 03/13/2014 1403   KETONESUR NEGATIVE 07/14/2023 1519   PROTEINUR 100 (A) 07/14/2023 1519   NITRITE NEGATIVE 07/14/2023 1519   LEUKOCYTESUR LARGE (A) 07/14/2023 1519   LEUKOCYTESUR Negative 03/13/2014 1403   Sepsis Labs: @LABRCNTIP (procalcitonin:4,lacticidven:4) Microbiology: Recent Results (from the past 240 hour(s))  Urine Culture     Status: Abnormal (Preliminary result)   Collection Time: 07/14/23  3:19 PM   Specimen: Urine, Random  Result Value Ref Range Status   Specimen Description   Final    URINE, RANDOM Performed at [facility], [address] [place], [place] [address]    Special Requests   Final    NONE Reflexed from [id number] Performed at [facility], [address] [place], [place] [address]    Culture (A)  Final    >=100,000 COLONIES/mL ESCHERICHIA COLI SUSCEPTIBILITIES TO FOLLOW Performed at [facility], [address], [place], [place] [address]    Report Status PENDING  Incomplete  Blood Culture (routine x 2)     Status: None (Preliminary  result)   Collection Time: 07/14/23  10:50 PM   Specimen: BLOOD  Result Value Ref Range Status   Specimen Description BLOOD BLOOD LEFT HAND  Final   Special Requests   Final    BOTTLES DRAWN AEROBIC AND ANAEROBIC Blood Culture results may not be optimal due to an excessive volume of blood received in culture bottles   Culture   Final    NO GROWTH 2 DAYS Performed at [facility], [address] [place], [place] [address]    Report Status PENDING  Incomplete  Blood Culture (routine x 2)     Status: None (Preliminary result)   Collection Time: 07/14/23 10:50 PM   Specimen: BLOOD  Result Value Ref Range Status   Specimen Description BLOOD BLOOD RIGHT HAND  Final   Special Requests   Final    BOTTLES DRAWN AEROBIC AND ANAEROBIC Blood Culture adequate volume   Culture   Final    NO GROWTH 2 DAYS Performed at [facility], [address] [place], [place] [address]    Report Status PENDING  Incomplete  Resp panel by RT-PCR (RSV, Flu A&B, Covid) Nasal Mucosa     Status: None   Collection Time: 07/15/23 12:08 AM   Specimen: Nasal Mucosa; Nasal Swab  Result Value Ref Range Status   SARS Coronavirus 2 by RT PCR NEGATIVE NEGATIVE Final    Comment: (NOTE) SARS-CoV-2 target nucleic acids are NOT DETECTED.  The SARS-CoV-2 RNA is generally detectable in upper respiratory specimens during the acute phase of infection. The lowest concentration of SARS-CoV-2 viral copies this assay can detect is 138 copies/mL. A negative result does not preclude SARS-Cov-2 infection and should not be used as the sole basis for treatment or other patient management decisions. A negative result may occur with  improper specimen collection/handling, submission of specimen other than nasopharyngeal swab, presence of viral mutation(s) within the areas targeted by this assay, and inadequate number of viral copies(<138 copies/mL). A negative result must be combined with clinical observations, patient history, and  epidemiological information. The expected result is Negative.  Fact Sheet for Patients:  BloggerCourse.com  Fact Sheet for Healthcare Providers:  SeriousBroker.it  This test is no t yet approved or cleared by the [place] FDA and  has been authorized for detection and/or diagnosis of SARS-CoV-2 by FDA under an Emergency Use Authorization (EUA). This EUA will remain  in effect (meaning this test can be used) for the duration of the COVID-19 declaration under Section 564(b)(1) of the Act, 21 U.S.C.section 360bbb-3(b)(1), unless the authorization is terminated  or revoked sooner.       Influenza A by PCR NEGATIVE NEGATIVE Final   Influenza B by PCR NEGATIVE NEGATIVE Final    Comment: (NOTE) The Xpert Xpress SARS-CoV-2/FLU/RSV plus assay is intended as an aid in the diagnosis of influenza from Nasopharyngeal swab specimens and should not be used as a sole basis for treatment. Nasal washings and aspirates are unacceptable for Xpert Xpress SARS-CoV-2/FLU/RSV testing.  Fact Sheet for Patients: BloggerCourse.com  Fact Sheet for Healthcare Providers: SeriousBroker.it  This test is not yet approved or cleared by the [place] FDA and has been authorized for detection and/or diagnosis of SARS-CoV-2 by FDA under an Emergency Use Authorization (EUA). This EUA will remain in effect (meaning this test can be used) for the duration of the COVID-19 declaration under Section 564(b)(1) of the Act, 21 U.S.C. section 360bbb-3(b)(1), unless the authorization is terminated or revoked.     Resp  Syncytial Virus by PCR NEGATIVE NEGATIVE Final    Comment: (NOTE) Fact Sheet for Patients: BloggerCourse.com  Fact Sheet for Healthcare Providers: SeriousBroker.it  This test is not yet approved or cleared by the [place] FDA and has been  authorized for detection and/or diagnosis of SARS-CoV-2 by FDA under an Emergency Use Authorization (EUA). This EUA will remain in effect (meaning this test can be used) for the duration of the COVID-19 declaration under Section 564(b)(1) of the Act, 21 U.S.C. section 360bbb-3(b)(1), unless the authorization is terminated or revoked.  Performed at [facility], [address] [place], [place] [address]   MRSA Next Gen by PCR, Nasal     Status: None   Collection Time: 07/15/23 12:08 AM   Specimen: Nasal Mucosa; Nasal Swab  Result Value Ref Range Status   MRSA by PCR Next Gen NOT DETECTED NOT DETECTED Final    Comment: (NOTE) The GeneXpert MRSA Assay (FDA approved for NASAL specimens only), is one component of a comprehensive MRSA colonization surveillance program. It is not intended to diagnose MRSA infection nor to guide or monitor treatment for MRSA infections. Test performance is not FDA approved in patients less than 73 years old. Performed at [facility], [address] [place], [place] [address]       Radiology Studies last 3 days: CT Angio Chest PE W and/or Wo Contrast  Result Date: 07/14/2023 CLINICAL DATA:  Pulmonary embolism (PE) suspected, high prob. Patient c/o SOB. Seen today and labs drawn. Troponin elevated. EXAM: CT ANGIOGRAPHY CHEST WITH CONTRAST TECHNIQUE: Multidetector CT imaging of the chest was performed using the standard protocol during bolus administration of intravenous contrast. Multiplanar CT image reconstructions and MIPs were obtained to evaluate the vascular anatomy. RADIATION DOSE REDUCTION: This exam was performed according to the departmental dose-optimization program which includes automated exposure control, adjustment of the mA and/or kV according to patient size and/or use of iterative reconstruction technique. CONTRAST:  75mL OMNIPAQUE IOHEXOL 350 MG/ML SOLN FINDINGS: Cardiovascular: Satisfactory opacification of the pulmonary  arteries to the segmental level. No evidence of pulmonary embolism. The main pulmonary artery is normal in caliber. Enlarged heart size. No significant pericardial effusion. The thoracic aorta is normal in caliber. No atherosclerotic plaque of the thoracic aorta. Left anterior descending coronary artery calcifications. Aortic valve leaflet calcification Mediastinum/Nodes: No enlarged mediastinal, hilar, or axillary lymph nodes. Thyroid gland, trachea, and esophagus demonstrate no significant findings. Lungs/Pleura: Chronic peripheral reticulations in trace cystic changes most prominent along the left upper lobe. Interval development of superimposed patchy airspace opacity of left lower lobe (6:55). No pulmonary nodule. No pulmonary mass. No pleural effusion. No pneumothorax. Upper Abdomen: Mild atherosclerotic plaque. No acute abnormality with limited evaluation on this noncontrast study. Musculoskeletal: No chest wall abnormality. No suspicious lytic or blastic osseous lesions. No acute displaced fracture. Review of the MIP images confirms the above findings. IMPRESSION: 1. No pulmonary embolus. 2. Mild pulmonary fibrosis/interstitial lung disease with developing infection/inflammation of the left upper lobe-recommend follow-up CT in 3 months to evaluate for resolution. 3. Cardiomegaly. 4. Aortic Atherosclerosis (ICD10-I70.0) including left anterior descending and aortic valve leaflet calcifications-correlate for aortic stenosis. Electronically Signed   By: Tish Frederickson M.D.   On: 07/14/2023 18:13   DG Chest 2 View  Result Date: 07/14/2023 CLINICAL DATA:  Shortness of breath.  Elevated troponin. EXAM: CHEST - 2 VIEW COMPARISON:  Chest radiograph dated 05/04/2023 FINDINGS: There is mild diffuse chronic intra coarsening. No focal consolidation, pleural effusion, or pneumothorax. Top-normal  cardiac size. Degenerative changes of the spine. No acute osseous pathology. IMPRESSION: No active cardiopulmonary  disease. Electronically Signed   By: Elgie Collard M.D.   On: 07/14/2023 16:50       Time spent: 50 min     Sunnie Nielsen, DO [facility] 07/16/2023, 4:42 PM    Dictation software may have been used to generate the above note. Typos may occur and escape review in typed/dictated notes. Please contact Dr Lyn Hollingshead directly for clarity if needed.  Staff may message me via secure chat in Epic  but this may not receive an immediate response,  please page me for urgent matters!  If 7PM-7AM, please contact night coverage [email or web address]

## 2023-07-16 NOTE — Progress Notes (Signed)
Echocardiogram 2D Echocardiogram has been performed.  Andrea Griffin 07/16/2023, 10:53 PM

## 2023-07-16 NOTE — Plan of Care (Signed)

## 2023-07-16 NOTE — Consult Note (Signed)
Pharmacy Consult Note - Anticoagulation  Pharmacy Consult for heparin Indication: chest pain/ACS  PATIENT MEASUREMENTS: Height: 5\' 2"  (157.5 cm) Weight: 69.3 kg (152 lb 11.2 oz) IBW/kg (Calculated) : 50.1 HEPARIN DW (KG): 65.2  VITAL SIGNS: Temp: 98.4 F (36.9 C) (11/15 1430) Temp Source: Oral (11/15 1430) BP: 142/76 (11/15 1732) Pulse Rate: 116 (11/15 1732)  Recent Labs    07/14/23 1650 07/14/23 1653 07/15/23 0026 07/15/23 0608 07/15/23 0941 07/16/23 0538  HGB  --   --   --  11.0*  --  11.5*  HCT  --   --   --  31.6*  --  33.9*  PLT  --   --   --  277  --  305  APTT  --    < > 75*  --   --   --   LABPROT  --    < >  --  15.0  --   --   INR  --    < >  --  1.2  --   --   HEPARINUNFRC  --    < > 0.13*  --    < > 0.53  CREATININE  --   --   --  <0.30*  --  0.42*  TROPONINIHS 553*  --   --   --   --   --    < > = values in this interval not displayed.    Estimated Creatinine Clearance: 43.5 mL/min (A) (by C-G formula based on SCr of 0.42 mg/dL (L)).  PAST MEDICAL HISTORY: Past Medical History:  Diagnosis Date   Anxiety    Arthritis    Asthma    Cancer (HCC)    melanoma skin cancer on head   Diastolic dysfunction    a. 05/2023 Echo: EF 70-76^, no rwma, GrII DD, nl RV fxn, mildly dil RA, mod dil LA. Mild MR. Mod AI.   GERD (gastroesophageal reflux disease)    History of recurrent cystitis    Hyperlipidemia    Hypertension    Infection of prosthetic hip joint (HCC)    Mitral regurgitation    Moderate aortic insufficiency    Nocturia    Obesity    Osteopenia    Persistent atrial fibrillation (HCC)    Thyroid disease    Urinary frequency    Urinary incontinence    Urinary urgency    Wears glasses     ASSESSMENT: 89 y.o. female with PMH including Afib, HTN, asthma is presenting with shortness of breath which has been ongoing for a year. Patient is not on chronic anticoagulation per chart review. Recently was prescribed apixaban 2.5 mg twice daily for NOAF  but patient has not started the medication yet. Pharmacy has been consulted to initiate and manage heparin intravenous infusion.  Pertinent medications: Prescribed Eliquis 2.5 mg twice daily but reportedly has not picked up yet  Goal(s) of therapy: Heparin level 0.3 - 0.7 units/mL Monitor platelets by anticoagulation protocol: Yes   Baseline anticoagulation labs: Recent Labs    07/14/23 1427 07/14/23 1653 07/15/23 0026 07/15/23 0608 07/16/23 0538  APTT  --  41* 75*  --   --   INR  --  1.2  --  1.2  --   HGB 11.9*  --   --  11.0* 11.5*  PLT 300  --   --  277 305    11/15 1410 heparin drip stopped (pt to cath lab) 11/15 2000 heparin drip to restart (2 hrs after  band removal)  PLAN:  REstart heparin infusion at 1300 units/hour at 2000 check heparin level in 8 hours Monitor CBC daily while on heparin infusion.  Bari Mantis PharmD Clinical Pharmacist 07/16/2023

## 2023-07-16 NOTE — Consult Note (Signed)
Pharmacy Consult Note - Anticoagulation  Pharmacy Consult for heparin Indication: chest pain/ACS  PATIENT MEASUREMENTS: Height: 5\' 2"  (157.5 cm) Weight: 69.3 kg (152 lb 11.2 oz) IBW/kg (Calculated) : 50.1 HEPARIN DW (KG): 65.2  VITAL SIGNS: Temp: 98.4 F (36.9 C) (11/15 0444) Temp Source: Oral (11/14 2340) BP: 140/82 (11/15 0444) Pulse Rate: 93 (11/15 0444)  Recent Labs    07/14/23 1650 07/14/23 1653 07/15/23 0026 07/15/23 0608 07/15/23 0941 07/16/23 0538  HGB  --   --   --  11.0*  --  11.5*  HCT  --   --   --  31.6*  --  33.9*  PLT  --   --   --  277  --  305  APTT  --    < > 75*  --   --   --   LABPROT  --    < >  --  15.0  --   --   INR  --    < >  --  1.2  --   --   HEPARINUNFRC  --    < > 0.13*  --    < > 0.53  CREATININE  --   --   --  <0.30*  --  0.42*  TROPONINIHS 553*  --   --   --   --   --    < > = values in this interval not displayed.    Estimated Creatinine Clearance: 43.5 mL/min (A) (by C-G formula based on SCr of 0.42 mg/dL (L)).  PAST MEDICAL HISTORY: Past Medical History:  Diagnosis Date   Anxiety    Arthritis    Asthma    Cancer (HCC)    melanoma skin cancer on head   Diastolic dysfunction    a. 05/2023 Echo: EF 70-76^, no rwma, GrII DD, nl RV fxn, mildly dil RA, mod dil LA. Mild MR. Mod AI.   GERD (gastroesophageal reflux disease)    History of recurrent cystitis    Hyperlipidemia    Hypertension    Infection of prosthetic hip joint (HCC)    Mitral regurgitation    Moderate aortic insufficiency    Nocturia    Obesity    Osteopenia    Persistent atrial fibrillation (HCC)    Thyroid disease    Urinary frequency    Urinary incontinence    Urinary urgency    Wears glasses     ASSESSMENT: 89 y.o. female with PMH including Afib, HTN, asthma is presenting with shortness of breath which has been ongoing for a year. Patient is not on chronic anticoagulation per chart review. Recently was prescribed apixaban 2.5 mg twice daily for NOAF but  patient has not started the medication yet. Pharmacy has been consulted to initiate and manage heparin intravenous infusion.  Pertinent medications: Prescribed Eliquis 2.5 mg twice daily but reportedly has not picked up yet  Goal(s) of therapy: Heparin level 0.3 - 0.7 units/mL Monitor platelets by anticoagulation protocol: Yes   Baseline anticoagulation labs: Recent Labs    07/14/23 1427 07/14/23 1653 07/15/23 0026 07/15/23 0608 07/16/23 0538  APTT  --  41* 75*  --   --   INR  --  1.2  --  1.2  --   HGB 11.9*  --   --  11.0* 11.5*  PLT 300  --   --  277 305    PLAN: Heparin level therapeutic x 1 Continue heparin infusion at 1300 units/hour Recheck heparin level in 8 hours  to confirm Monitor CBC daily while on heparin infusion.  Otelia Sergeant, PharmD, [facility] 07/16/2023 6:51 AM

## 2023-07-16 NOTE — Progress Notes (Signed)
Cardiology Progress Note   Patient Name: Andrea Griffin Date of Encounter: 07/16/2023  Primary Cardiologist: Julien Nordmann, MD  Subjective   Remains in afib, rates 90-110.  No c/p, dyspnea, palpitations.  Family at bedside.  Discussed cath in detail. Objective   Inpatient Medications    Scheduled Meds:  aspirin  81 mg Oral Daily   atorvastatin  80 mg Oral Daily   calcium-vitamin D  2 tablet Oral Daily   methimazole  2.5 mg Oral Daily   metoprolol succinate  50 mg Oral Daily   pantoprazole  40 mg Oral Daily   potassium chloride  20 mEq Oral Once   senna-docusate  2 tablet Oral Daily   Continuous Infusions:  azithromycin 500 mg (07/15/23 2141)   cefTRIAXone (ROCEPHIN)  IV 2 g (07/15/23 1846)   heparin 1,300 Units/hr (07/15/23 2138)   PRN Meds: acetaminophen **OR** acetaminophen, morphine injection, nitroGLYCERIN, ondansetron **OR** ondansetron (ZOFRAN) IV, polyethylene glycol, traZODone   Vital Signs    Vitals:   07/15/23 2025 07/15/23 2340 07/16/23 0444 07/16/23 0829  BP: 130/83 111/82 (!) 140/82 (!) 144/73  Pulse: 92 83 93 (!) 114  Resp: 18 18 18  (!) 22  Temp: 100.1 F (37.8 C) 99.8 F (37.7 C) 98.4 F (36.9 C) 98.3 F (36.8 C)  TempSrc: Oral Oral Oral Oral  SpO2: 93% 93% 95% 94%  Weight:      Height:        Intake/Output Summary (Last 24 hours) at 07/16/2023 1053 Last data filed at 07/16/2023 0500 Gross per 24 hour  Intake 756.8 ml  Output --  Net 756.8 ml   Filed Weights   07/14/23 1503 07/14/23 2156  Weight: 71.2 kg 69.3 kg    Physical Exam   GEN: Well nourished, well developed, in no acute distress.  HEENT: Grossly normal.  Neck: Supple, no JVD, carotid bruits, or masses. Cardiac: IR, IR, no murmurs, rubs, or gallops. No clubbing, cyanosis, edema.  Radials 2+, DP/PT 2+ and equal bilaterally.  Respiratory:  Respirations regular and unlabored, clear to auscultation bilaterally. GI: Soft, nontender, nondistended, BS + x 4. MS: no deformity  or atrophy. Skin: warm and dry, no rash. Neuro:  Strength and sensation are intact. Psych: AAOx3.  Normal affect.  Labs    Chemistry Recent Labs  Lab 07/14/23 1156 07/14/23 1427 07/15/23 0608 07/16/23 0538  NA  --  127* 129* 130*  K  --  4.0 3.3* 3.8  CL  --  94* 95* 98  CO2  --  22 22 21*  GLUCOSE  --  130* 91 90  BUN  --  14 9 8   CREATININE  --  0.55 <0.30* 0.42*  CALCIUM  --  8.2* 8.2* 8.3*  PROT 6.7  --   --   --   ALBUMIN 3.2*  --   --   --   AST 39  --   --   --   ALT 34  --   --   --   ALKPHOS 110  --   --   --   BILITOT 0.7  --   --   --   GFRNONAA  --  >60 NOT CALCULATED >60  ANIONGAP  --  11 12 11      Hematology Recent Labs  Lab 07/14/23 1427 07/15/23 0608 07/16/23 0538  WBC 8.3 7.8 8.2  RBC 3.62* 3.40* 3.56*  HGB 11.9* 11.0* 11.5*  HCT 35.3* 31.6* 33.9*  MCV 97.5 92.9 95.2  MCH  32.9 32.4 32.3  MCHC 33.7 34.8 33.9  RDW 12.3 12.3 12.3  PLT 300 277 305    Cardiac Enzymes  Recent Labs  Lab 07/14/23 1156 07/14/23 1427 07/14/23 1650  TROPONINIHS 613* 605* 553*      BNP    Component Value Date/Time   BNP 105 03/25/2012 1202   Radiology    CT Angio Chest PE W and/or Wo Contrast  Result Date: 07/14/2023 CLINICAL DATA:  Pulmonary embolism (PE) suspected, high prob. Patient c/o SOB. Seen today and labs drawn. Troponin elevated. EXAM: CT ANGIOGRAPHY CHEST WITH CONTRAST TECHNIQUE: Multidetector CT imaging of the chest was performed using the standard protocol during bolus administration of intravenous contrast. Multiplanar CT image reconstructions and MIPs were obtained to evaluate the vascular anatomy. RADIATION DOSE REDUCTION: This exam was performed according to the departmental dose-optimization program which includes automated exposure control, adjustment of the mA and/or kV according to patient size and/or use of iterative reconstruction technique. CONTRAST:  75mL OMNIPAQUE IOHEXOL 350 MG/ML SOLN FINDINGS: Cardiovascular: Satisfactory  opacification of the pulmonary arteries to the segmental level. No evidence of pulmonary embolism. The main pulmonary artery is normal in caliber. Enlarged heart size. No significant pericardial effusion. The thoracic aorta is normal in caliber. No atherosclerotic plaque of the thoracic aorta. Left anterior descending coronary artery calcifications. Aortic valve leaflet calcification Mediastinum/Nodes: No enlarged mediastinal, hilar, or axillary lymph nodes. Thyroid gland, trachea, and esophagus demonstrate no significant findings. Lungs/Pleura: Chronic peripheral reticulations in trace cystic changes most prominent along the left upper lobe. Interval development of superimposed patchy airspace opacity of left lower lobe (6:55). No pulmonary nodule. No pulmonary mass. No pleural effusion. No pneumothorax. Upper Abdomen: Mild atherosclerotic plaque. No acute abnormality with limited evaluation on this noncontrast study. Musculoskeletal: No chest wall abnormality. No suspicious lytic or blastic osseous lesions. No acute displaced fracture. Review of the MIP images confirms the above findings. IMPRESSION: 1. No pulmonary embolus. 2. Mild pulmonary fibrosis/interstitial lung disease with developing infection/inflammation of the left upper lobe-recommend follow-up CT in 3 months to evaluate for resolution. 3. Cardiomegaly. 4. Aortic Atherosclerosis (ICD10-I70.0) including left anterior descending and aortic valve leaflet calcifications-correlate for aortic stenosis. Electronically Signed   By: Tish Frederickson M.D.   On: 07/14/2023 18:13   DG Chest 2 View  Result Date: 07/14/2023 CLINICAL DATA:  Shortness of breath.  Elevated troponin. EXAM: CHEST - 2 VIEW COMPARISON:  Chest radiograph dated 05/04/2023 FINDINGS: There is mild diffuse chronic intra coarsening. No focal consolidation, pleural effusion, or pneumothorax. Top-normal cardiac size. Degenerative changes of the spine. No acute osseous pathology. IMPRESSION:  No active cardiopulmonary disease. Electronically Signed   By: Elgie Collard M.D.   On: 07/14/2023 16:50     Telemetry    Afib, 90-1 teens - Personally Reviewed  Cardiac Studies   2D Echocardiogram 9.2024  1. Left ventricular ejection fraction, by estimation, is 70 to 75%. The  left ventricle has hyperdynamic function. The left ventricle has no  regional wall motion abnormalities. Left ventricular diastolic parameters  are consistent with Grade II diastolic  dysfunction (pseudonormalization).   2. Right ventricular systolic function is normal. The right ventricular  size is mildly enlarged.   3. Left atrial size was moderately dilated.   4. Right atrial size was mildly dilated.   5. The mitral valve is myxomatous. Mild mitral valve regurgitation.   6. The aortic valve is normal in structure. Aortic valve regurgitation is  moderate.  _____________  2D Echocardiogram - limited echo  pending today _____________   Patient Profile     89 y.o. female with a history of HTN, HL, hyperthyroidism, anxiety, and GERD, who is being seen today for the evaluation of Afib and NSTEMI - admit 11/13.  Assessment & Plan    1.  Afib w/ RVR:  Anxiety-like sensation and palpitations x several wks.  Also w/ rest and exertional chest tightness and dyspnea. Found to be in afib @ PCP visit earlier this week - currently asymptomatic. On ? blocker, rates 90's to 1-teens over past 24 hrs. Pressure stable.  Will increase metoprolol succ  to 50 BID.  Cont heparin for time being.  Cath today w/ plan to transition to eliquis 5 bid afterward, if appropriate.  2.  NSTEMI:  Patient has been experiencing resting exertional substernal chest heaviness associate with dyspnea. On arrival, troponin was 613 and has since trended in a downward fashion. CT chest w/ LAD and Ao atherosclerosis.  Currently c/p free.  Discussed role of diagnostic catheterization in setting of progressive symptoms and enzymatic abnormalities.   The patient understands that risks include but are not limited to stroke (1 in 1000), death (1 in 1000), kidney failure [usually temporary] (1 in 500), bleeding (1 in 200), allergic reaction [possibly serious] (1 in 200), and agrees to proceed.  Cont asa, statin, ? blocker, heparin.  3.  HL:  LDL 62.  Cont statin.  4.  Hyperthyroidism:  Nl TSH w/ mildly elevated FT4.  On methimazole. Cont ? blocker.   5.  Hypokalemia:  3.8 this AM. Cont supplementation.  6.  UTI:  Abx per IM.  Signed, Nicolasa Ducking, NP  07/16/2023, 10:53 AM    For questions or updates, please contact   Please consult [email or web address] for contact info under Cardiology/STEMI.

## 2023-07-17 DIAGNOSIS — I214 Non-ST elevation (NSTEMI) myocardial infarction: Secondary | ICD-10-CM | POA: Diagnosis not present

## 2023-07-17 LAB — HEPARIN LEVEL (UNFRACTIONATED): Heparin Unfractionated: 0.36 [IU]/mL (ref 0.30–0.70)

## 2023-07-17 LAB — ECHOCARDIOGRAM LIMITED
AR max vel: 2.61 cm2
AV Area VTI: 2.58 cm2
AV Area mean vel: 2.52 cm2
AV Mean grad: 7.5 mm[Hg]
AV Peak grad: 13.2 mm[Hg]
Ao pk vel: 1.82 m/s
Area-P 1/2: 4.96 cm2
Height: 62 in
S' Lateral: 2.7 cm
Weight: 2443.2 [oz_av]

## 2023-07-17 LAB — CBC
HCT: 33.6 % — ABNORMAL LOW (ref 36.0–46.0)
Hemoglobin: 11.4 g/dL — ABNORMAL LOW (ref 12.0–15.0)
MCH: 32.4 pg (ref 26.0–34.0)
MCHC: 33.9 g/dL (ref 30.0–36.0)
MCV: 95.5 fL (ref 80.0–100.0)
Platelets: 332 10*3/uL (ref 150–400)
RBC: 3.52 MIL/uL — ABNORMAL LOW (ref 3.87–5.11)
RDW: 12.3 % (ref 11.5–15.5)
WBC: 7 10*3/uL (ref 4.0–10.5)
nRBC: 0 % (ref 0.0–0.2)

## 2023-07-17 LAB — BASIC METABOLIC PANEL
Anion gap: 11 (ref 5–15)
BUN: 8 mg/dL (ref 8–23)
CO2: 21 mmol/L — ABNORMAL LOW (ref 22–32)
Calcium: 8.2 mg/dL — ABNORMAL LOW (ref 8.9–10.3)
Chloride: 98 mmol/L (ref 98–111)
Creatinine, Ser: 0.41 mg/dL — ABNORMAL LOW (ref 0.44–1.00)
GFR, Estimated: >60 mL/min (ref >60)
Glucose, Bld: 87 mg/dL (ref 70–99)
Potassium: 3.6 mmol/L (ref 3.5–5.1)
Sodium: 130 mmol/L — ABNORMAL LOW (ref 135–145)

## 2023-07-17 LAB — URINE CULTURE: Culture: >=100000 — AB

## 2023-07-17 MED ORDER — APIXABAN 5 MG PO TABS
5.0000 mg | ORAL_TABLET | Freq: Two times a day (BID) | ORAL | Status: DC
  Administered 2023-07-17: 5 mg via ORAL
  Filled 2023-07-17: qty 1

## 2023-07-17 MED ORDER — CEFDINIR 300 MG PO CAPS: 300.0000 mg | ORAL_CAPSULE | Freq: Two times a day (BID) | ORAL | 0 refills | Status: AC

## 2023-07-17 MED ORDER — APIXABAN 5 MG PO TABS: 5.0000 mg | ORAL_TABLET | Freq: Two times a day (BID) | ORAL | 0 refills | Status: DC

## 2023-07-17 MED ORDER — ATORVASTATIN CALCIUM 40 MG PO TABS: 40.0000 mg | ORAL_TABLET | Freq: Every day | ORAL | 0 refills | Status: AC

## 2023-07-17 MED ORDER — METOPROLOL SUCCINATE ER 50 MG PO TB24
50.0000 mg | ORAL_TABLET | Freq: Once | ORAL | Status: AC
  Administered 2023-07-17: 50 mg via ORAL
  Filled 2023-07-17: qty 1

## 2023-07-17 MED ORDER — METOPROLOL SUCCINATE ER 100 MG PO TB24: 100.0000 mg | ORAL_TABLET | Freq: Two times a day (BID) | ORAL | 0 refills | Status: DC

## 2023-07-17 MED ORDER — MENTHOL 3 MG MT LOZG
1.0000 | LOZENGE | OROMUCOSAL | Status: DC | PRN
Start: 2023-07-17 — End: 2023-07-17

## 2023-07-17 MED ORDER — METOPROLOL SUCCINATE ER 100 MG PO TB24: 100.0000 mg | ORAL_TABLET | Freq: Two times a day (BID) | ORAL | Status: DC

## 2023-07-17 NOTE — Plan of Care (Signed)
  Problem: Fluid Volume: Goal: Hemodynamic stability will improve Outcome: Progressing   Problem: Clinical Measurements: Goal: Diagnostic test results will improve Outcome: Progressing Goal: Signs and symptoms of infection will decrease Outcome: Progressing   Problem: Respiratory: Goal: Ability to maintain adequate ventilation will improve Outcome: Progressing   Problem: Education: Goal: Knowledge of General Education information will improve Description: Including pain rating scale, medication(s)/side effects and non-pharmacologic comfort measures Outcome: Progressing   Problem: Health Behavior/Discharge Planning: Goal: Ability to manage health-related needs will improve Outcome: Progressing   Problem: Clinical Measurements: Goal: Ability to maintain clinical measurements within normal limits will improve Outcome: Progressing Goal: Will remain free from infection Outcome: Progressing Goal: Diagnostic test results will improve Outcome: Progressing Goal: Respiratory complications will improve Outcome: Progressing Goal: Cardiovascular complication will be avoided Outcome: Progressing   Problem: Activity: Goal: Risk for activity intolerance will decrease Outcome: Progressing   Problem: Nutrition: Goal: Adequate nutrition will be maintained Outcome: Progressing   Problem: Coping: Goal: Level of anxiety will decrease Outcome: Progressing   Problem: Elimination: Goal: Will not experience complications related to bowel motility Outcome: Progressing Goal: Will not experience complications related to urinary retention Outcome: Progressing   Problem: Pain Management: Goal: General experience of comfort will improve Outcome: Progressing   Problem: Safety: Goal: Ability to remain free from injury will improve Outcome: Progressing   Problem: Skin Integrity: Goal: Risk for impaired skin integrity will decrease Outcome: Progressing   Problem: Activity: Goal: Ability  to return to baseline activity level will improve Outcome: Progressing   Problem: Cardiovascular: Goal: Ability to achieve and maintain adequate cardiovascular perfusion will improve Outcome: Progressing Goal: Vascular access site(s) Level 0-1 will be maintained Outcome: Progressing   Problem: Health Behavior/Discharge Planning: Goal: Ability to safely manage health-related needs after discharge will improve Outcome: Progressing

## 2023-07-17 NOTE — Consult Note (Signed)
Pharmacy Consult Note - Anticoagulation  Pharmacy Consult for heparin Indication: chest pain/ACS  PATIENT MEASUREMENTS: Height: 5\' 2"  (157.5 cm) Weight: 69.3 kg (152 lb 11.2 oz) IBW/kg (Calculated) : 50.1 HEPARIN DW (KG): 65.2  VITAL SIGNS: Temp: 98 F (36.7 C) (11/16 0424) Temp Source: Oral (11/16 0424) BP: 133/67 (11/16 0424) Pulse Rate: 99 (11/16 0424)  Recent Labs    07/14/23 1650 07/14/23 1653 07/15/23 0026 07/15/23 0608 07/15/23 0941 07/17/23 0532  HGB  --   --   --  11.0*   < > 11.4*  HCT  --   --   --  31.6*   < > 33.6*  PLT  --   --   --  277   < > 332  APTT  --    < > 75*  --   --   --   LABPROT  --    < >  --  15.0  --   --   INR  --    < >  --  1.2  --   --   HEPARINUNFRC  --    < > 0.13*  --    < > 0.36  CREATININE  --   --   --  <0.30*   < > 0.41*  TROPONINIHS 553*  --   --   --   --   --    < > = values in this interval not displayed.    Estimated Creatinine Clearance: 43.5 mL/min (A) (by C-G formula based on SCr of 0.41 mg/dL (L)).  PAST MEDICAL HISTORY: Past Medical History:  Diagnosis Date   Anxiety    Arthritis    Asthma    Cancer (HCC)    melanoma skin cancer on head   Diastolic dysfunction    a. 05/2023 Echo: EF 70-76^, no rwma, GrII DD, nl RV fxn, mildly dil RA, mod dil LA. Mild MR. Mod AI.   GERD (gastroesophageal reflux disease)    History of recurrent cystitis    Hyperlipidemia    Hypertension    Infection of prosthetic hip joint (HCC)    Mitral regurgitation    Moderate aortic insufficiency    Nocturia    Obesity    Osteopenia    Persistent atrial fibrillation (HCC)    Thyroid disease    Urinary frequency    Urinary incontinence    Urinary urgency    Wears glasses     ASSESSMENT: 89 y.o. female with PMH including Afib, HTN, asthma is presenting with shortness of breath which has been ongoing for a year. Patient is not on chronic anticoagulation per chart review. Recently was prescribed apixaban 2.5 mg twice daily for NOAF  but patient has not started the medication yet. Pharmacy has been consulted to initiate and manage heparin intravenous infusion.  Pertinent medications: Prescribed Eliquis 2.5 mg twice daily but reportedly has not picked up yet  Goal(s) of therapy: Heparin level 0.3 - 0.7 units/mL Monitor platelets by anticoagulation protocol: Yes   Baseline anticoagulation labs: Recent Labs    07/14/23 1653 07/15/23 0026 07/15/23 0608 07/16/23 0538 07/17/23 0532  APTT 41* 75*  --   --   --   INR 1.2  --  1.2  --   --   HGB  --   --  11.0* 11.5* 11.4*  PLT  --   --  277 305 332    11/15 1410 heparin drip stopped (pt to cath lab) 11/15 2000 heparin drip to  restart (2 hrs after band removal) 11/16 0532 HL 0.36, therapeutic x 1  PLAN: Continue heparin infusion at 1300 units/hour Recheck heparin level in 8 hours to confirm Monitor CBC daily while on heparin infusion.  Otelia Sergeant, PharmD, [facility] 07/17/2023 6:47 AM

## 2023-07-17 NOTE — Consult Note (Signed)
PHARMACY CONSULT NOTE - ELECTROLYTES  Pharmacy Consult for Electrolyte Monitoring and Replacement   Recent Labs: Height: 5\' 2"  (157.5 cm) Weight: 69.3 kg (152 lb 11.2 oz) IBW/kg (Calculated) : 50.1 Estimated Creatinine Clearance: 43.5 mL/min (A) (by C-G formula based on SCr of 0.41 mg/dL (L)). Potassium (mmol/L)  Date Value  07/17/2023 3.6  03/28/2014 4.2   Magnesium (mg/dL)  Date Value  16/06/9603 1.8  05/03/2012 1.3 (L)   Calcium (mg/dL)  Date Value  54/05/8118 8.2 (L)   Calcium, Total (mg/dL)  Date Value  14/78/2956 7.8 (L)   Albumin (g/dL)  Date Value  21/30/8657 3.2 (L)  10/16/2012 3.0 (L)   Phosphorus (mg/dL)  Date Value  84/69/6295 4.0   Sodium (mmol/L)  Date Value  07/17/2023 130 (L)  03/28/2014 137   Assessment  Andrea Griffin is a 89 y.o. female presenting with palpitations and chest pain. PMH significant for hypertension, dyslipidemia, asthma, GERD, hyperthyroidism and anxiety . Pharmacy has been consulted to monitor and replace electrolytes.  Goal of Therapy: Electrolytes WNL  Plan:  No electrolyte replacement warranted for today Check BMP with AM labs  Thank you for allowing pharmacy to be a part of this patient's care.  Burnis Medin, PharmD, BCPS Clinical Pharmacist 07/17/2023 12:49 PM

## 2023-07-17 NOTE — Progress Notes (Signed)
Cardiology Progress Note   Patient Name: Andrea Griffin Date of Encounter: 07/17/2023  Primary Cardiologist: Julien Nordmann, MD  Subjective   Feels well this AM.  No c/p or palps. Tolerated cath well.  Rates still 90's top 120's.  Family @ bedside.  Questions answered. Objective   Inpatient Medications    Scheduled Meds:  aspirin  81 mg Oral Daily   atorvastatin  40 mg Oral Daily   calcium-vitamin D  2 tablet Oral Daily   methimazole  2.5 mg Oral Daily   metoprolol succinate  100 mg Oral BID   metoprolol succinate  50 mg Oral Once   pantoprazole  40 mg Oral Daily   potassium chloride  20 mEq Oral Once   senna-docusate  2 tablet Oral Daily   sodium chloride flush  3 mL Intravenous Q12H   Continuous Infusions:  sodium chloride     cefTRIAXone (ROCEPHIN)  IV 2 g (07/16/23 1824)   heparin 1,300 Units/hr (07/16/23 2101)   PRN Meds: sodium chloride, acetaminophen **OR** acetaminophen, morphine injection, nitroGLYCERIN, ondansetron **OR** ondansetron (ZOFRAN) IV, polyethylene glycol, sodium chloride flush, traZODone   Vital Signs    Vitals:   07/16/23 2115 07/16/23 2359 07/17/23 0424 07/17/23 0732  BP: (!) 154/70 127/76 133/67 134/75  Pulse:  (!) 108 99 94  Resp:  20  20  Temp:  98.5 F (36.9 C) 98 F (36.7 C) 98.7 F (37.1 C)  TempSrc:  Oral Oral Oral  SpO2:  96% 94% 95%  Weight:      Height:        Intake/Output Summary (Last 24 hours) at 07/17/2023 1144 Last data filed at 07/17/2023 1127 Gross per 24 hour  Intake 600.62 ml  Output 200 ml  Net 400.62 ml   Filed Weights   07/14/23 1503 07/14/23 2156  Weight: 71.2 kg 69.3 kg    Physical Exam   GEN: Well nourished, well developed, in no acute distress.  HEENT: Grossly normal.  Neck: Supple, no JVD, carotid bruits, or masses. Cardiac: IR, IR, no murmurs, rubs, or gallops. No clubbing, cyanosis, edema.  Radials 2+, DP/PT 2+ and equal bilaterally.  R radial cath site w/o  bleeding/bruit/hematoma. Respiratory:  Respirations regular and unlabored, clear to auscultation bilaterally. GI: Soft, nontender, nondistended, BS + x 4. MS: no deformity or atrophy. Skin: warm and dry, no rash. Neuro:  Strength and sensation are intact. Psych: AAOx3.  Normal affect.  Labs    Chemistry Recent Labs  Lab 07/14/23 1156 07/14/23 1427 07/15/23 0608 07/16/23 0538 07/17/23 0532  NA  --    < > 129* 130* 130*  K  --    < > 3.3* 3.8 3.6  CL  --    < > 95* 98 98  CO2  --    < > 22 21* 21*  GLUCOSE  --    < > 91 90 87  BUN  --    < > 9 8 8   CREATININE  --    < > <0.30* 0.42* 0.41*  CALCIUM  --    < > 8.2* 8.3* 8.2*  PROT 6.7  --   --   --   --   ALBUMIN 3.2*  --   --   --   --   AST 39  --   --   --   --   ALT 34  --   --   --   --   ALKPHOS 110  --   --   --   --  BILITOT 0.7  --   --   --   --   GFRNONAA  --    < > NOT CALCULATED >60 >60  ANIONGAP  --    < > 12 11 11    < > = values in this interval not displayed.     Hematology Recent Labs  Lab 07/15/23 0608 07/16/23 0538 07/17/23 0532  WBC 7.8 8.2 7.0  RBC 3.40* 3.56* 3.52*  HGB 11.0* 11.5* 11.4*  HCT 31.6* 33.9* 33.6*  MCV 92.9 95.2 95.5  MCH 32.4 32.3 32.4  MCHC 34.8 33.9 33.9  RDW 12.3 12.3 12.3  PLT 277 305 332    Cardiac Enzymes  Recent Labs  Lab 07/14/23 1156 07/14/23 1427 07/14/23 1650  TROPONINIHS 613* 605* 553*      BNP    Component Value Date/Time   BNP 105 03/25/2012 1202   Radiology     CT Angio Chest PE W and/or Wo Contrast  Result Date: 07/14/2023 CLINICAL DATA:  Pulmonary embolism (PE) suspected, high prob. Patient c/o SOB. Seen today and labs drawn. Troponin elevated. EXAM: CT ANGIOGRAPHY CHEST WITH CONTRAST TECHNIQUE: Multidetector CT imaging of the chest was performed using the standard protocol during bolus administration of intravenous contrast. Multiplanar CT image reconstructions and MIPs were obtained to evaluate the vascular anatomy. RADIATION DOSE REDUCTION:  This exam was performed according to the departmental dose-optimization program which includes automated exposure control, adjustment of the mA and/or kV according to patient size and/or use of iterative reconstruction technique. CONTRAST:  75mL OMNIPAQUE IOHEXOL 350 MG/ML SOLN FINDINGS: Cardiovascular: Satisfactory opacification of the pulmonary arteries to the segmental level. No evidence of pulmonary embolism. The main pulmonary artery is normal in caliber. Enlarged heart size. No significant pericardial effusion. The thoracic aorta is normal in caliber. No atherosclerotic plaque of the thoracic aorta. Left anterior descending coronary artery calcifications. Aortic valve leaflet calcification Mediastinum/Nodes: No enlarged mediastinal, hilar, or axillary lymph nodes. Thyroid gland, trachea, and esophagus demonstrate no significant findings. Lungs/Pleura: Chronic peripheral reticulations in trace cystic changes most prominent along the left upper lobe. Interval development of superimposed patchy airspace opacity of left lower lobe (6:55). No pulmonary nodule. No pulmonary mass. No pleural effusion. No pneumothorax. Upper Abdomen: Mild atherosclerotic plaque. No acute abnormality with limited evaluation on this noncontrast study. Musculoskeletal: No chest wall abnormality. No suspicious lytic or blastic osseous lesions. No acute displaced fracture. Review of the MIP images confirms the above findings. IMPRESSION: 1. No pulmonary embolus. 2. Mild pulmonary fibrosis/interstitial lung disease with developing infection/inflammation of the left upper lobe-recommend follow-up CT in 3 months to evaluate for resolution. 3. Cardiomegaly. 4. Aortic Atherosclerosis (ICD10-I70.0) including left anterior descending and aortic valve leaflet calcifications-correlate for aortic stenosis. Electronically Signed   By: Tish Frederickson M.D.   On: 07/14/2023 18:13   DG Chest 2 View  Result Date: 07/14/2023 CLINICAL DATA:  Shortness  of breath.  Elevated troponin. EXAM: CHEST - 2 VIEW COMPARISON:  Chest radiograph dated 05/04/2023 FINDINGS: There is mild diffuse chronic intra coarsening. No focal consolidation, pleural effusion, or pneumothorax. Top-normal cardiac size. Degenerative changes of the spine. No acute osseous pathology. IMPRESSION: No active cardiopulmonary disease. Electronically Signed   By: Elgie Collard M.D.   On: 07/14/2023 16:50     Telemetry    Afib, 90-1 teens - Personally Reviewed  Cardiac Studies   2D Echocardiogram 9.2024  1. Left ventricular ejection fraction, by estimation, is 70 to 75%. The  left ventricle has hyperdynamic function. The left  ventricle has no  regional wall motion abnormalities. Left ventricular diastolic parameters  are consistent with Grade II diastolic  dysfunction (pseudonormalization).   2. Right ventricular systolic function is normal. The right ventricular  size is mildly enlarged.   3. Left atrial size was moderately dilated.   4. Right atrial size was mildly dilated.   5. The mitral valve is myxomatous. Mild mitral valve regurgitation.   6. The aortic valve is normal in structure. Aortic valve regurgitation is  moderate.  _____________  2D Echocardiogram - limited echo pending _____________   Cardiac Catheterization  11.15.2024  Left Main  Vessel is angiographically normal.    Left Anterior Descending  The vessel exhibits minimal luminal irregularities.    Ramus Intermedius  Vessel is angiographically normal.    Left Circumflex  Vessel is angiographically normal.    Right Coronary Artery  Vessel is angiographically normal.    Right Posterior Descending Artery  Vessel is angiographically normal.    Right Posterior Atrioventricular Artery  Vessel is angiographically normal.    _____________   Patient Profile     89 y.o. female with a history of HTN, HL, hyperthyroidism, anxiety, and GERD, who is being seen today for the evaluation of Afib  and NSTEMI - admit 11/13.  Assessment & Plan    1.  Afib w/ RVR:  Anxiety-like sensation and palpitations x several wks.  Also w/ rest and exertional chest tightness and dyspnea. Found to be in afib @ PCP visit earlier this week - remains asymptomatic. Rates still up into the 120's.  Titrating ? blocker to 100 bid.  Will transition to eliquis.  If rates improved this afternoon, she can likely be discharged.  She has follow-up with our electrophysiology team next Thursday.  Presuming she remains in atrial fibrillation, she will require cardioversion after 3 weeks of therapeutic anticoagulation.  2.  NSTEMI: Several month history of exertional chest pain and dyspnea.  On arrival, troponin was 613 and has since trended in a downward fashion. CT chest w/ LAD and Ao atherosclerosis.  Diagnostic catheterization with minimal nonobstructive disease.  In that setting, suspect supply:demand ischemia related to #1.  No chest pain this morning.  Continue beta-blocker and statin.  Will discontinue aspirin in the setting of Eliquis.  3.  HL:  LDL 62.  Cont statin.  4.  Hyperthyroidism:  Nl TSH w/ mildly elevated FT4.  On methimazole. Cont ? blocker.   5.  Hypokalemia:  3.6 this AM.  It appears that she will continue to require supplementation in the outpatient setting despite not being on a diuretic.  6.  UTI:  Abx per IM.  Signed, Nicolasa Ducking, NP  07/17/2023, 11:44 AM    For questions or updates, please contact   Please consult [email or web address] for contact info under Cardiology/STEMI.

## 2023-07-17 NOTE — Discharge Summary (Signed)
Physician Discharge Summary   Patient: Andrea Griffin MRN: [id number]  DOB: [birth year]   Admit:     Date of Admission: 07/14/2023 Admitted from: home   Discharge: Date of discharge: 07/17/23 Disposition: Home Condition at discharge: good  CODE STATUS: DNR     Discharge Physician: Sunnie Nielsen, DO [facility]     PCP: Marisue Ivan, MD  Recommendations for Outpatient Follow-up:  Follow up with PCP Marisue Ivan, MD in 1-2 weeks Keep cardiology visit 11/21 or call to find out if they can see you sooner  Please obtain labs/tests: CBC, BMP, TSH in 1-2 weeks Please follow up on the following pending results: none PCP AND OTHER OUTPATIENT PROVIDERS: SEE BELOW FOR SPECIFIC DISCHARGE INSTRUCTIONS PRINTED FOR PATIENT IN ADDITION TO GENERIC AVS PATIENT INFO    Discharge Instructions     Diet - low sodium heart healthy   Complete by: As directed    Discharge instructions   Complete by: As directed    HEART RATE IS STILL A LITTLE BIT HIGH WITH WALKING, BUT OVERALL MUCH BETTER AND IT IS REASSURING THAT YOU ARE NOT EXPERIENCING CHEST PAIN OR PALPITATIONS. OK FOR DISCHARGE HOME - PLEASE BE SURE TO READ ALL INSTRUCTIONS, PLEASE SEEK MEDICAL CARE ASAP IF CHEST PAIN, IF PALPITATIONS THAT ARE NOT GETTING BETTER AFTER A FEW MINUTES OF REST, IF TROUBLE BREATHING THAT DOES NOT GET BETTER WITH A FEW MINUTES OF REST. ON MONDAY, PLEASE CALL YOUR PRIMARY CARE DOCTOR AND YOUR CARDIOLOGIST TO CONFIRM HOSPITAL FOLLOW UP APPOINTMENTS - YOU HAVE A CARDIOLOGY APPOINTMENT 07/22/23 PLEASE BE SURE TO KEEP THIS APPOINTMENT IF IT CANNOT BE MOVED TO AN EARLIER TIME.   Increase activity slowly   Complete by: As directed          Discharge Diagnoses: Principal Problem:   NSTEMI (non-ST elevated myocardial infarction) [facility]) Active Problems:   Sepsis due to pneumonia [facility])   Atrial fibrillation with RVR (HCC)   Acute lower UTI   Hyperthyroidism   Essential  hypertension       HPI: Andrea Griffin is a 89 y.o. Caucasian female with medical history significant for hypertension, dyslipidemia, asthma, GERD, hyperthyroidism and anxiety, who presented to the emergency room 07/14/23 with acute onset of palpitations with a feeling of skipped heart beats as well as mild sharp midsternal chest pain with no radiation.  She has been having dyspnea on exertion lately and dry cough without wheezing.   Of note, was in PCP office earlier 07/14/23 for annual - new dx Afib by EKG w/ rate 119, started on metoprolol succinate 25 mg daily and eliquis 2.5 mg bid, later same day presented to ED as above.   Of note, was hospitalized 05/04/23-05/07/23 for hyponatremia, echo done to evaluate cardiomegaly, 05/12/23 f/u w/ PCP. Hyponatremia has been chronic/recurring, has responded to fluid restriction in the past    Hospital course / significant events:  11/13: to ED, admitted to hospitalist service for sepsis d/t UTI and questionable pneumonia/CAP, NSTEMI, hyponatremia 11/14: cardiology saw pt, adjusting meds and getting echocardiogram, consideration for cath vs noninvasive testing 11/15: Echocardiogram EF 70-75, G2DD, LA moderate dilation. Left heart catheterization - minimal nonobstructive coronary artery disease.  11/16: increasing metoprolol succinate to 100 mg bid, HR still up 90s-100s but pt has no symptoms and is eager for discharge home, feels better today than yesterday.  UCx results for Ecoli, po abx on discharge.   Consultants:  Cardiology   Procedures/Surgeries: none  ASSESSMENT & PLAN:   NSTEMI (non-ST elevated myocardial infarction) ruled out Minimal nonobstructive CAD  Elevated troponin d/t supply-demand mismatch in setting of Afib RVR Heparin infusion, can restart home eliquis if no bleeding from cath site  ASA, statin, beta blocker Increased beta blocker to metoprolol 100 mg bid  Cardiology following - has appt outpatient   Paroxysmal  atrial fibrillation with RVR  continue beta-blocker therapy with Toprol-XL --> increased to metoprolol succinate 50 mg bid --> 100 mg bid  Restart eliquis  Cardiology following - has appt outpatient    Possible sepsis due to UTI (supported by UA, as well as symptoms), ruled out CAP (pneumonia was supported by CT chest findings but she is not having cough/symptoms) Possible SIRS d/t Afib BCx NGx2d  BCx NG. UCx (+)Ecoli --> sensitive to cephalosporin, omnicef on dc    Hyponatremia, mild  Likely hypovolemic  Trend BMP --> improving  Follow outpatient   Hyperthyroidism continue methimazole. check TSH --> WNL, and T4 very slightly above normal Follow outpatient    Essential hypertension Holding home amlodipine and losartan             Discharge Instructions  Allergies as of 07/17/2023       Reactions   Ciprofloxacin Hives   Adhesive [tape]    Pulls off skin   Nitrofurantoin Other (See Comments)   Can not remember reaction   Phenazopyridine Other (See Comments)   Pt doesn't remember   Ranitidine Other (See Comments)   Can not remember reaction   Sulfa Antibiotics Other (See Comments)   Can not remember reaction        Medication List     STOP taking these medications    amLODipine 10 MG tablet Commonly known as: NORVASC   amoxicillin 500 MG capsule Commonly known as: AMOXIL   losartan 100 MG tablet Commonly known as: COZAAR       TAKE these medications    acetaminophen 500 MG tablet Commonly known as: TYLENOL Take 1,000 mg by mouth 2 (two) times daily.   apixaban 5 MG Tabs tablet Commonly known as: ELIQUIS Take 1 tablet (5 mg total) by mouth 2 (two) times daily. What changed:  medication strength how much to take   atorvastatin 40 MG tablet Commonly known as: LIPITOR Take 1 tablet (40 mg total) by mouth daily. Start taking on: July 18, 2023 What changed:  medication strength how much to take   BLUE-EMU PAIN RELIEF DRY EX Apply 1  application topically daily as needed (pain).   Calcium 600+D 600-800 MG-UNIT Tabs Generic drug: Calcium Carb-Cholecalciferol Take 2 tablets by mouth daily.   cefdinir 300 MG capsule Commonly known as: OMNICEF Take 1 capsule (300 mg total) by mouth 2 (two) times daily for 5 days. For UTI   fexofenadine 180 MG tablet Commonly known as: ALLEGRA Take 180 mg by mouth daily.   fluticasone 50 MCG/ACT nasal spray Commonly known as: FLONASE Place 2 sprays into both nostrils at bedtime.   Fluticasone-Salmeterol 100-50 MCG/DOSE Aepb Commonly known as: ADVAIR Inhale 1 puff into the lungs 2 (two) times daily.   methimazole 5 MG tablet Commonly known as: TAPAZOLE Take 2.5 mg by mouth daily.   metoprolol succinate 100 MG 24 hr tablet Commonly known as: TOPROL-XL Take 1 tablet (100 mg total) by mouth 2 (two) times daily. Take with or immediately following a meal. What changed:  medication strength how much to take when to take this additional instructions   omeprazole 20 MG  capsule Commonly known as: PRILOSEC Take 20 mg by mouth 2 (two) times daily.   senna-docusate 8.6-50 MG tablet Commonly known as: Senokot-S Take 2 tablets by mouth daily.         Follow-up Information     Sherie Don, NP Follow up on 07/23/2023.   Specialty: Cardiology Why: 10:00 AM Contact information: [address] [place] [address] [phone]                 Allergies  Allergen Reactions   Ciprofloxacin Hives   Adhesive [Tape]     Pulls off skin   Nitrofurantoin Other (See Comments)    Can not remember reaction   Phenazopyridine Other (See Comments)    Pt doesn't remember   Ranitidine Other (See Comments)    Can not remember reaction   Sulfa Antibiotics Other (See Comments)    Can not remember reaction     Subjective: pt reports feeling great today, walking okay w/ some SOB after walking which resolves w/ rest, no palpitations,no chest pain, no dizziness, no  dysuira    Discharge Exam: BP 132/65   Pulse (!) 101   Temp 98.5 F (36.9 C) (Oral)   Resp 20   Ht 5\' 2"  (1.575 m)   Wt 69.3 kg   SpO2 96%   BMI 27.93 kg/m  General: Pt is alert, awake, not in acute distress Cardiovascular: irreg/irreg, HR 90s-100 by auscultation, no rubs, no gallops Respiratory: CTA bilaterally, no wheezing, no rhonchi Abdominal: Soft, NT, ND, bowel sounds + Extremities: no edema, no cyanosis     The results of significant diagnostics from this hospitalization (including imaging, microbiology, ancillary and laboratory) are listed below for reference.     Microbiology: Recent Results (from the past 240 hour(s))  Urine Culture     Status: Abnormal   Collection Time: 07/14/23  3:19 PM   Specimen: Urine, Random  Result Value Ref Range Status   Specimen Description   Final    URINE, RANDOM Performed at [facility], [address] [place], [place] [address]    Special Requests   Final    NONE Reflexed from [id number] Performed at [facility], [address] [place], [place] [address]    Culture >=100,000 COLONIES/mL ESCHERICHIA COLI (A)  Final   Report Status 07/17/2023 FINAL  Final   Organism ID, Bacteria ESCHERICHIA COLI (A)  Final      Susceptibility   Escherichia coli - MIC*    AMPICILLIN >=32 RESISTANT Resistant     CEFAZOLIN >=64 RESISTANT Resistant     CEFEPIME <=0.12 SENSITIVE Sensitive     CEFTRIAXONE <=0.25 SENSITIVE Sensitive     CIPROFLOXACIN <=0.25 SENSITIVE Sensitive     GENTAMICIN <=1 SENSITIVE Sensitive     IMIPENEM <=0.25 SENSITIVE Sensitive     NITROFURANTOIN 32 SENSITIVE Sensitive     TRIMETH/SULFA <=20 SENSITIVE Sensitive     AMPICILLIN/SULBACTAM >=32 RESISTANT Resistant     PIP/TAZO 32 INTERMEDIATE Intermediate ug/mL    * >=100,000 COLONIES/mL ESCHERICHIA COLI  Blood Culture (routine x 2)     Status: None (Preliminary result)   Collection Time: 07/14/23 10:50 PM   Specimen: BLOOD  Result Value Ref Range  Status   Specimen Description BLOOD BLOOD LEFT HAND  Final   Special Requests   Final    BOTTLES DRAWN AEROBIC AND ANAEROBIC Blood Culture results may not be optimal due to an excessive volume of blood received in culture bottles   Culture  Final    NO GROWTH 3 DAYS Performed at [facility], [address] [place], [place] [address]    Report Status PENDING  Incomplete  Blood Culture (routine x 2)     Status: None (Preliminary result)   Collection Time: 07/14/23 10:50 PM   Specimen: BLOOD  Result Value Ref Range Status   Specimen Description BLOOD BLOOD RIGHT HAND  Final   Special Requests   Final    BOTTLES DRAWN AEROBIC AND ANAEROBIC Blood Culture adequate volume   Culture   Final    NO GROWTH 3 DAYS Performed at [facility], [address] [place], [place] [address]    Report Status PENDING  Incomplete  Resp panel by RT-PCR (RSV, Flu A&B, Covid) Nasal Mucosa     Status: None   Collection Time: 07/15/23 12:08 AM   Specimen: Nasal Mucosa; Nasal Swab  Result Value Ref Range Status   SARS Coronavirus 2 by RT PCR NEGATIVE NEGATIVE Final    Comment: (NOTE) SARS-CoV-2 target nucleic acids are NOT DETECTED.  The SARS-CoV-2 RNA is generally detectable in upper respiratory specimens during the acute phase of infection. The lowest concentration of SARS-CoV-2 viral copies this assay can detect is 138 copies/mL. A negative result does not preclude SARS-Cov-2 infection and should not be used as the sole basis for treatment or other patient management decisions. A negative result may occur with  improper specimen collection/handling, submission of specimen other than nasopharyngeal swab, presence of viral mutation(s) within the areas targeted by this assay, and inadequate number of viral copies(<138 copies/mL). A negative result must be combined with clinical observations, patient history, and epidemiological information. The expected result is Negative.  Fact  Sheet for Patients:  BloggerCourse.com  Fact Sheet for Healthcare Providers:  SeriousBroker.it  This test is no t yet approved or cleared by the [place] FDA and  has been authorized for detection and/or diagnosis of SARS-CoV-2 by FDA under an Emergency Use Authorization (EUA). This EUA will remain  in effect (meaning this test can be used) for the duration of the COVID-19 declaration under Section 564(b)(1) of the Act, 21 U.S.C.section 360bbb-3(b)(1), unless the authorization is terminated  or revoked sooner.       Influenza A by PCR NEGATIVE NEGATIVE Final   Influenza B by PCR NEGATIVE NEGATIVE Final    Comment: (NOTE) The Xpert Xpress SARS-CoV-2/FLU/RSV plus assay is intended as an aid in the diagnosis of influenza from Nasopharyngeal swab specimens and should not be used as a sole basis for treatment. Nasal washings and aspirates are unacceptable for Xpert Xpress SARS-CoV-2/FLU/RSV testing.  Fact Sheet for Patients: BloggerCourse.com  Fact Sheet for Healthcare Providers: SeriousBroker.it  This test is not yet approved or cleared by the [place] FDA and has been authorized for detection and/or diagnosis of SARS-CoV-2 by FDA under an Emergency Use Authorization (EUA). This EUA will remain in effect (meaning this test can be used) for the duration of the COVID-19 declaration under Section 564(b)(1) of the Act, 21 U.S.C. section 360bbb-3(b)(1), unless the authorization is terminated or revoked.     Resp Syncytial Virus by PCR NEGATIVE NEGATIVE Final    Comment: (NOTE) Fact Sheet for Patients: BloggerCourse.com  Fact Sheet for Healthcare Providers: SeriousBroker.it  This test is not yet approved or cleared by the [place] FDA and has been authorized for detection and/or diagnosis of SARS-CoV-2 by FDA under an  Emergency Use Authorization (EUA). This EUA will remain in effect (meaning this test can  be used) for the duration of the COVID-19 declaration under Section 564(b)(1) of the Act, 21 U.S.C. section 360bbb-3(b)(1), unless the authorization is terminated or revoked.  Performed at [facility], [address] [place], [place] [address]   MRSA Next Gen by PCR, Nasal     Status: None   Collection Time: 07/15/23 12:08 AM   Specimen: Nasal Mucosa; Nasal Swab  Result Value Ref Range Status   MRSA by PCR Next Gen NOT DETECTED NOT DETECTED Final    Comment: (NOTE) The GeneXpert MRSA Assay (FDA approved for NASAL specimens only), is one component of a comprehensive MRSA colonization surveillance program. It is not intended to diagnose MRSA infection nor to guide or monitor treatment for MRSA infections. Test performance is not FDA approved in patients less than 82 years old. Performed at [facility], [address] [place], [place] [address]      Labs: BNP (last 3 results) No results for input(s): "BNP" in the last 8760 hours. Basic Metabolic Panel: Recent Labs  Lab 07/14/23 1156 07/14/23 1427 07/15/23 0608 07/16/23 0538 07/17/23 0532  NA  --  127* 129* 130* 130*  K  --  4.0 3.3* 3.8 3.6  CL  --  94* 95* 98 98  CO2  --  22 22 21* 21*  GLUCOSE  --  130* 91 90 87  BUN  --  14 9 8 8   CREATININE  --  0.55 <0.30* 0.42* 0.41*  CALCIUM  --  8.2* 8.2* 8.3* 8.2*  MG 1.8  --   --   --   --    Liver Function Tests: Recent Labs  Lab 07/14/23 1156  AST 39  ALT 34  ALKPHOS 110  BILITOT 0.7  PROT 6.7  ALBUMIN 3.2*   No results for input(s): "LIPASE", "AMYLASE" in the last 168 hours. No results for input(s): "AMMONIA" in the last 168 hours. CBC: Recent Labs  Lab 07/14/23 1427 07/15/23 0608 07/16/23 0538 07/17/23 0532  WBC 8.3 7.8 8.2 7.0  HGB 11.9* 11.0* 11.5* 11.4*  HCT 35.3* 31.6* 33.9* 33.6*  MCV 97.5 92.9 95.2 95.5  PLT 300 277 305 332   Cardiac  Enzymes: No results for input(s): "CKTOTAL", "CKMB", "CKMBINDEX", "TROPONINI" in the last 168 hours. BNP: Invalid input(s): "POCBNP" CBG: No results for input(s): "GLUCAP" in the last 168 hours. D-Dimer No results for input(s): "DDIMER" in the last 72 hours. Hgb A1c No results for input(s): "HGBA1C" in the last 72 hours. Lipid Profile No results for input(s): "CHOL", "HDL", "LDLCALC", "TRIG", "CHOLHDL", "LDLDIRECT" in the last 72 hours. Thyroid function studies No results for input(s): "TSH", "T4TOTAL", "T3FREE", "THYROIDAB" in the last 72 hours.  Invalid input(s): "FREET3" Anemia work up No results for input(s): "VITAMINB12", "FOLATE", "FERRITIN", "TIBC", "IRON", "RETICCTPCT" in the last 72 hours. Urinalysis    Component Value Date/Time   COLORURINE AMBER (A) 07/14/2023 1519   APPEARANCEUR TURBID (A) 07/14/2023 1519   APPEARANCEUR Clear 03/13/2014 1403   LABSPEC 1.018 07/14/2023 1519   LABSPEC 1.030 03/13/2014 1403   PHURINE 5.0 07/14/2023 1519   GLUCOSEU NEGATIVE 07/14/2023 1519   GLUCOSEU Negative 03/13/2014 1403   HGBUR NEGATIVE 07/14/2023 1519   BILIRUBINUR NEGATIVE 07/14/2023 1519   BILIRUBINUR Negative 03/13/2014 1403   KETONESUR NEGATIVE 07/14/2023 1519   PROTEINUR 100 (A) 07/14/2023 1519   NITRITE NEGATIVE 07/14/2023 1519   LEUKOCYTESUR LARGE (A) 07/14/2023 1519   LEUKOCYTESUR Negative 03/13/2014 1403   Sepsis Labs Recent Labs  Lab 07/14/23 1427 07/15/23  2956 07/16/23 0538 07/17/23 0532  WBC 8.3 7.8 8.2 7.0   Microbiology Recent Results (from the past 240 hour(s))  Urine Culture     Status: Abnormal   Collection Time: 07/14/23  3:19 PM   Specimen: Urine, Random  Result Value Ref Range Status   Specimen Description   Final    URINE, RANDOM Performed at [facility], [address] [place], [place] [address]    Special Requests   Final    NONE Reflexed from [id number] Performed at [facility], [address] [place], [place]  [address]    Culture >=100,000 COLONIES/mL ESCHERICHIA COLI (A)  Final   Report Status 07/17/2023 FINAL  Final   Organism ID, Bacteria ESCHERICHIA COLI (A)  Final      Susceptibility   Escherichia coli - MIC*    AMPICILLIN >=32 RESISTANT Resistant     CEFAZOLIN >=64 RESISTANT Resistant     CEFEPIME <=0.12 SENSITIVE Sensitive     CEFTRIAXONE <=0.25 SENSITIVE Sensitive     CIPROFLOXACIN <=0.25 SENSITIVE Sensitive     GENTAMICIN <=1 SENSITIVE Sensitive     IMIPENEM <=0.25 SENSITIVE Sensitive     NITROFURANTOIN 32 SENSITIVE Sensitive     TRIMETH/SULFA <=20 SENSITIVE Sensitive     AMPICILLIN/SULBACTAM >=32 RESISTANT Resistant     PIP/TAZO 32 INTERMEDIATE Intermediate ug/mL    * >=100,000 COLONIES/mL ESCHERICHIA COLI  Blood Culture (routine x 2)     Status: None (Preliminary result)   Collection Time: 07/14/23 10:50 PM   Specimen: BLOOD  Result Value Ref Range Status   Specimen Description BLOOD BLOOD LEFT HAND  Final   Special Requests   Final    BOTTLES DRAWN AEROBIC AND ANAEROBIC Blood Culture results may not be optimal due to an excessive volume of blood received in culture bottles   Culture   Final    NO GROWTH 3 DAYS Performed at [facility], [address] [place], [place] [address]    Report Status PENDING  Incomplete  Blood Culture (routine x 2)     Status: None (Preliminary result)   Collection Time: 07/14/23 10:50 PM   Specimen: BLOOD  Result Value Ref Range Status   Specimen Description BLOOD BLOOD RIGHT HAND  Final   Special Requests   Final    BOTTLES DRAWN AEROBIC AND ANAEROBIC Blood Culture adequate volume   Culture   Final    NO GROWTH 3 DAYS Performed at [facility], [address] [place], [place] [address]    Report Status PENDING  Incomplete  Resp panel by RT-PCR (RSV, Flu A&B, Covid) Nasal Mucosa     Status: None   Collection Time: 07/15/23 12:08 AM   Specimen: Nasal Mucosa; Nasal Swab  Result Value Ref Range Status   SARS Coronavirus  2 by RT PCR NEGATIVE NEGATIVE Final    Comment: (NOTE) SARS-CoV-2 target nucleic acids are NOT DETECTED.  The SARS-CoV-2 RNA is generally detectable in upper respiratory specimens during the acute phase of infection. The lowest concentration of SARS-CoV-2 viral copies this assay can detect is 138 copies/mL. A negative result does not preclude SARS-Cov-2 infection and should not be used as the sole basis for treatment or other patient management decisions. A negative result may occur with  improper specimen collection/handling, submission of specimen other than nasopharyngeal swab, presence of viral mutation(s) within the areas targeted by this assay, and inadequate number of viral copies(<138 copies/mL). A negative result must be combined with clinical observations, patient history,  and epidemiological information. The expected result is Negative.  Fact Sheet for Patients:  BloggerCourse.com  Fact Sheet for Healthcare Providers:  SeriousBroker.it  This test is no t yet approved or cleared by the [place] FDA and  has been authorized for detection and/or diagnosis of SARS-CoV-2 by FDA under an Emergency Use Authorization (EUA). This EUA will remain  in effect (meaning this test can be used) for the duration of the COVID-19 declaration under Section 564(b)(1) of the Act, 21 U.S.C.section 360bbb-3(b)(1), unless the authorization is terminated  or revoked sooner.       Influenza A by PCR NEGATIVE NEGATIVE Final   Influenza B by PCR NEGATIVE NEGATIVE Final    Comment: (NOTE) The Xpert Xpress SARS-CoV-2/FLU/RSV plus assay is intended as an aid in the diagnosis of influenza from Nasopharyngeal swab specimens and should not be used as a sole basis for treatment. Nasal washings and aspirates are unacceptable for Xpert Xpress SARS-CoV-2/FLU/RSV testing.  Fact Sheet for Patients: BloggerCourse.com  Fact  Sheet for Healthcare Providers: SeriousBroker.it  This test is not yet approved or cleared by the [place] FDA and has been authorized for detection and/or diagnosis of SARS-CoV-2 by FDA under an Emergency Use Authorization (EUA). This EUA will remain in effect (meaning this test can be used) for the duration of the COVID-19 declaration under Section 564(b)(1) of the Act, 21 U.S.C. section 360bbb-3(b)(1), unless the authorization is terminated or revoked.     Resp Syncytial Virus by PCR NEGATIVE NEGATIVE Final    Comment: (NOTE) Fact Sheet for Patients: BloggerCourse.com  Fact Sheet for Healthcare Providers: SeriousBroker.it  This test is not yet approved or cleared by the [place] FDA and has been authorized for detection and/or diagnosis of SARS-CoV-2 by FDA under an Emergency Use Authorization (EUA). This EUA will remain in effect (meaning this test can be used) for the duration of the COVID-19 declaration under Section 564(b)(1) of the Act, 21 U.S.C. section 360bbb-3(b)(1), unless the authorization is terminated or revoked.  Performed at [facility], [address] [place], [place] [address]   MRSA Next Gen by PCR, Nasal     Status: None   Collection Time: 07/15/23 12:08 AM   Specimen: Nasal Mucosa; Nasal Swab  Result Value Ref Range Status   MRSA by PCR Next Gen NOT DETECTED NOT DETECTED Final    Comment: (NOTE) The GeneXpert MRSA Assay (FDA approved for NASAL specimens only), is one component of a comprehensive MRSA colonization surveillance program. It is not intended to diagnose MRSA infection nor to guide or monitor treatment for MRSA infections. Test performance is not FDA approved in patients less than 43 years old. Performed at [facility], [address] [place], [place] [address]    Imaging ECHOCARDIOGRAM LIMITED  Result Date: 07/17/2023     ECHOCARDIOGRAM LIMITED REPORT   Patient Name:   TEQUIA PELHAM Date of Exam: 07/16/2023 Medical Rec #:  [id number]       Height:       62.0 in Accession #:    [id number]      Weight:       152.7 lb Date of Birth:  10/29/1931       BSA:          1.705 m Patient Age:    89 years        BP:           136/71 mmHg Patient Gender: F  HR:           83 bpm. Exam Location:  [facility] Procedure: Limited Echo, Cardiac Doppler and Color Doppler Indications:     Chest Pain R07.9  History:         Patient has prior history of Echocardiogram examinations, most                  recent 05/06/2023. Previous Myocardial Infarction,                  Arrythmias:Atrial Fibrillation; Risk Factors:Hypertension.  Sonographer:     Lucendia Herrlich RCS Referring Phys:  [id number] A ARIDA Diagnosing Phys: Julien Nordmann MD IMPRESSIONS  1. Left ventricular ejection fraction, by estimation, is 55 to 60%. The left ventricle has normal function. The left ventricle has no regional wall motion abnormalities. There is mild left ventricular hypertrophy. Left ventricular diastolic parameters are indeterminate.  2. Right ventricular systolic function is mildly reduced. The right ventricular size is normal. There is normal pulmonary artery systolic pressure. The estimated right ventricular systolic pressure is 33.9 mmHg.  3. Left atrial size was moderately dilated.  4. Right atrial size was moderately dilated.  5. The mitral valve is normal in structure. Moderate mitral valve regurgitation. No evidence of mitral stenosis.  6. Tricuspid valve regurgitation is mild to moderate.  7. The aortic valve has an indeterminant number of cusps. There is mild calcification of the aortic valve. Aortic valve regurgitation is mild. Aortic valve sclerosis is present, with no evidence of aortic valve stenosis.  8. The inferior vena cava is normal in size with greater than 50% respiratory variability, suggesting right atrial pressure of 3 mmHg. FINDINGS  Left  Ventricle: Left ventricular ejection fraction, by estimation, is 55 to 60%. The left ventricle has normal function. The left ventricle has no regional wall motion abnormalities. The left ventricular internal cavity size was normal in size. There is  mild left ventricular hypertrophy. Left ventricular diastolic parameters are indeterminate. Right Ventricle: The right ventricular size is normal. No increase in right ventricular wall thickness. Right ventricular systolic function is mildly reduced. There is normal pulmonary artery systolic pressure. The tricuspid regurgitant velocity is 2.69 m/s, and with an assumed right atrial pressure of 5 mmHg, the estimated right ventricular systolic pressure is 33.9 mmHg. Left Atrium: Left atrial size was moderately dilated. Right Atrium: Right atrial size was moderately dilated. Pericardium: There is no evidence of pericardial effusion. Mitral Valve: The mitral valve is normal in structure. Moderate mitral valve regurgitation. No evidence of mitral valve stenosis. Tricuspid Valve: The tricuspid valve is normal in structure. Tricuspid valve regurgitation is mild to moderate. No evidence of tricuspid stenosis. Aortic Valve: The aortic valve has an indeterminant number of cusps. There is mild calcification of the aortic valve. Aortic valve regurgitation is mild. Aortic valve sclerosis is present, with no evidence of aortic valve stenosis. Aortic valve mean gradient measures 7.5 mmHg. Aortic valve peak gradient measures 13.2 mmHg. Aortic valve area, by VTI measures 2.58 cm. Pulmonic Valve: The pulmonic valve was normal in structure. Pulmonic valve regurgitation is not visualized. No evidence of pulmonic stenosis. Aorta: The aortic root is normal in size and structure. Venous: The inferior vena cava is normal in size with greater than 50% respiratory variability, suggesting right atrial pressure of 3 mmHg. IAS/Shunts: No atrial level shunt detected by color flow Doppler. LEFT  VENTRICLE PLAX 2D LVIDd:         4.40 cm   Diastology LVIDs:  2.70 cm   LV e' medial:    12.30 cm/s LV PW:         1.00 cm   LV E/e' medial:  10.2 LV IVS:        1.10 cm   LV e' lateral:   13.40 cm/s LVOT diam:     2.20 cm   LV E/e' lateral: 9.4 LV SV:         79 LV SV Index:   46 LVOT Area:     3.80 cm  RIGHT VENTRICLE             IVC RV S prime:     17.70 cm/s  IVC diam: 1.90 cm TAPSE (M-mode): 1.3 cm LEFT ATRIUM         Index LA diam:    5.30 cm 3.11 cm/m  AORTIC VALVE AV Area (Vmax):    2.61 cm AV Area (Vmean):   2.52 cm AV Area (VTI):     2.58 cm AV Vmax:           182.00 cm/s AV Vmean:          128.000 cm/s AV VTI:            0.306 m AV Peak Grad:      13.2 mmHg AV Mean Grad:      7.5 mmHg LVOT Vmax:         125.00 cm/s LVOT Vmean:        84.900 cm/s LVOT VTI:          0.208 m LVOT/AV VTI ratio: 0.68  AORTA Ao Root diam: 2.90 cm Ao Asc diam:  2.90 cm MITRAL VALVE                TRICUSPID VALVE MV Area (PHT): 4.96 cm     TR Peak grad:   28.9 mmHg MV Decel Time: 153 msec     TR Vmax:        269.00 cm/s MV E velocity: 126.00 cm/s                             SHUNTS                             Systemic VTI:  0.21 m                             Systemic Diam: 2.20 cm Julien Nordmann MD Electronically signed by Julien Nordmann MD Signature Date/Time: 07/17/2023/3:55:35 PM    Final    CARDIAC CATHETERIZATION  Result Date: 07/16/2023 1.  Minor irregularities with no evidence of obstructive coronary artery disease. 2.  Left ventricular angiography was not performed.  LVEDP was normal. Recommendations: Elevated troponin is likely due to supply demand mismatch in the setting of atrial fibrillation with RVR. Continue rate control. Resume heparin 2 hours after removal of TR band.  The patient can be transition to Eliquis tomorrow morning if no bleeding issues.      Time coordinating discharge: over 30 minutes  SIGNED:  Sunnie Nielsen DO [facility]

## 2023-07-19 ENCOUNTER — Encounter: Payer: Self-pay | Admitting: Cardiovascular Disease

## 2023-07-19 LAB — CULTURE, BLOOD (ROUTINE X 2)
Culture: NO GROWTH
Culture: NO GROWTH
Special Requests: ADEQUATE

## 2023-07-22 NOTE — Progress Notes (Deleted)
Cardiology Office Note Date:  07/22/2023  Patient ID:  Andrea Griffin, Andrea Griffin [birth year], MRN [id number] PCP:  Marisue Ivan, MD  Cardiologist:  Julien Nordmann, MD Electrophysiologist: None  ***refresh   Chief Complaint: ***  History of Present Illness: Andrea Griffin is a 89 y.o. female with PMH notable for paroxysmal A-fib, non-obs CAD, mod MR, hyperthyroid, HLD, HTN; seen today for None for post hospital follow up.   She was admitted from September due to weakness CT chest at that time identified enlarged thyroid gland with chronic peripheral interstitial lung disease, coronary calcifications, and cardiomegaly.  She presented to [facility] 11/13 with resting and exertional substernal chest heaviness and tightness with dyspnea at rest and profound DOE.  She been seen earlier in the day at her PCP and was noted to be in A-fib. She underwent LHC which showed non-obs CAD.  Eliquis started. Toprol titrated and she was discharged 11/16.  She saw PCP earlier this week and was doing well.   On follow-up today,  *** AF burden, symptoms *** palpitations *** bleeding concerns  - potassium supplement    Since discharge from hospital the patient reports doing ***.  she denies chest pain, palpitations, dyspnea, PND, orthopnea, nausea, vomiting, dizziness, syncope, edema, weight gain, or early satiety.       AAD History: None     ROS:  Please see the history of present illness. All other systems are reviewed and otherwise negative.   PHYSICAL EXAM: *** VS:  There were no vitals taken for this visit. BMI: There is no height or weight on file to calculate BMI.  GEN- The patient is well appearing, alert and oriented x 3 today.   Lungs- Clear to ausculation bilaterally, normal work of breathing.  Heart- {Blank single:19197::"Regular","Irregularly irregular"} rate and rhythm, no murmurs, rubs or gallops Extremities- {EDEMA LEVEL:28147::"No"} peripheral edema, warm, dry   EKG is  ordered. Personal review of EKG from today shows:  ***          Additional studies reviewed include: Previous EP, cardiology notes.   TTE, 07/16/2023 1. Left ventricular ejection fraction, by estimation, is 55 to 60%. The left ventricle has normal function. The left ventricle has no regional wall motion abnormalities. There is mild left ventricular hypertrophy. Left ventricular diastolic parameters are indeterminate.   2. Right ventricular systolic function is mildly reduced. The right ventricular size is normal. There is normal pulmonary artery systolic pressure. The estimated right ventricular systolic pressure is 33.9 mmHg.   3. Left atrial size was moderately dilated.   4. Right atrial size was moderately dilated.   5. The mitral valve is normal in structure. Moderate mitral valve regurgitation. No evidence of mitral stenosis.   6. Tricuspid valve regurgitation is mild to moderate.   7. The aortic valve has an indeterminant number of cusps. There is mild calcification of the aortic valve. Aortic valve regurgitation is mild. Aortic valve sclerosis is present, with no evidence of aortic valve stenosis.   8. The inferior vena cava is normal in size with greater than 50% respiratory variability, suggesting right atrial pressure of 3 mmHg.      LHC, 07/16/2023 1.  Minor irregularities with no evidence of obstructive coronary artery disease. 2.  Left ventricular angiography was not performed.  LVEDP was normal.   Recommendations: Elevated troponin is likely due to supply demand mismatch in the setting of atrial fibrillation with RVR. Continue rate control. Resume heparin 2 hours after removal of TR band.  The  patient can be transition to Eliquis tomorrow morning if no bleeding issues.  CTA PE, 07/14/2023 1. No pulmonary embolus. 2. Mild pulmonary fibrosis/interstitial lung disease with developing infection/inflammation of the left upper lobe-recommend follow-up CT in 3 months to evaluate  for resolution. 3. Cardiomegaly. 4. Aortic Atherosclerosis (ICD10-I70.0) including left anterior descending and aortic valve leaflet calcifications-correlate for aortic stenosis.  TTE, 05/05/2023  1. Left ventricular ejection fraction, by estimation, is 70 to 75%. The left ventricle has hyperdynamic function. The left ventricle has no regional wall motion abnormalities. Left ventricular diastolic parameters are consistent with Grade II diastolic dysfunction (pseudonormalization).   2. Right ventricular systolic function is normal. The right ventricular size is mildly enlarged.   3. Left atrial size was moderately dilated.   4. Right atrial size was mildly dilated.   5. The mitral valve is myxomatous. Mild mitral valve regurgitation.   6. The aortic valve is normal in structure. Aortic valve regurgitation is moderate.    ASSESSMENT AND PLAN:  #) persis afib #) hypokalemia Patient recently presented to [facility] with anxiety-sensation and palpitations times many weeks, found to be in A-fib with RVR. Continue 100 mg Toprol twice daily    #) Hypercoag d/t persis afib CHA2DS2-VASc Score = 4 [CHF History: 0, HTN History: 1, Diabetes History: 0, Stroke History: 0, Vascular Disease History: 0, Age Score: 2, Gender Score: 1].  Therefore, the patient's annual risk of stroke is 4.8 %.    Stroke ppx - 5mg  eliquis, does not meet dose reduction criteria d/t weight > 60kg and Cr < 1.5 No bleeding concerns  #) hyperthyroid TSH normal, T4 slightly elevated at 1.16 Continue methimazole 2.5 mg daily Continue Toprol as above Continue follow-up with PCP for further management    #) HTN *** goal today.  Recommend checking blood pressures 1-2 times per week at home and recording the values.  Recommend bringing these recordings to the primary care physician.      {Are you ordering a CV Procedure (e.g. stress test, cath, DCCV, TEE, etc)?   Press F2        :[id number]}   Current medicines are reviewed at  length with the patient today.   The patient {ACTIONS; HAS/DOES NOT HAVE:19233} concerns regarding her medicines.  The following changes were made today:  {NONE DEFAULTED:18576}  Labs/ tests ordered today include: *** No orders of the defined types were placed in this encounter.    Disposition: Follow up with {EPMDS:28135} or EP APP {EPFOLLOW UP:28173}   Signed, Sherie Don, NP  07/22/23  8:15 AM  Electrophysiology [facility]

## 2023-07-23 ENCOUNTER — Ambulatory Visit: Payer: Medicare PPO | Attending: Cardiology | Admitting: Cardiology

## 2023-07-23 DIAGNOSIS — D6869 Other thrombophilia: Secondary | ICD-10-CM

## 2023-07-23 DIAGNOSIS — E876 Hypokalemia: Secondary | ICD-10-CM

## 2023-07-23 DIAGNOSIS — E0590 Thyrotoxicosis, unspecified without thyrotoxic crisis or storm: Secondary | ICD-10-CM

## 2023-07-23 DIAGNOSIS — I4891 Unspecified atrial fibrillation: Secondary | ICD-10-CM

## 2023-07-23 DIAGNOSIS — I10 Essential (primary) hypertension: Secondary | ICD-10-CM

## 2023-08-08 MED ORDER — SODIUM CHLORIDE 0.9 % IV SOLN: INTRAVENOUS | Status: DC

## 2023-08-09 ENCOUNTER — Encounter: Payer: Self-pay | Admitting: Cardiology

## 2023-08-09 ENCOUNTER — Ambulatory Visit: Payer: Medicare PPO | Admitting: Anesthesiology

## 2023-08-09 ENCOUNTER — Ambulatory Visit
Admission: RE | Admit: 2023-08-09 | Discharge: 2023-08-09 | Disposition: A | Discharge status: Home / Self Care | Payer: Medicare PPO | Attending: Cardiology | Admitting: Cardiology

## 2023-08-09 ENCOUNTER — Other Ambulatory Visit: Payer: Self-pay

## 2023-08-09 ENCOUNTER — Ambulatory Visit
Admission: RE | Admit: 2023-08-09 | Discharge: 2023-08-09 | Disposition: A | Discharge status: Home / Self Care | Payer: Medicare PPO | Source: Home / Self Care | Attending: Student | Admitting: Cardiology

## 2023-08-09 ENCOUNTER — Encounter
Admission: RE | Disposition: A | Discharge status: Home / Self Care | Payer: Self-pay | Source: Home / Self Care | Attending: Cardiology

## 2023-08-09 DIAGNOSIS — Z7901 Long term (current) use of anticoagulants: Secondary | ICD-10-CM | POA: Insufficient documentation

## 2023-08-09 DIAGNOSIS — Z79899 Other long term (current) drug therapy: Secondary | ICD-10-CM | POA: Diagnosis not present

## 2023-08-09 DIAGNOSIS — I4819 Other persistent atrial fibrillation: Secondary | ICD-10-CM | POA: Diagnosis present

## 2023-08-09 DIAGNOSIS — E785 Hyperlipidemia, unspecified: Secondary | ICD-10-CM | POA: Insufficient documentation

## 2023-08-09 DIAGNOSIS — I4891 Unspecified atrial fibrillation: Secondary | ICD-10-CM

## 2023-08-09 DIAGNOSIS — I10 Essential (primary) hypertension: Secondary | ICD-10-CM | POA: Insufficient documentation

## 2023-08-09 DIAGNOSIS — K219 Gastro-esophageal reflux disease without esophagitis: Secondary | ICD-10-CM | POA: Insufficient documentation

## 2023-08-09 DIAGNOSIS — Z79890 Hormone replacement therapy (postmenopausal): Secondary | ICD-10-CM | POA: Diagnosis not present

## 2023-08-09 DIAGNOSIS — E0590 Thyrotoxicosis, unspecified without thyrotoxic crisis or storm: Secondary | ICD-10-CM | POA: Insufficient documentation

## 2023-08-09 DIAGNOSIS — R0609 Other forms of dyspnea: Secondary | ICD-10-CM | POA: Diagnosis not present

## 2023-08-09 DIAGNOSIS — I080 Rheumatic disorders of both mitral and aortic valves: Secondary | ICD-10-CM | POA: Diagnosis not present

## 2023-08-09 HISTORY — PX: CARDIOVERSION: SHX1299

## 2023-08-09 HISTORY — PX: TEE WITHOUT CARDIOVERSION: SHX5443

## 2023-08-09 SURGERY — ECHOCARDIOGRAM, TRANSESOPHAGEAL
Anesthesia: General

## 2023-08-09 MED ORDER — PROPOFOL 500 MG/50ML IV EMUL
INTRAVENOUS | Status: DC | PRN
  Administered 2023-08-09: 75 ug/kg/min via INTRAVENOUS

## 2023-08-09 MED ORDER — LIDOCAINE HCL (CARDIAC) PF 100 MG/5ML IV SOSY
PREFILLED_SYRINGE | INTRAVENOUS | Status: DC | PRN
  Administered 2023-08-09: 40 mg via INTRAVENOUS

## 2023-08-09 MED ORDER — PROPOFOL 10 MG/ML IV BOLUS
INTRAVENOUS | Status: DC | PRN
  Administered 2023-08-09: 10 mg via INTRAVENOUS
  Administered 2023-08-09: 40 mg via INTRAVENOUS

## 2023-08-09 NOTE — Progress Notes (Signed)
*  PRELIMINARY RESULTS* Echocardiogram Echocardiogram Transesophageal has been performed.  Andrea Griffin 08/09/2023, 12:56 PM

## 2023-08-09 NOTE — Anesthesia Procedure Notes (Signed)
Procedure Name: General with mask airway Date/Time: 08/09/2023 12:39 PM  Performed by: Mohammed Kindle, CRNAPre-anesthesia Checklist: Patient identified, Emergency Drugs available, Suction available and Patient being monitored Patient Re-evaluated:Patient Re-evaluated prior to induction Oxygen Delivery Method: Simple face mask Induction Type: IV induction Placement Confirmation: positive ETCO2, CO2 detector and breath sounds checked- equal and bilateral Dental Injury: Teeth and Oropharynx as per pre-operative assessment

## 2023-08-09 NOTE — Transfer of Care (Signed)
Immediate Anesthesia Transfer of Care Note  Patient: Andrea Griffin  Procedure(s) Performed: TRANSESOPHAGEAL ECHOCARDIOGRAM (TEE) CARDIOVERSION  Patient Location:  [facility]   Anesthesia Type:General  Level of Consciousness: awake, drowsy, and patient cooperative  Airway & Oxygen Therapy: Patient Spontanous Breathing and Patient connected to face mask oxygen  Post-op Assessment: Report given to RN and Post -op Vital signs reviewed and stable  Post vital signs: Reviewed and stable  Last Vitals:  Vitals Value Taken Time  BP 113/91 (98) 1255  Temp    Pulse 98   Resp 16   SpO2 95     Last Pain:  Vitals:   08/09/23 1137  TempSrc: Oral  PainSc: 6          Complications: No notable events documented.

## 2023-08-09 NOTE — Anesthesia Preprocedure Evaluation (Signed)
Anesthesia Evaluation  Patient identified by MRN, date of birth, ID band Patient awake    Reviewed: Allergy & Precautions, NPO status , Patient's Chart, lab work & pertinent test results  History of Anesthesia Complications Negative for: history of anesthetic complications  Airway Mallampati: III  TM Distance: <3 FB Neck ROM: full    Dental  (+) Chipped   Pulmonary asthma    Pulmonary exam normal        Cardiovascular hypertension, + Past MI  + dysrhythmias Atrial Fibrillation  Rhythm:irregular Rate:Normal     Neuro/Psych negative neurological ROS  negative psych ROS   GI/Hepatic Neg liver ROS,GERD  Controlled,,  Endo/Other   Hyperthyroidism   Renal/GU negative Renal ROS  negative genitourinary   Musculoskeletal   Abdominal   Peds  Hematology negative hematology ROS (+)   Anesthesia Other Findings Hoarse voice at baseline.  Past Medical History: No date: Anxiety No date: Arthritis No date: Asthma No date: Cancer [facility])     Comment:  melanoma skin cancer on head No date: Diastolic dysfunction     Comment:  a. 05/2023 Echo: EF 70-76^, no rwma, GrII DD, nl RV fxn,               mildly dil RA, mod dil LA. Mild MR. Mod AI. No date: GERD (gastroesophageal reflux disease) No date: History of recurrent cystitis No date: Hyperlipidemia No date: Hypertension No date: Infection of prosthetic hip joint (HCC) No date: Mitral regurgitation No date: Moderate aortic insufficiency No date: Nocturia No date: Obesity No date: Osteopenia No date: Persistent atrial fibrillation (HCC) No date: Thyroid disease No date: Urinary frequency No date: Urinary incontinence No date: Urinary urgency No date: Wears glasses  Past Surgical History: No date: ABDOMINAL HYSTERECTOMY 01/21/2016: BACK SURGERY     Comment:  lumbar fusion No date: bladder stem dilation No date: BREAST BIOPSY; Right     Comment:  x2 No date: BREAST  BIOPSY; Right     Comment:  x2 No date: CHOLECYSTECTOMY 03/27/2014: conversion of hemiarthroplasty to total hip arthroplasty;  Left 2010: HEMIARTHROPLASTY HIP; Left 2013: HEMIARTHROPLASTY HIP; Right     Comment:  Dr. Rosita Kea 09/15/2012: INCISION AND DRAINAGE HIP; Right 2013: INCISION AND DRAINAGE HIP; Right     Comment:  Dr. Rosita Kea 07/16/2023: LEFT HEART CATH AND CORONARY ANGIOGRAPHY; N/A     Comment:  Procedure: LEFT HEART CATH AND CORONARY ANGIOGRAPHY;                Surgeon: Iran Ouch, MD;  Location: [facility] INVASIVE               CV LAB;  Service: Cardiovascular;  Laterality: N/A; 01/21/2016: LUMBAR FUSION     Comment:  L4-L5 by Dr. Danielle Dess No date: MELANOMA EXCISION     Comment:  skin cancer on top of head 10/05/2019: OPEN REDUCTION INTERNAL FIXATION (ORIF) DISTAL RADIAL  FRACTURE; Right     Comment:  Procedure: OPEN REDUCTION INTERNAL FIXATION (ORIF)               DISTAL RADIAL FRACTURE;  Surgeon: Kennedy Bucker, MD;                Location: [facility] ORS;  Service: Orthopedics;  Laterality:               Right;  BMI    Body Mass Index: 27.80 kg/m      Reproductive/Obstetrics negative OB ROS  Anesthesia Physical Anesthesia Plan  ASA: 3  Anesthesia Plan: General   Post-op Pain Management:    Induction: Intravenous  PONV Risk Score and Plan: Propofol infusion and TIVA  Airway Management Planned: Natural Airway and Nasal Cannula  Additional Equipment:   Intra-op Plan:   Post-operative Plan:   Informed Consent: I have reviewed the patients History and Physical, chart, labs and discussed the procedure including the risks, benefits and alternatives for the proposed anesthesia with the patient or authorized representative who has indicated his/her understanding and acceptance.     Dental Advisory Given  Plan Discussed with: Anesthesiologist, CRNA and Surgeon  Anesthesia Plan Comments: (Patient consented for risks of  anesthesia including but not limited to:  - adverse reactions to medications - risk of airway placement if required - damage to eyes, teeth, lips or other oral mucosa - nerve damage due to positioning  - sore throat or hoarseness - Damage to heart, brain, nerves, lungs, other parts of body or loss of life  Patient voiced understanding and assent.)       Anesthesia Quick Evaluation

## 2023-08-09 NOTE — Anesthesia Postprocedure Evaluation (Signed)
Anesthesia Post Note  Patient: Andrea Griffin  Procedure(s) Performed: TRANSESOPHAGEAL ECHOCARDIOGRAM (TEE) CARDIOVERSION  Patient location during evaluation: [facility] Anesthesia Type: General Level of consciousness: awake and alert Pain management: pain level controlled Vital Signs Assessment: post-procedure vital signs reviewed and stable Respiratory status: spontaneous breathing, nonlabored ventilation, respiratory function stable and patient connected to nasal cannula oxygen Cardiovascular status: blood pressure returned to baseline and stable Postop Assessment: no apparent nausea or vomiting Anesthetic complications: no   No notable events documented.   Last Vitals:  Vitals:   08/09/23 1315 08/09/23 1330  BP: (!) 159/73 (!) 136/117  Pulse: 98 94  Resp: 20 (!) 23  Temp:    SpO2: 93% 93%    Last Pain:  Vitals:   08/09/23 1330  TempSrc:   PainSc: 0-No pain                 Cleda Mccreedy Gerold Sar

## 2023-08-10 ENCOUNTER — Encounter: Payer: Self-pay | Admitting: Cardiology

## 2023-08-10 LAB — ECHO TEE

## 2023-08-16 NOTE — H&P (Signed)
History and physical  Chief Complaint: Persistent atrial fibrillation Date of Service: 08/03/2023 Date of Birth: [birth year] PCP: Andrea Ivan, MD  History of Present Illness: Andrea Griffin is a 89 y.o.female patient who presented for follow-up of persistent atrial fibrillation.  Past medical history significant for recently diagnosed atrial fibrillation, hypertension, hyperlipidemia, hyperthyroidism. She was recently admitted to hospital 07/2023 with symptoms of shortness of breath, palpitations. Noted to be in A-fib with RVR, elevated troponin, underwent left heart catheterization which showed no significant CAD, normal LVEDP. Echocardiogram with normal LVEF of 55 to 60%, mild RV dysfunction, moderate biatrial enlargement, moderate mitral regurgitation, mild to moderate TR. Timing of A-fib onset was unclear, managed with rate control strategy.  Today patient detailed that she is having worsening exertional dyspnea, along with palpitations. No chest pain/pressure. No orthopnea or pedal edema. At baseline she does usual household activity but not very active. EKG today showed atrial fibrillation with heart rate of 100 bpm.  Past Medical and Surgical History  Past Medical History Past Medical History:  Diagnosis Date  Anxiety  Asthma without status asthmaticus (HHS-HCC)  GERD (gastroesophageal reflux disease)  History of chest pain  Work up with Myoview study 12/11/2002 showing fixed anterior defect with no reversibility and low probability with ischemia, as well as normal LV function by Dr. Lady Gary.  History of recurrent cystitis  Hyperlipidemia  Hypertension  Infection of prosthetic hip joint (CMS-HCC)  Obesity  Osteopenia  DEXA scan 01/04/2013  Thyroid disease   Past Surgical History She has a past surgical history that includes Cholecystectomy; Irrigation and debridement of right hip (Right, 09/15/2012); Right breast biopsies x 2 (Right); Bladder stem dilation; Left hip hemiarthroplasty  (Left, 07/06/2009); Right hip hemiarthroplasty (Right, 01/21/2012); Right hip incision and drainage of postop hematoma (Right, 02/18/2012); Conversion of left hip hemiarthroplasty to total hip arthroplasty (Left, 03/27/2014); Lumbar fusion surgery (01/21/2016); Hysterectomy; sacrospinous vault suspension, anterior-posterior repair. (2004); and Open reduction and internal fixation of of the right distal radial fracture (10/05/2019).   Medications and Allergies  Current Medications  Current Outpatient Medications  Medication Sig Dispense Refill  acetaminophen (TYLENOL) 500 MG tablet Take 1,000 mg by mouth 2 (two) times daily.  apixaban (ELIQUIS) 5 mg tablet Take 5 mg by mouth 2 (two) times daily  atorvastatin (LIPITOR) 40 MG tablet Take 40 mg by mouth once daily  calcium carbonate-vitamin D3 (CALTRATE 600+D) 600 mg(1,500mg ) -400 unit tablet Take 2 tablets by mouth once daily.  fexofenadine (ALLEGRA) 180 MG tablet Take 180 mg by mouth once daily.  fluticasone propion-salmeteroL (ADVAIR DISKUS) 100-50 mcg/dose diskus inhaler Inhale 1 inhalation into the lungs every 12 (twelve) hours 180 each 1  fluticasone propionate (FLONASE) 50 mcg/actuation nasal spray TAKE TWO PUFFS EACH NOSTRIL DAILY. 48 g 0  methIMAzole (TAPAZOLE) 5 MG tablet Take 0.5 tablets (2.5 mg total) by mouth once daily 15 tablet 6  metoprolol SUCCinate (TOPROL-XL) 100 MG XL tablet Take 1 tablet (100 mg total) by mouth once daily Take in addition to Metoprolol XL 25 mg 30 tablet 11  metoprolol SUCCinate (TOPROL-XL) 25 MG XL tablet Take 1 tablet (25 mg total) by mouth once daily Take in addition to Metoprolol XL 100 mg. 90 tablet 3  omeprazole (PRILOSEC) 20 MG DR capsule TAKE (1) CAPSULE TWICE DAILY. 180 capsule 0  sennosides-docusate (SENOKOT-S) 8.6-50 mg tablet Take 1 tablet by mouth 2 (two) times daily  UNABLE TO FIND Blu Emu cream  amoxicillin (AMOXIL) 500 MG capsule Take 1 capsule (500 mg total) by mouth 2 (  two) times daily (Patient  not taking: Reported on 07/22/2023) 180 capsule 2   No current facility-administered medications for this visit.   Allergies: Adhesive tape-silicones, Ciprofloxacin, Macrodantin [nitrofurantoin macrocrystalline], Phenazopyridine, Sulfa (sulfonamide antibiotics), and Zantac [ranitidine hcl]  Social and Family History  Social History reports that she has never smoked. She has never been exposed to tobacco smoke. She has never used smokeless tobacco. She reports that she does not drink alcohol and does not use drugs.  Family History Family History  Problem Relation Name Age of Onset  Stroke Mother  TIA  Leukemia Father  Stroke Father  Myocardial Infarction (Heart attack) Father  Diabetes type II Brother  Myocardial Infarction (Heart attack) Brother  Myocardial Infarction (Heart attack) Other  Sibling   Review of Systems   Review of Systems:  Exertional dyspnea Palpitations  Physical Examination   Vitals:BP (!) 142/88 (BP Location: Left upper arm, Patient Position: Sitting, BP Cuff Size: Adult)  Pulse 107  Resp 16  Ht 154.9 cm (5\' 1" )  Wt 70.8 kg (156 lb)  LMP (LMP Unknown) Comment: Hysterectomy  SpO2 96%  BMI 29.48 kg/m  Ht:154.9 cm (5\' 1" ) Wt:70.8 kg (156 lb) ZOX:WRUE surface area is 1.75 meters squared. Body mass index is 29.48 kg/m.  HEENT: Pupils equally reactive to light and accomodation  Neck: Supple, no significant JVD Lungs: clear to auscultation bilaterally; no wheezes, rales, rhonchi Heart: Regular rate and rhythm. No murmur Extremities: no pedal edema  Assessment and Plan   89 y.o. female with  Symptomatic persistent atrial fibrillation Recent heart cath with no significant CAD Hypertension, hyperlipidemia Hyperthyroidism on methimazole  With worsening exertional dyspnea- symptomatic persistent atrial fibrillation, will proceed with attempt at rhythm control strategy. Will proceed with TEE/cardioversion. Patient agreeable with plan. Came for TEE/CV  today.   Continue current metoprolol. Continue Eliquis for anticoagulation   Windell Norfolk, MD [facility]

## 2023-09-19 MED ORDER — SODIUM CHLORIDE 0.9 % IV SOLN: INTRAVENOUS | Status: DC

## 2023-09-20 ENCOUNTER — Ambulatory Visit
Admission: RE | Admit: 2023-09-20 | Discharge: 2023-09-20 | Disposition: A | Discharge status: Home / Self Care | Payer: Medicare PPO | Source: Home / Self Care | Attending: Student | Admitting: Student

## 2023-09-20 ENCOUNTER — Ambulatory Visit: Payer: Medicare PPO | Admitting: Anesthesiology

## 2023-09-20 ENCOUNTER — Other Ambulatory Visit: Payer: Self-pay

## 2023-09-20 ENCOUNTER — Encounter
Admission: RE | Disposition: A | Discharge status: Home / Self Care | Payer: Self-pay | Source: Home / Self Care | Attending: Cardiology

## 2023-09-20 ENCOUNTER — Ambulatory Visit
Admission: RE | Admit: 2023-09-20 | Discharge: 2023-09-20 | Disposition: A | Discharge status: Home / Self Care | Payer: Medicare PPO | Attending: Cardiology | Admitting: Cardiology

## 2023-09-20 DIAGNOSIS — E785 Hyperlipidemia, unspecified: Secondary | ICD-10-CM | POA: Diagnosis not present

## 2023-09-20 DIAGNOSIS — Z79899 Other long term (current) drug therapy: Secondary | ICD-10-CM | POA: Diagnosis not present

## 2023-09-20 DIAGNOSIS — Z8249 Family history of ischemic heart disease and other diseases of the circulatory system: Secondary | ICD-10-CM | POA: Diagnosis not present

## 2023-09-20 DIAGNOSIS — K219 Gastro-esophageal reflux disease without esophagitis: Secondary | ICD-10-CM | POA: Diagnosis not present

## 2023-09-20 DIAGNOSIS — I10 Essential (primary) hypertension: Secondary | ICD-10-CM

## 2023-09-20 DIAGNOSIS — I513 Intracardiac thrombosis, not elsewhere classified: Secondary | ICD-10-CM | POA: Diagnosis not present

## 2023-09-20 DIAGNOSIS — E0590 Thyrotoxicosis, unspecified without thyrotoxic crisis or storm: Secondary | ICD-10-CM | POA: Diagnosis not present

## 2023-09-20 DIAGNOSIS — I4819 Other persistent atrial fibrillation: Secondary | ICD-10-CM | POA: Diagnosis present

## 2023-09-20 DIAGNOSIS — I4891 Unspecified atrial fibrillation: Secondary | ICD-10-CM | POA: Diagnosis not present

## 2023-09-20 DIAGNOSIS — I214 Non-ST elevation (NSTEMI) myocardial infarction: Secondary | ICD-10-CM | POA: Diagnosis not present

## 2023-09-20 HISTORY — PX: CARDIOVERSION: SHX1299

## 2023-09-20 HISTORY — PX: TEE WITHOUT CARDIOVERSION: SHX5443

## 2023-09-20 LAB — ECHO TEE

## 2023-09-20 SURGERY — ECHOCARDIOGRAM, TRANSESOPHAGEAL
Anesthesia: General

## 2023-09-20 MED ORDER — LIDOCAINE HCL (CARDIAC) PF 100 MG/5ML IV SOSY
PREFILLED_SYRINGE | INTRAVENOUS | Status: DC | PRN
  Administered 2023-09-20: 30 mg via INTRAVENOUS

## 2023-09-20 MED ORDER — PROPOFOL 500 MG/50ML IV EMUL
INTRAVENOUS | Status: DC | PRN
  Administered 2023-09-20: 20 mg via INTRAVENOUS
  Administered 2023-09-20: 40 mg via INTRAVENOUS

## 2023-09-20 MED ORDER — LIDOCAINE VISCOUS HCL 2 % MT SOLN
OROMUCOSAL | Status: AC
  Filled 2023-09-20: qty 15

## 2023-09-20 MED ORDER — EPHEDRINE 5 MG/ML INJ
INTRAVENOUS | Status: AC
  Filled 2023-09-20: qty 5

## 2023-09-20 MED ORDER — BUTAMBEN-TETRACAINE-BENZOCAINE 2-2-14 % EX AERO
INHALATION_SPRAY | CUTANEOUS | Status: AC
  Filled 2023-09-20: qty 5

## 2023-09-20 MED ORDER — PROPOFOL 10 MG/ML IV BOLUS
INTRAVENOUS | Status: AC
  Filled 2023-09-20: qty 20

## 2023-09-20 MED ORDER — PHENYLEPHRINE 80 MCG/ML (10ML) SYRINGE FOR IV PUSH (FOR BLOOD PRESSURE SUPPORT)
PREFILLED_SYRINGE | INTRAVENOUS | Status: AC
  Filled 2023-09-20: qty 10

## 2023-09-20 MED ORDER — LIDOCAINE HCL (PF) 2 % IJ SOLN
INTRAMUSCULAR | Status: AC
  Filled 2023-09-20: qty 5

## 2023-09-20 NOTE — Anesthesia Preprocedure Evaluation (Addendum)
Anesthesia Evaluation  Patient identified by MRN, date of birth, ID band Patient awake    Reviewed: Allergy & Precautions, NPO status , Patient's Chart, lab work & pertinent test results  Airway Mallampati: III  TM Distance: >3 FB Neck ROM: full    Dental  (+) Teeth Intact   Pulmonary neg pulmonary ROS, asthma    Pulmonary exam normal  + decreased breath sounds      Cardiovascular Exercise Tolerance: Poor hypertension, Pt. on medications + DOE  negative cardio ROS Normal cardiovascular exam+ dysrhythmias Atrial Fibrillation  Rhythm:Irregular Rate:Abnormal     Neuro/Psych   Anxiety     negative neurological ROS  negative psych ROS   GI/Hepatic negative GI ROS, Neg liver ROS,GERD  Medicated,,  Endo/Other  negative endocrine ROS Hyperthyroidism   Renal/GU negative Renal ROS  negative genitourinary   Musculoskeletal   Abdominal Normal abdominal exam  (+)   Peds negative pediatric ROS (+)  Hematology negative hematology ROS (+)   Anesthesia Other Findings Past Medical History: No date: Anxiety No date: Arthritis No date: Asthma No date: Cancer [facility])     Comment:  melanoma skin cancer on head No date: Diastolic dysfunction     Comment:  a. 05/2023 Echo: EF 70-76^, no rwma, GrII DD, nl RV fxn,               mildly dil RA, mod dil LA. Mild MR. Mod AI. No date: GERD (gastroesophageal reflux disease) No date: History of recurrent cystitis No date: Hyperlipidemia No date: Hypertension No date: Infection of prosthetic hip joint (HCC) No date: Mitral regurgitation No date: Moderate aortic insufficiency No date: Nocturia No date: Obesity No date: Osteopenia No date: Persistent atrial fibrillation (HCC) No date: Thyroid disease No date: Urinary frequency No date: Urinary incontinence No date: Urinary urgency No date: Wears glasses  Past Surgical History: No date: ABDOMINAL HYSTERECTOMY 01/21/2016: BACK  SURGERY     Comment:  lumbar fusion No date: bladder stem dilation No date: BREAST BIOPSY; Right     Comment:  x2 No date: BREAST BIOPSY; Right     Comment:  x2 08/09/2023: CARDIOVERSION; N/A     Comment:  Procedure: CARDIOVERSION;  Surgeon: Alluri, Meryl Dare,               MD;  Location: [facility] ORS;  Service: Cardiovascular;                Laterality: N/A; No date: CHOLECYSTECTOMY 03/27/2014: conversion of hemiarthroplasty to total hip arthroplasty;  Left 2010: HEMIARTHROPLASTY HIP; Left 2013: HEMIARTHROPLASTY HIP; Right     Comment:  Dr. Rosita Kea 09/15/2012: INCISION AND DRAINAGE HIP; Right 2013: INCISION AND DRAINAGE HIP; Right     Comment:  Dr. Rosita Kea 07/16/2023: LEFT HEART CATH AND CORONARY ANGIOGRAPHY; N/A     Comment:  Procedure: LEFT HEART CATH AND CORONARY ANGIOGRAPHY;                Surgeon: Iran Ouch, MD;  Location: [facility] INVASIVE               CV LAB;  Service: Cardiovascular;  Laterality: N/A; 01/21/2016: LUMBAR FUSION     Comment:  L4-L5 by Dr. Danielle Dess No date: MELANOMA EXCISION     Comment:  skin cancer on top of head 10/05/2019: OPEN REDUCTION INTERNAL FIXATION (ORIF) DISTAL RADIAL  FRACTURE; Right     Comment:  Procedure: OPEN REDUCTION INTERNAL FIXATION (ORIF)  DISTAL RADIAL FRACTURE;  Surgeon: Kennedy Bucker, MD;                Location: [facility] ORS;  Service: Orthopedics;  Laterality:               Right; 08/09/2023: TEE WITHOUT CARDIOVERSION; N/A     Comment:  Procedure: TRANSESOPHAGEAL ECHOCARDIOGRAM (TEE);                Surgeon: Alluri, Meryl Dare, MD;  Location: [facility] ORS;                Service: Cardiovascular;  Laterality: N/A;     Reproductive/Obstetrics negative OB ROS                             Anesthesia Physical Anesthesia Plan  ASA: 3  Anesthesia Plan: General   Post-op Pain Management:    Induction: Intravenous  PONV Risk Score and Plan: Propofol infusion and TIVA  Airway Management Planned: Natural  Airway and Nasal Cannula  Additional Equipment:   Intra-op Plan:   Post-operative Plan:   Informed Consent: I have reviewed the patients History and Physical, chart, labs and discussed the procedure including the risks, benefits and alternatives for the proposed anesthesia with the patient or authorized representative who has indicated his/her understanding and acceptance.     Dental Advisory Given  Plan Discussed with: CRNA  Anesthesia Plan Comments:        Anesthesia Quick Evaluation

## 2023-09-20 NOTE — Transfer of Care (Signed)
Immediate Anesthesia Transfer of Care Note  Patient: Andrea Griffin  Procedure(s) Performed: TRANSESOPHAGEAL ECHOCARDIOGRAM (TEE) CARDIOVERSION  Patient Location: PACU  Anesthesia Type:MAC  Level of Consciousness: sedated  Airway & Oxygen Therapy: Patient Spontanous Breathing and Patient connected to nasal cannula oxygen  Post-op Assessment: Report given to RN and Post -op Vital signs reviewed and stable  Post vital signs: stable  Last Vitals:  Vitals Value Taken Time  BP 133/79   Temp 97   Pulse 89   Resp 20   SpO2 100     Last Pain:  Vitals:   09/20/23 1154  TempSrc: Oral  PainSc: 0-No pain         Complications: No notable events documented.

## 2023-09-20 NOTE — Progress Notes (Signed)
Cardioversion was not done, due to clot found during TEE.

## 2023-09-20 NOTE — Progress Notes (Signed)
*  PRELIMINARY RESULTS* Echocardiogram Echocardiogram Transesophageal has been performed.  Cristela Blue 09/20/2023, 2:20 PM

## 2023-09-20 NOTE — H&P (Signed)
History and Physical:  89 yr old with Past medical history significant for recently diagnosed atrial fibrillation, hypertension, hyperlipidemia, hyperthyroidism. She was recently admitted to hospital 07/2023 with symptoms of shortness of breath, palpitations. Noted to be in A-fib with RVR, elevated troponin, underwent left heart catheterization which showed no significant CAD, normal LVEDP. Echocardiogram with normal LVEF of 55 to 60%, mild RV dysfunction, moderate biatrial enlargement, moderate mitral regurgitation, mild to moderate TR. Timing of A-fib onset was unclear, managed with rate control strategy. Due to significant exertional dyspnea, TEE/cardioversion was planned. TEE 08/09/2023 showed LVEF of 50 to 55% with left atrial appendage thrombus, so cardioversion not performed. As she was on Eliquis for only 3 weeks, it was continued.  Continues to have significant exertional dyspnea. Has intermittent palpitation. Also complains of left-sided/midsternal chest discomfort, no relation to exertion. EKG today showed atrial fibrillation with heart rate of 85 bpm.  Past Medical and Surgical History  Past Medical History Past Medical History:  Diagnosis Date  Anxiety  Asthma without status asthmaticus (HHS-HCC)  GERD (gastroesophageal reflux disease)  History of chest pain  Work up with Myoview study 12/11/2002 showing fixed anterior defect with no reversibility and low probability with ischemia, as well as normal LV function by Dr. Lady Gary.  History of recurrent cystitis  Hyperlipidemia  Hypertension  Infection of prosthetic hip joint (CMS-HCC)  Obesity  Osteopenia  DEXA scan 01/04/2013  Thyroid disease   Past Surgical History She has a past surgical history that includes Cholecystectomy; Irrigation and debridement of right hip (Right, 09/15/2012); Right breast biopsies x 2 (Right); Bladder stem dilation; Left hip hemiarthroplasty (Left, 07/06/2009); Right hip hemiarthroplasty (Right,  01/21/2012); Right hip incision and drainage of postop hematoma (Right, 02/18/2012); Conversion of left hip hemiarthroplasty to total hip arthroplasty (Left, 03/27/2014); Lumbar fusion surgery (01/21/2016); Hysterectomy; sacrospinous vault suspension, anterior-posterior repair. (2004); and Open reduction and internal fixation of of the right distal radial fracture (10/05/2019).   Medications and Allergies  Current Medications  Current Outpatient Medications  Medication Sig Dispense Refill  acetaminophen (TYLENOL) 500 MG tablet Take 1,000 mg by mouth 2 (two) times daily.  amoxicillin (AMOXIL) 500 MG capsule Take 1 capsule (500 mg total) by mouth 2 (two) times daily 180 capsule 2  apixaban (ELIQUIS) 5 mg tablet Take 1 tablet (5 mg total) by mouth 2 (two) times daily 60 tablet 11  atorvastatin (LIPITOR) 20 MG tablet TAKE 1 TABLET DAILY IN THE EVENING. 90 tablet 0  atorvastatin (LIPITOR) 40 MG tablet Take 40 mg by mouth once daily  calcium carbonate-vitamin D3 (CALTRATE 600+D) 600 mg(1,500mg ) -400 unit tablet Take 2 tablets by mouth once daily.  escitalopram oxalate (LEXAPRO) 5 MG tablet Take 1 tablet (5 mg total) by mouth once daily (Patient taking differently: Take 5 mg by mouth at bedtime) 30 tablet 1  fexofenadine (ALLEGRA) 180 MG tablet Take 180 mg by mouth once daily.  fluticasone propion-salmeteroL (ADVAIR DISKUS) 100-50 mcg/dose diskus inhaler Inhale 1 inhalation into the lungs every 12 (twelve) hours 180 each 1  fluticasone propionate (FLONASE) 50 mcg/actuation nasal spray TAKE TWO PUFFS EACH NOSTRIL DAILY. 48 g 0  methIMAzole (TAPAZOLE) 5 MG tablet Take 0.5 tablets (2.5 mg total) by mouth once daily 15 tablet 6  metoprolol SUCCinate (TOPROL-XL) 100 MG XL tablet Take 1 tablet (100 mg total) by mouth once daily Take in addition to Metoprolol XL 25 mg 90 tablet 3  metoprolol SUCCinate (TOPROL-XL) 25 MG XL tablet Take 1 tablet (25 mg total) by mouth once daily  Take in addition to Metoprolol XL  100 mg. 90 tablet 3  omeprazole (PRILOSEC) 20 MG DR capsule TAKE (1) CAPSULE TWICE DAILY. 180 capsule 0  sennosides-docusate (SENOKOT-S) 8.6-50 mg tablet Take 1 tablet by mouth 2 (two) times daily  UNABLE TO FIND Blu Emu cream   No current facility-administered medications for this visit.   Allergies: Adhesive tape-silicones, Ciprofloxacin, Macrodantin [nitrofurantoin macrocrystalline], Phenazopyridine, Sulfa (sulfonamide antibiotics), and Zantac [ranitidine hcl]  Social and Family History  Social History reports that she has never smoked. She has never been exposed to tobacco smoke. She has never used smokeless tobacco. She reports that she does not drink alcohol and does not use drugs.  Family History Family History  Problem Relation Name Age of Onset  Stroke Mother  TIA  Leukemia Father  Stroke Father  Myocardial Infarction (Heart attack) Father  Diabetes type II Brother  Myocardial Infarction (Heart attack) Brother  Myocardial Infarction (Heart attack) Other  Sibling   Review of Systems   Review of Systems:  Exertional dyspnea Palpitations  Physical Examination   Vitals:BP 132/82 (BP Location: Left upper arm, Patient Position: Sitting, BP Cuff Size: Adult)  Pulse 85  Resp 16  Ht 154.9 cm (5\' 1" )  Wt 73 kg (161 lb)  LMP (LMP Unknown) Comment: Hysterectomy  SpO2 94%  BMI 30.42 kg/m  Ht:154.9 cm (5\' 1" ) Wt:73 kg (161 lb) GNF:AOZH surface area is 1.77 meters squared. Body mass index is 30.42 kg/m.  HEENT: Pupils equally reactive to light and accomodation  Neck: Supple, no significant JVD Lungs: clear to auscultation bilaterally; no wheezes, rales, rhonchi Heart: Irregular rhythm, normal rate. No murmur Extremities: no pedal edema  Assessment and Plan   89 y.o. female with  Symptomatic persistent atrial fibrillation Left atrial appendage thrombus Recent heart cath with no significant CAD Hypertension, hyperlipidemia Hyperthyroidism on methimazole  Plan for  TEE and if LAA thrombus resolves, will proceed with cardioversion. Continue current medications.  Windell Norfolk, MD [facility] Cardiology- [facility]

## 2023-09-20 NOTE — Anesthesia Postprocedure Evaluation (Signed)
Anesthesia Post Note  Patient: Andrea Griffin  Procedure(s) Performed: TRANSESOPHAGEAL ECHOCARDIOGRAM (TEE) CARDIOVERSION  Patient location during evaluation: PACU Anesthesia Type: General Level of consciousness: awake Pain management: satisfactory to patient Vital Signs Assessment: post-procedure vital signs reviewed and stable Respiratory status: spontaneous breathing Cardiovascular status: blood pressure returned to baseline Anesthetic complications: no   No notable events documented.   Last Vitals:  Vitals:   09/20/23 1245 09/20/23 1300  BP: (!) 152/82 (!) 153/82  Pulse: 86 100  Resp: 20 (!) 24  Temp:    SpO2: 95% 94%    Last Pain:  Vitals:   09/20/23 1300  TempSrc:   PainSc: 0-No pain                 VAN STAVEREN,Collyn Selk

## 2023-09-21 ENCOUNTER — Encounter: Payer: Self-pay | Admitting: Cardiology

## 2023-09-30 ENCOUNTER — Emergency Department
Admission: EM | Admit: 2023-09-30 | Discharge: 2023-10-01 | Disposition: A | Discharge status: Home / Self Care | Payer: Medicare PPO | Attending: Emergency Medicine | Admitting: Emergency Medicine

## 2023-09-30 ENCOUNTER — Other Ambulatory Visit: Payer: Self-pay

## 2023-09-30 DIAGNOSIS — E876 Hypokalemia: Secondary | ICD-10-CM | POA: Diagnosis not present

## 2023-09-30 DIAGNOSIS — J45909 Unspecified asthma, uncomplicated: Secondary | ICD-10-CM | POA: Diagnosis not present

## 2023-09-30 DIAGNOSIS — I119 Hypertensive heart disease without heart failure: Secondary | ICD-10-CM | POA: Diagnosis not present

## 2023-09-30 DIAGNOSIS — I4891 Unspecified atrial fibrillation: Secondary | ICD-10-CM | POA: Insufficient documentation

## 2023-09-30 DIAGNOSIS — R0989 Other specified symptoms and signs involving the circulatory and respiratory systems: Secondary | ICD-10-CM | POA: Insufficient documentation

## 2023-09-30 DIAGNOSIS — K529 Noninfective gastroenteritis and colitis, unspecified: Secondary | ICD-10-CM | POA: Diagnosis not present

## 2023-09-30 DIAGNOSIS — Z20822 Contact with and (suspected) exposure to covid-19: Secondary | ICD-10-CM | POA: Insufficient documentation

## 2023-09-30 DIAGNOSIS — I4819 Other persistent atrial fibrillation: Secondary | ICD-10-CM | POA: Diagnosis not present

## 2023-09-30 DIAGNOSIS — R0602 Shortness of breath: Secondary | ICD-10-CM | POA: Insufficient documentation

## 2023-09-30 DIAGNOSIS — E785 Hyperlipidemia, unspecified: Secondary | ICD-10-CM | POA: Diagnosis not present

## 2023-09-30 DIAGNOSIS — Z79899 Other long term (current) drug therapy: Secondary | ICD-10-CM | POA: Insufficient documentation

## 2023-09-30 DIAGNOSIS — Z7901 Long term (current) use of anticoagulants: Secondary | ICD-10-CM | POA: Insufficient documentation

## 2023-09-30 DIAGNOSIS — I10 Essential (primary) hypertension: Secondary | ICD-10-CM | POA: Insufficient documentation

## 2023-09-30 DIAGNOSIS — R112 Nausea with vomiting, unspecified: Secondary | ICD-10-CM | POA: Diagnosis present

## 2023-09-30 DIAGNOSIS — R000 Tachycardia, unspecified: Secondary | ICD-10-CM | POA: Insufficient documentation

## 2023-09-30 LAB — CBC WITH DIFFERENTIAL/PLATELET
Abs Immature Granulocytes: 0.03 10*3/uL (ref 0.00–0.07)
Basophils Absolute: 0 10*3/uL (ref 0.0–0.1)
Basophils Relative: 0 %
Eosinophils Absolute: 0 10*3/uL (ref 0.0–0.5)
Eosinophils Relative: 0 %
HCT: 40.5 % (ref 36.0–46.0)
Hemoglobin: 13.6 g/dL (ref 12.0–15.0)
Immature Granulocytes: 0 %
Lymphocytes Relative: 8 %
Lymphs Abs: 0.8 10*3/uL (ref 0.7–4.0)
MCH: 31 pg (ref 26.0–34.0)
MCHC: 33.6 g/dL (ref 30.0–36.0)
MCV: 92.3 fL (ref 80.0–100.0)
Monocytes Absolute: 0.3 10*3/uL (ref 0.1–1.0)
Monocytes Relative: 3 %
Neutro Abs: 8.4 10*3/uL — ABNORMAL HIGH (ref 1.7–7.7)
Neutrophils Relative %: 89 %
Platelets: 301 10*3/uL (ref 150–400)
RBC: 4.39 MIL/uL (ref 3.87–5.11)
RDW: 14.7 % (ref 11.5–15.5)
WBC: 9.6 10*3/uL (ref 4.0–10.5)
nRBC: 0 % (ref 0.0–0.2)

## 2023-09-30 LAB — PROTIME-INR
INR: 1.5 — ABNORMAL HIGH (ref 0.8–1.2)
Prothrombin Time: 18.4 s — ABNORMAL HIGH (ref 11.4–15.2)

## 2023-09-30 LAB — BASIC METABOLIC PANEL
Anion gap: 15 (ref 5–15)
BUN: 17 mg/dL (ref 8–23)
CO2: 27 mmol/L (ref 22–32)
Calcium: 8.8 mg/dL — ABNORMAL LOW (ref 8.9–10.3)
Chloride: 95 mmol/L — ABNORMAL LOW (ref 98–111)
Creatinine, Ser: 0.43 mg/dL — ABNORMAL LOW (ref 0.44–1.00)
GFR, Estimated: >60 mL/min (ref >60)
Glucose, Bld: 114 mg/dL — ABNORMAL HIGH (ref 70–99)
Potassium: 3.3 mmol/L — ABNORMAL LOW (ref 3.5–5.1)
Sodium: 137 mmol/L (ref 135–145)

## 2023-09-30 LAB — HEPATIC FUNCTION PANEL
ALT: 23 U/L (ref 0–44)
AST: 33 U/L (ref 15–41)
Albumin: 3.7 g/dL (ref 3.5–5.0)
Alkaline Phosphatase: 78 U/L (ref 38–126)
Bilirubin, Direct: 0.3 mg/dL — ABNORMAL HIGH (ref 0.0–0.2)
Indirect Bilirubin: 1.2 mg/dL — ABNORMAL HIGH (ref 0.3–0.9)
Total Bilirubin: 1.5 mg/dL — ABNORMAL HIGH (ref 0.0–1.2)
Total Protein: 7 g/dL (ref 6.5–8.1)

## 2023-09-30 LAB — LIPASE, BLOOD: Lipase: 28 U/L (ref 11–51)

## 2023-09-30 LAB — RESP PANEL BY RT-PCR (RSV, FLU A&B, COVID)  RVPGX2
Influenza A by PCR: NEGATIVE
Influenza B by PCR: NEGATIVE
Resp Syncytial Virus by PCR: NEGATIVE
SARS Coronavirus 2 by RT PCR: NEGATIVE

## 2023-09-30 LAB — MAGNESIUM: Magnesium: 1.5 mg/dL — ABNORMAL LOW (ref 1.7–2.4)

## 2023-09-30 MED ORDER — ONDANSETRON 4 MG PO TBDP: 4.0000 mg | ORAL_TABLET | Freq: Three times a day (TID) | ORAL | 0 refills | Status: DC | PRN

## 2023-09-30 MED ORDER — METOPROLOL SUCCINATE ER 50 MG PO TB24
100.0000 mg | ORAL_TABLET | Freq: Once | ORAL | Status: AC
  Administered 2023-09-30: 100 mg via ORAL
  Filled 2023-09-30: qty 2

## 2023-09-30 MED ORDER — POTASSIUM CHLORIDE CRYS ER 20 MEQ PO TBCR
40.0000 meq | EXTENDED_RELEASE_TABLET | Freq: Once | ORAL | Status: AC
  Administered 2023-09-30: 40 meq via ORAL
  Filled 2023-09-30: qty 2

## 2023-09-30 MED ORDER — LACTATED RINGERS IV BOLUS
1000.0000 mL | Freq: Once | INTRAVENOUS | Status: AC
  Administered 2023-09-30: 1000 mL via INTRAVENOUS

## 2023-09-30 MED ORDER — ONDANSETRON 4 MG PO TBDP
4.0000 mg | ORAL_TABLET | Freq: Once | ORAL | Status: AC
  Administered 2023-09-30: 4 mg via ORAL
  Filled 2023-09-30: qty 1

## 2023-09-30 MED ORDER — MAGNESIUM SULFATE 2 GM/50ML IV SOLN
2.0000 g | Freq: Once | INTRAVENOUS | Status: AC
  Administered 2023-09-30: 2 g via INTRAVENOUS
  Filled 2023-09-30: qty 50

## 2023-09-30 MED ORDER — METOPROLOL SUCCINATE ER 50 MG PO TB24
25.0000 mg | ORAL_TABLET | Freq: Once | ORAL | Status: AC
  Administered 2023-09-30: 25 mg via ORAL
  Filled 2023-09-30: qty 1

## 2023-09-30 NOTE — ED Triage Notes (Signed)
Arrives from [facility] for ED evaluation of afib RVR and CP.  Per report, HR initially 140's, now 80's.  Known history of afib.  AAOx3.  Skin warm and dry. NAD

## 2023-09-30 NOTE — ED Provider Notes (Addendum)
[facility] Provider Note    Event Date/Time   First MD Initiated Contact with Patient 09/30/23 2126     (approximate)   History   Chief Complaint Emesis   HPI  Andrea Griffin is a 89 y.o. female with past medical history of hypertension, hyperlipidemia, asthma, and atrial fibrillation on Eliquis who presents to the ED complaining of nausea and vomiting.  Patient reports that she has been dealing with nausea, vomiting, and diarrhea since waking up this morning.  She denies any associated pain in her abdomen and has not had any fevers, dysuria, or flank pain.  She does report that it has been difficult for her to keep down her home medications, including metoprolol.  She was initially evaluated at the walk-in clinic with [facility], subsequently referred to the ED due to atrial fibrillation with RVR.  Patient reports history of persistent atrial fibrillation, was scheduled for cardioversion earlier this month but was not performed after she was found to have cardiac thrombus.  She has been taking Coumadin since then.     Physical Exam   Triage Vital Signs: ED Triage Vitals  Encounter Vitals Group     BP 09/30/23 1909 (!) 138/95     Systolic BP Percentile --      Diastolic BP Percentile --      Pulse Rate 09/30/23 1909 61     Resp 09/30/23 1909 15     Temp 09/30/23 1914 98.9 F (37.2 C)     Temp Source 09/30/23 1914 Oral     SpO2 09/30/23 1909 98 %     Weight --      Height --      Head Circumference --      Peak Flow --      Pain Score 09/30/23 1909 0     Pain Loc --      Pain Education --      Exclude from Growth Chart --     Most recent vital signs: Vitals:   09/30/23 2127 09/30/23 2208  BP: (!) 146/79 (!) 140/89  Pulse: (!) 101 100  Resp: (!) 23 15  Temp: 99 F (37.2 C)   SpO2: 95% 94%    Constitutional: Alert and oriented. Eyes: Conjunctivae are normal. Head: Atraumatic. Nose: No congestion/rhinnorhea. Mouth/Throat: Mucous  membranes are moist.  Cardiovascular: Tachycardic, irregularly irregular rhythm. Grossly normal heart sounds.  2+ radial pulses bilaterally. Respiratory: Normal respiratory effort.  No retractions. Lungs CTAB. Gastrointestinal: Soft and nontender. No distention. Musculoskeletal: No lower extremity tenderness nor edema.  Neurologic:  Normal speech and language. No gross focal neurologic deficits are appreciated.    ED Results / Procedures / Treatments   Labs (all labs ordered are listed, but only abnormal results are displayed) Labs Reviewed  CBC WITH DIFFERENTIAL/PLATELET - Abnormal; Notable for the following components:      Result Value   Neutro Abs 8.4 (*)    All other components within normal limits  BASIC METABOLIC PANEL - Abnormal; Notable for the following components:   Potassium 3.3 (*)    Chloride 95 (*)    Glucose, Bld 114 (*)    Creatinine, Ser 0.43 (*)    Calcium 8.8 (*)    All other components within normal limits  MAGNESIUM - Abnormal; Notable for the following components:   Magnesium 1.5 (*)    All other components within normal limits  HEPATIC FUNCTION PANEL - Abnormal; Notable for the following components:   Total Bilirubin  1.5 (*)    Bilirubin, Direct 0.3 (*)    Indirect Bilirubin 1.2 (*)    All other components within normal limits  PROTIME-INR - Abnormal; Notable for the following components:   Prothrombin Time 18.4 (*)    INR 1.5 (*)    All other components within normal limits  RESP PANEL BY RT-PCR (RSV, FLU A&B, COVID)  RVPGX2  LIPASE, BLOOD     EKG  ED ECG REPORT I, Chesley Noon, the attending physician, personally viewed and interpreted this ECG.   Date: 09/30/2023  EKG Time: 19:54  Rate: 121  Rhythm: atrial fibrillation  Axis: Nromal  Intervals:none  ST&T Change: Inferior ST depressions  PROCEDURES:  Critical Care performed: No  Procedures   MEDICATIONS ORDERED IN ED: Medications  potassium chloride SA (KLOR-CON M) CR tablet  40 mEq (has no administration in time range)  magnesium sulfate IVPB 2 g 50 mL (has no administration in time range)  ondansetron (ZOFRAN-ODT) disintegrating tablet 4 mg (4 mg Oral Given 09/30/23 1916)  lactated ringers bolus 1,000 mL (1,000 mLs Intravenous New Bag/Given 09/30/23 2207)  metoprolol succinate (TOPROL-XL) 24 hr tablet 100 mg (100 mg Oral Given 09/30/23 2208)  metoprolol succinate (TOPROL-XL) 24 hr tablet 25 mg (25 mg Oral Given 09/30/23 2209)     IMPRESSION / MDM / ASSESSMENT AND PLAN / ED COURSE  I reviewed the triage vital signs and the nursing notes.                              89 y.o. female with past medical history of hypertension, hyperlipidemia, asthma, and atrial fibrillation on Eliquis who presents to the ED complaining of nausea, vomiting, and diarrhea since this morning.  Patient's presentation is most consistent with acute presentation with potential threat to life or bodily function.  Differential diagnosis includes, but is not limited to, gastroenteritis, dehydration, electrolyte abnormality, AKI, UTI, colitis, diverticulitis, appendicitis, atrial fibrillation.  Patient nontoxic-appearing and in no acute distress, vital signs are remarkable for tachycardia but otherwise reassuring.  Patient has a benign abdominal exam and symptoms he most consistent with a gastroenteritis.  She has had a hard time tolerating her typical medications, likely contributing to RVR in the setting of persistent atrial fibrillation.  She is now feeling better following a dose of Zofran, rate only mildly elevated in the 110s at this time will hydrate with IV fluids and give her typical daily dose of metoprolol.  Labs without significant anemia or leukocytosis, mild hypokalemia and hypomagnesemia noted without significant AKI.  LFTs with mild elevation in bilirubin, however this is a direct bilirubinemia and no findings concerning for biliary obstruction.  Heart rate now improved following IV  fluids and metoprolol, patient tolerating oral intake without difficulty, including all of her home medications.  INR is subtherapeutic, she was counseled to have this rechecked but is otherwise appropriate for outpatient management.  She was counseled to return to the ED for new or worsening symptoms.  Patient agrees with plan.  ----------------------------------------- 11:52 PM on 09/30/2023 ----------------------------------------- Patient found to have low oxygen saturation on room air, reports some mild difficulty breathing.  We will add on troponin and chest x-ray, patient turned over to oncoming provider pending additional results.      FINAL CLINICAL IMPRESSION(S) / ED DIAGNOSES   Final diagnoses:  Gastroenteritis  Atrial fibrillation with RVR (HCC)     Rx / DC Orders   ED Discharge Orders  Ordered    ondansetron (ZOFRAN-ODT) 4 MG disintegrating tablet  Every 8 hours PRN        09/30/23 2320             Note:  This document was prepared using Dragon voice recognition software and may include unintentional dictation errors.   Chesley Noon, MD 09/30/23 2353    Chesley Noon, MD 09/30/23 [phone]

## 2023-09-30 NOTE — ED Notes (Signed)
Called CCMD and placed pt on monitoring at this time.

## 2023-09-30 NOTE — ED Triage Notes (Signed)
Pt has had diarrhea, nausea, and vomiting that began this morning. Pt denies specific pain at this time.

## 2023-09-30 NOTE — ED Notes (Signed)
Called CCMD to monitor pt tele

## 2023-10-01 ENCOUNTER — Emergency Department: Payer: Medicare PPO

## 2023-10-01 ENCOUNTER — Other Ambulatory Visit: Payer: Medicare PPO

## 2023-10-01 LAB — TROPONIN I (HIGH SENSITIVITY): Troponin I (High Sensitivity): 10 ng/L (ref <18)

## 2023-10-01 NOTE — ED Provider Notes (Signed)
Care of this patient assumed from prior physician at 2330 pending x-Kasandra Fehr, troponin, reevaluation, and disposition. Please see prior physician note for further details.  Briefly this is an 89 year old female who presented with nausea and vomiting found to be in A-fib with RVR after missing her home metoprolol.  Patient was given Zofran, IV fluids, oral metoprolol here with significant improvement in her heart rate.  Initial plan had been for discharge, but she was noted to have mild hypoxia, so chest x-Victoire Deans and troponin were added on, signed out to me pending reevaluation following completion of these.   X-Vannary Greening with mild vascular congestion, no focal consolidation.  Troponin within normal limits.  Patient reevaluated and eager to be discharged home.  I was able to wean her to room air with sats maintaining in the mid 90s.  She denies any cardiorespiratory symptoms.  Do think she is stable for discharge.  Strict return precautions provided.   Trinna Post, MD 10/01/23 [phone]

## 2023-10-31 ENCOUNTER — Emergency Department

## 2023-10-31 ENCOUNTER — Inpatient Hospital Stay
Admission: EM | Admit: 2023-10-31 | Discharge: 2023-11-05 | DRG: 291 | Disposition: A | Discharge status: Home Health | Attending: Osteopathic Medicine | Admitting: Osteopathic Medicine

## 2023-10-31 ENCOUNTER — Observation Stay

## 2023-10-31 ENCOUNTER — Other Ambulatory Visit: Payer: Self-pay

## 2023-10-31 DIAGNOSIS — K219 Gastro-esophageal reflux disease without esophagitis: Secondary | ICD-10-CM | POA: Diagnosis present

## 2023-10-31 DIAGNOSIS — Z91048 Other nonmedicinal substance allergy status: Secondary | ICD-10-CM

## 2023-10-31 DIAGNOSIS — R4182 Altered mental status, unspecified: Principal | ICD-10-CM

## 2023-10-31 DIAGNOSIS — I509 Heart failure, unspecified: Secondary | ICD-10-CM

## 2023-10-31 DIAGNOSIS — R54 Age-related physical debility: Secondary | ICD-10-CM

## 2023-10-31 DIAGNOSIS — Z79899 Other long term (current) drug therapy: Secondary | ICD-10-CM

## 2023-10-31 DIAGNOSIS — E039 Hypothyroidism, unspecified: Secondary | ICD-10-CM | POA: Diagnosis present

## 2023-10-31 DIAGNOSIS — J90 Pleural effusion, not elsewhere classified: Secondary | ICD-10-CM

## 2023-10-31 DIAGNOSIS — I4819 Other persistent atrial fibrillation: Secondary | ICD-10-CM

## 2023-10-31 DIAGNOSIS — E871 Hypo-osmolality and hyponatremia: Secondary | ICD-10-CM | POA: Diagnosis present

## 2023-10-31 DIAGNOSIS — I110 Hypertensive heart disease with heart failure: Principal | ICD-10-CM | POA: Diagnosis present

## 2023-10-31 DIAGNOSIS — I10 Essential (primary) hypertension: Secondary | ICD-10-CM | POA: Diagnosis present

## 2023-10-31 DIAGNOSIS — J45909 Unspecified asthma, uncomplicated: Secondary | ICD-10-CM | POA: Diagnosis present

## 2023-10-31 DIAGNOSIS — Z85820 Personal history of malignant melanoma of skin: Secondary | ICD-10-CM

## 2023-10-31 DIAGNOSIS — I5023 Acute on chronic systolic (congestive) heart failure: Secondary | ICD-10-CM

## 2023-10-31 DIAGNOSIS — R4781 Slurred speech: Secondary | ICD-10-CM

## 2023-10-31 DIAGNOSIS — R531 Weakness: Secondary | ICD-10-CM

## 2023-10-31 DIAGNOSIS — R0602 Shortness of breath: Secondary | ICD-10-CM | POA: Diagnosis not present

## 2023-10-31 DIAGNOSIS — G459 Transient cerebral ischemic attack, unspecified: Secondary | ICD-10-CM | POA: Diagnosis present

## 2023-10-31 DIAGNOSIS — Z7951 Long term (current) use of inhaled steroids: Secondary | ICD-10-CM

## 2023-10-31 DIAGNOSIS — E785 Hyperlipidemia, unspecified: Secondary | ICD-10-CM | POA: Diagnosis present

## 2023-10-31 DIAGNOSIS — R4701 Aphasia: Secondary | ICD-10-CM | POA: Diagnosis present

## 2023-10-31 DIAGNOSIS — Z882 Allergy status to sulfonamides status: Secondary | ICD-10-CM

## 2023-10-31 DIAGNOSIS — Z7901 Long term (current) use of anticoagulants: Secondary | ICD-10-CM

## 2023-10-31 DIAGNOSIS — Z1152 Encounter for screening for COVID-19: Secondary | ICD-10-CM

## 2023-10-31 DIAGNOSIS — I513 Intracardiac thrombosis, not elsewhere classified: Secondary | ICD-10-CM

## 2023-10-31 DIAGNOSIS — Z66 Do not resuscitate: Secondary | ICD-10-CM | POA: Diagnosis present

## 2023-10-31 DIAGNOSIS — I428 Other cardiomyopathies: Secondary | ICD-10-CM

## 2023-10-31 DIAGNOSIS — E669 Obesity, unspecified: Secondary | ICD-10-CM | POA: Diagnosis present

## 2023-10-31 DIAGNOSIS — E0590 Thyrotoxicosis, unspecified without thyrotoxic crisis or storm: Secondary | ICD-10-CM | POA: Diagnosis present

## 2023-10-31 DIAGNOSIS — J9601 Acute respiratory failure with hypoxia: Secondary | ICD-10-CM

## 2023-10-31 DIAGNOSIS — Z90710 Acquired absence of both cervix and uterus: Secondary | ICD-10-CM

## 2023-10-31 DIAGNOSIS — Z96642 Presence of left artificial hip joint: Secondary | ICD-10-CM | POA: Diagnosis present

## 2023-10-31 DIAGNOSIS — Z823 Family history of stroke: Secondary | ICD-10-CM

## 2023-10-31 DIAGNOSIS — Z881 Allergy status to other antibiotic agents status: Secondary | ICD-10-CM

## 2023-10-31 DIAGNOSIS — I2510 Atherosclerotic heart disease of native coronary artery without angina pectoris: Secondary | ICD-10-CM

## 2023-10-31 DIAGNOSIS — F419 Anxiety disorder, unspecified: Secondary | ICD-10-CM | POA: Diagnosis present

## 2023-10-31 DIAGNOSIS — Z888 Allergy status to other drugs, medicaments and biological substances status: Secondary | ICD-10-CM

## 2023-10-31 LAB — CBC
HCT: 37.1 % (ref 36.0–46.0)
Hemoglobin: 12.2 g/dL (ref 12.0–15.0)
MCH: 30.2 pg (ref 26.0–34.0)
MCHC: 32.9 g/dL (ref 30.0–36.0)
MCV: 91.8 fL (ref 80.0–100.0)
Platelets: 342 10*3/uL (ref 150–400)
RBC: 4.04 MIL/uL (ref 3.87–5.11)
RDW: 14.9 % (ref 11.5–15.5)
WBC: 8.4 10*3/uL (ref 4.0–10.5)
nRBC: 0 % (ref 0.0–0.2)

## 2023-10-31 LAB — COMPREHENSIVE METABOLIC PANEL
ALT: 22 U/L (ref 0–44)
AST: 29 U/L (ref 15–41)
Albumin: 3.6 g/dL (ref 3.5–5.0)
Alkaline Phosphatase: 79 U/L (ref 38–126)
Anion gap: 9 (ref 5–15)
BUN: 12 mg/dL (ref 8–23)
CO2: 26 mmol/L (ref 22–32)
Calcium: 8.7 mg/dL — ABNORMAL LOW (ref 8.9–10.3)
Chloride: 94 mmol/L — ABNORMAL LOW (ref 98–111)
Creatinine, Ser: 0.45 mg/dL (ref 0.44–1.00)
GFR, Estimated: >60 mL/min (ref >60)
Glucose, Bld: 102 mg/dL — ABNORMAL HIGH (ref 70–99)
Potassium: 3.7 mmol/L (ref 3.5–5.1)
Sodium: 129 mmol/L — ABNORMAL LOW (ref 135–145)
Total Bilirubin: 1.5 mg/dL — ABNORMAL HIGH (ref 0.0–1.2)
Total Protein: 7.2 g/dL (ref 6.5–8.1)

## 2023-10-31 LAB — URINALYSIS, ROUTINE W REFLEX MICROSCOPIC
Bacteria, UA: NONE SEEN
Bilirubin Urine: NEGATIVE
Glucose, UA: NEGATIVE mg/dL
Hgb urine dipstick: NEGATIVE
Ketones, ur: NEGATIVE mg/dL
Leukocytes,Ua: NEGATIVE
Nitrite: NEGATIVE
Protein, ur: 100 mg/dL — AB
Specific Gravity, Urine: 1.018 (ref 1.005–1.030)
Squamous Epithelial / HPF: 0 /HPF (ref 0–5)
pH: 8 (ref 5.0–8.0)

## 2023-10-31 LAB — BRAIN NATRIURETIC PEPTIDE: B Natriuretic Peptide: 818.6 pg/mL — ABNORMAL HIGH (ref 0.0–100.0)

## 2023-10-31 LAB — DIFFERENTIAL
Abs Immature Granulocytes: 0.03 10*3/uL (ref 0.00–0.07)
Basophils Absolute: 0.1 10*3/uL (ref 0.0–0.1)
Basophils Relative: 1 %
Eosinophils Absolute: 0 10*3/uL (ref 0.0–0.5)
Eosinophils Relative: 0 %
Immature Granulocytes: 0 %
Lymphocytes Relative: 23 %
Lymphs Abs: 2 10*3/uL (ref 0.7–4.0)
Monocytes Absolute: 0.7 10*3/uL (ref 0.1–1.0)
Monocytes Relative: 8 %
Neutro Abs: 5.7 10*3/uL (ref 1.7–7.7)
Neutrophils Relative %: 68 %

## 2023-10-31 LAB — BLOOD GAS, VENOUS
Acid-Base Excess: 2 mmol/L (ref 0.0–2.0)
Bicarbonate: 26.5 mmol/L (ref 20.0–28.0)
O2 Saturation: 89.5 %
Patient temperature: 37
pCO2, Ven: 40 mmHg — ABNORMAL LOW (ref 44–60)
pH, Ven: 7.43 (ref 7.25–7.43)
pO2, Ven: 59 mmHg — ABNORMAL HIGH (ref 32–45)

## 2023-10-31 LAB — RESP PANEL BY RT-PCR (RSV, FLU A&B, COVID)  RVPGX2
Influenza A by PCR: NEGATIVE
Influenza B by PCR: NEGATIVE
Resp Syncytial Virus by PCR: NEGATIVE
SARS Coronavirus 2 by RT PCR: NEGATIVE

## 2023-10-31 LAB — ETHANOL: Alcohol, Ethyl (B): <10 mg/dL (ref <10)

## 2023-10-31 LAB — CBG MONITORING, ED: Glucose-Capillary: 121 mg/dL — ABNORMAL HIGH (ref 70–99)

## 2023-10-31 LAB — PROTIME-INR
INR: 1.9 — ABNORMAL HIGH (ref 0.8–1.2)
Prothrombin Time: 22 s — ABNORMAL HIGH (ref 11.4–15.2)

## 2023-10-31 LAB — TROPONIN I (HIGH SENSITIVITY)
Troponin I (High Sensitivity): 14 ng/L (ref <18)
Troponin I (High Sensitivity): 14 ng/L (ref <18)

## 2023-10-31 LAB — APTT: aPTT: 47 s — ABNORMAL HIGH (ref 24–36)

## 2023-10-31 MED ORDER — IOHEXOL 350 MG/ML SOLN
100.0000 mL | Freq: Once | INTRAVENOUS | Status: AC | PRN
  Administered 2023-10-31: 100 mL via INTRAVENOUS

## 2023-10-31 MED ORDER — METOPROLOL SUCCINATE ER 25 MG PO TB24
25.0000 mg | ORAL_TABLET | Freq: Every day | ORAL | Status: DC
  Administered 2023-11-01 – 2023-11-03 (×3): 25 mg via ORAL
  Filled 2023-10-31 (×3): qty 1

## 2023-10-31 MED ORDER — ACETAMINOPHEN 325 MG PO TABS
650.0000 mg | ORAL_TABLET | Freq: Four times a day (QID) | ORAL | Status: DC | PRN
  Administered 2023-11-04: 650 mg via ORAL
  Filled 2023-10-31: qty 2

## 2023-10-31 MED ORDER — LACTATED RINGERS IV BOLUS: 1000.0000 mL | Freq: Once | INTRAVENOUS | Status: DC

## 2023-10-31 MED ORDER — LOSARTAN POTASSIUM 25 MG PO TABS
25.0000 mg | ORAL_TABLET | Freq: Every day | ORAL | Status: DC
  Administered 2023-11-01 – 2023-11-02 (×2): 25 mg via ORAL
  Filled 2023-10-31 (×2): qty 1

## 2023-10-31 MED ORDER — ONDANSETRON HCL 4 MG/2ML IJ SOLN: 4.0000 mg | Freq: Four times a day (QID) | INTRAMUSCULAR | Status: DC | PRN

## 2023-10-31 MED ORDER — ACETAMINOPHEN 650 MG RE SUPP: 650.0000 mg | Freq: Four times a day (QID) | RECTAL | Status: DC | PRN

## 2023-10-31 MED ORDER — ONDANSETRON HCL 4 MG/2ML IJ SOLN
4.0000 mg | Freq: Once | INTRAMUSCULAR | Status: AC
  Administered 2023-10-31: 4 mg via INTRAVENOUS
  Filled 2023-10-31: qty 2

## 2023-10-31 MED ORDER — ALBUTEROL SULFATE (2.5 MG/3ML) 0.083% IN NEBU
2.5000 mg | INHALATION_SOLUTION | RESPIRATORY_TRACT | Status: DC | PRN
Start: 2023-10-31 — End: 2023-11-05

## 2023-10-31 MED ORDER — FUROSEMIDE 10 MG/ML IJ SOLN
40.0000 mg | Freq: Once | INTRAMUSCULAR | Status: AC
  Administered 2023-10-31: 40 mg via INTRAVENOUS
  Filled 2023-10-31: qty 4

## 2023-10-31 MED ORDER — APIXABAN 5 MG PO TABS: 5.0000 mg | ORAL_TABLET | Freq: Two times a day (BID) | ORAL | Status: DC

## 2023-10-31 MED ORDER — SODIUM CHLORIDE 0.9% FLUSH
3.0000 mL | Freq: Once | INTRAVENOUS | Status: AC
  Administered 2023-10-31: 3 mL via INTRAVENOUS

## 2023-10-31 MED ORDER — WARFARIN SODIUM 6 MG PO TABS
6.0000 mg | ORAL_TABLET | Freq: Once | ORAL | Status: AC
  Administered 2023-10-31: 6 mg via ORAL
  Filled 2023-10-31: qty 1

## 2023-10-31 MED ORDER — FUROSEMIDE 10 MG/ML IJ SOLN: 40.0000 mg | Freq: Once | INTRAMUSCULAR | Status: DC

## 2023-10-31 MED ORDER — METOPROLOL SUCCINATE ER 100 MG PO TB24
100.0000 mg | ORAL_TABLET | Freq: Every day | ORAL | Status: DC
  Administered 2023-11-01 – 2023-11-03 (×3): 100 mg via ORAL
  Filled 2023-10-31 (×3): qty 1

## 2023-10-31 MED ORDER — FUROSEMIDE 10 MG/ML IJ SOLN
20.0000 mg | Freq: Two times a day (BID) | INTRAMUSCULAR | Status: DC
Start: 2023-10-31 — End: 2023-11-04
  Administered 2023-11-01 – 2023-11-03 (×6): 20 mg via INTRAVENOUS
  Filled 2023-10-31: qty 4
  Filled 2023-10-31 (×6): qty 2

## 2023-10-31 MED ORDER — WARFARIN - PHARMACIST DOSING INPATIENT
Freq: Every day | Status: DC
  Administered 2023-11-03 – 2023-11-04 (×2): 1
  Filled 2023-10-31: qty 1

## 2023-10-31 MED ORDER — ESCITALOPRAM OXALATE 10 MG PO TABS
5.0000 mg | ORAL_TABLET | Freq: Every day | ORAL | Status: DC
  Administered 2023-10-31 – 2023-11-04 (×4): 5 mg via ORAL
  Filled 2023-10-31 (×4): qty 1

## 2023-10-31 MED ORDER — MORPHINE SULFATE (PF) 4 MG/ML IV SOLN
4.0000 mg | Freq: Once | INTRAVENOUS | Status: AC
  Administered 2023-10-31: 4 mg via INTRAVENOUS
  Filled 2023-10-31: qty 1

## 2023-10-31 MED ORDER — ONDANSETRON HCL 4 MG PO TABS: 4.0000 mg | ORAL_TABLET | Freq: Four times a day (QID) | ORAL | Status: DC | PRN

## 2023-10-31 MED ORDER — GUAIFENESIN ER 600 MG PO TB12
600.0000 mg | ORAL_TABLET | Freq: Two times a day (BID) | ORAL | Status: DC
  Administered 2023-10-31 – 2023-11-05 (×9): 600 mg via ORAL
  Filled 2023-10-31 (×10): qty 1

## 2023-10-31 MED ORDER — HYDROCODONE-ACETAMINOPHEN 5-325 MG PO TABS
1.0000 | ORAL_TABLET | ORAL | Status: DC | PRN
  Administered 2023-11-04: 2 via ORAL
  Administered 2023-11-04: 1 via ORAL
  Administered 2023-11-05: 2 via ORAL
  Filled 2023-10-31 (×2): qty 2
  Filled 2023-10-31: qty 1

## 2023-10-31 MED ORDER — METHIMAZOLE 2.5 MG HALF TABLET
2.5000 mg | ORAL_TABLET | Freq: Every day | ORAL | Status: DC
  Administered 2023-11-01 – 2023-11-05 (×5): 2.5 mg via ORAL
  Filled 2023-10-31 (×5): qty 1

## 2023-10-31 MED ORDER — ATORVASTATIN CALCIUM 20 MG PO TABS: 40.0000 mg | ORAL_TABLET | Freq: Every day | ORAL | Status: DC

## 2023-10-31 MED ORDER — MOMETASONE FURO-FORMOTEROL FUM 100-5 MCG/ACT IN AERO
2.0000 | INHALATION_SPRAY | Freq: Two times a day (BID) | RESPIRATORY_TRACT | Status: DC
  Administered 2023-11-01 – 2023-11-05 (×9): 2 via RESPIRATORY_TRACT
  Filled 2023-10-31: qty 8.8

## 2023-10-31 NOTE — Progress Notes (Addendum)
 PHARMACY - ANTICOAGULATION CONSULT NOTE  Pharmacy Consult for warfarin Indication: atrial fibrillation and DVT  Allergies  Allergen Reactions   Ciprofloxacin Hives   Adhesive [Tape]     Pulls off skin   Nitrofurantoin Other (See Comments)    Can not remember reaction   Phenazopyridine Other (See Comments)    Pt doesn't remember   Ranitidine Other (See Comments)    Can not remember reaction   Sulfa Antibiotics Other (See Comments)    Can not remember reaction    Patient Measurements: Height: 5\' 1"  (154.9 cm) Weight: 63.5 kg (140 lb) IBW/kg (Calculated) : 47.8  Vital Signs: Temp: 97.9 F (36.6 C) (03/02 1504) Temp Source: Oral (03/02 1504) BP: 153/74 (03/02 1900) Pulse Rate: 80 (03/02 1900)  Labs: Recent Labs    10/31/23 1545 10/31/23 1745  HGB 12.2  --   HCT 37.1  --   PLT 342  --   APTT 47*  --   LABPROT 22.0*  --   INR 1.9*  --   CREATININE 0.45  --   TROPONINIHS 14 14    Estimated Creatinine Clearance: 40.7 mL/min (by C-G formula based on SCr of 0.45 mg/dL).   Medical History: Past Medical History:  Diagnosis Date   Anxiety    Arthritis    Asthma    Cancer (HCC)    melanoma skin cancer on head   Diastolic dysfunction    a. 05/2023 Echo: EF 70-76^, no rwma, GrII DD, nl RV fxn, mildly dil RA, mod dil LA. Mild MR. Mod AI.   GERD (gastroesophageal reflux disease)    History of recurrent cystitis    Hyperlipidemia    Hypertension    Infection of prosthetic hip joint (HCC)    Mitral regurgitation    Moderate aortic insufficiency    Nocturia    Obesity    Osteopenia    Persistent atrial fibrillation (HCC)    Thyroid disease    Urinary frequency    Urinary incontinence    Urinary urgency    Wears glasses    Assessment: 89 y/o female presenting with slurred speech. PMH significant for afib and left atrial thrombus (seen on ECHO from 09/2023) on warfarin PTA (recently started in 09/2023), HTN, HLD, and asthma. Pharmacy has been consulted to resume  PTA warfarin.  Warfarin regimen: 2.5 mg on Tue/Thurs/Sat and 5 mg on Mon, Wed, Fri, and Sun (Total weekly dose: 27.5) Last dose: 2.5 mg on 10/30/23 Baseline labs: hgb 12.2, plt 342, INR 1.9  Goal of Therapy:  INR 2.0-3.0 for afib, but targeting 2.5-3.0   (per granddaughter, cardiologist at [facility] is targeting higher end of range due to left atrial thrombus) Monitor platelets by anticoagulation protocol: Yes   Plan:  INR subtherapeutic Give warfarin 6 mg po x1 tonight (20% increase from PTA dose) Monitor daily INR Monitor CBC at least every 72 hours  Thank you for involving pharmacy in this patient's care.   Rockwell Alexandria, PharmD Clinical Pharmacist 10/31/2023 10:00 PM

## 2023-10-31 NOTE — Assessment & Plan Note (Addendum)
 Possible TIA Patient had 3 to 4-week history of speech difficulty that acutely worsening 24 hours.   Patient is at risk for CVA given left atrial thrombus, slightly subtherapeutic INR Head CT was nonacute Neurologic checks, PT TOC eval Will get MRI Continue anticoagulation, not on antiplatelet

## 2023-10-31 NOTE — Assessment & Plan Note (Addendum)
 NICM, EF 40 to 45% on TEE 09/2023 Right pleural effusion/anasarca Patient with shortness of breath, BNP 818 and CTA chest showing right pleural effusion, subcutaneous edema consistent with anasarca IV Lasix Continue home GDMT with metoprolol and losartan Daily weights with intake and output monitoring Cardiology consult for additional recommendations given persistent symptoms

## 2023-10-31 NOTE — ED Triage Notes (Signed)
 Family reports patient is having slurred speech difficulty forming sentences, dropping things and bilateral extremity weakness..  No slurred speech in triage but difficulty concentrating and sitting still.  States "I just dont feel good".  Denies falls.  Patient has afib and on warfarin but can't do cardioversion due to clot in her heart.

## 2023-10-31 NOTE — ED Notes (Signed)
 Pt reconnected to monitor at this time, pt resting in bed with multiple family members at bedside. Pt denies needs. [place] in place

## 2023-10-31 NOTE — Assessment & Plan Note (Signed)
 Generalized weakness Patient with advanced age, multiple comorbidities, recent hospitalizations PT and TOC eval

## 2023-10-31 NOTE — ED Provider Notes (Signed)
 [facility] Provider Note    Event Date/Time   First MD Initiated Contact with Patient 10/31/23 1523     (approximate)   History   Chief Complaint Altered Mental Status and Aphasia   HPI  Andrea Griffin is a 89 y.o. female with past medical history of hypertension, hyperlipidemia, asthma, and atrial fibrillation on Coumadin who presents to the ED for altered mental status.  Daughter reports that patient has had a gradual decline over the past 2 weeks with generalized weakness and difficulty breathing.  After waking up this morning, daughter also noticed that patient's speech seemed to be slurred, prompting her to come to the ED.  She has had difficulty with dropping things with both hands, both of her legs have also been giving out on her recently, but she has not had any falls.  Patient denies any fevers or cough, primarily gets out of breath with exertion and denies any pain in her chest.  They have noticed some mild swelling in her legs but she denies any leg pain.  She has not had any nausea, vomiting, diarrhea, or dysuria.      Physical Exam   Triage Vital Signs: ED Triage Vitals  Encounter Vitals Group     BP 10/31/23 1504 (!) 151/97     Systolic BP Percentile --      Diastolic BP Percentile --      Pulse Rate 10/31/23 1504 75     Resp 10/31/23 1504 (!) 22     Temp 10/31/23 1504 97.9 F (36.6 C)     Temp Source 10/31/23 1504 Oral     SpO2 10/31/23 1504 98 %     Weight 10/31/23 1503 140 lb (63.5 kg)     Height 10/31/23 1503 5\' 1"  (1.549 m)     Head Circumference --      Peak Flow --      Pain Score 10/31/23 1503 0     Pain Loc --      Pain Education --      Exclude from Growth Chart --     Most recent vital signs: Vitals:   10/31/23 1830 10/31/23 1900  BP: (!) 145/92 (!) 153/74  Pulse: 86 80  Resp: 17 15  Temp:    SpO2: 98% 98%    Constitutional: Alert and oriented to person, place, and time but not situation. Eyes: Conjunctivae  are normal. Head: Atraumatic. Nose: No congestion/rhinnorhea. Mouth/Throat: Mucous membranes are moist.  Cardiovascular: Normal rate, irregularly irregular rhythm. Grossly normal heart sounds.  2+ radial pulses bilaterally. Respiratory: Mildly tachypneic with normal respiratory effort.  No retractions. Lungs CTAB. Gastrointestinal: Soft and nontender. No distention. Musculoskeletal: No lower extremity tenderness nor edema.  Neurologic:  Normal speech and language.  Globally weak with no gross focal neurologic deficits appreciated.    ED Results / Procedures / Treatments   Labs (all labs ordered are listed, but only abnormal results are displayed) Labs Reviewed  PROTIME-INR - Abnormal; Notable for the following components:      Result Value   Prothrombin Time 22.0 (*)    INR 1.9 (*)    All other components within normal limits  APTT - Abnormal; Notable for the following components:   aPTT 47 (*)    All other components within normal limits  COMPREHENSIVE METABOLIC PANEL - Abnormal; Notable for the following components:   Sodium 129 (*)    Chloride 94 (*)    Glucose, Bld 102 (*)  Calcium 8.7 (*)    Total Bilirubin 1.5 (*)    All other components within normal limits  URINALYSIS, ROUTINE W REFLEX MICROSCOPIC - Abnormal; Notable for the following components:   Color, Urine YELLOW (*)    APPearance CLEAR (*)    Protein, ur 100 (*)    All other components within normal limits  BLOOD GAS, VENOUS - Abnormal; Notable for the following components:   pCO2, Ven 40 (*)    pO2, Ven 59 (*)    All other components within normal limits  BRAIN NATRIURETIC PEPTIDE - Abnormal; Notable for the following components:   B Natriuretic Peptide 818.6 (*)    All other components within normal limits  CBG MONITORING, ED - Abnormal; Notable for the following components:   Glucose-Capillary 121 (*)    All other components within normal limits  RESP PANEL BY RT-PCR (RSV, FLU A&B, COVID)  [id number]   CBC  DIFFERENTIAL  ETHANOL  CBG MONITORING, ED  TROPONIN I (HIGH SENSITIVITY)  TROPONIN I (HIGH SENSITIVITY)     EKG  ED ECG REPORT I, Chesley Noon, the attending physician, personally viewed and interpreted this ECG.   Date: 10/31/2023  EKG Time: 15:10  Rate: 86  Rhythm: atrial fibrillation  Axis: RAD  Intervals:none  ST&T Change: Nonspecific ST/T abnormality  RADIOLOGY CT head reviewed and interpreted by me with no hemorrhage or midline shift.  PROCEDURES:  Critical Care performed: Yes, see critical care procedure note(s)  .Critical Care  Performed by: Chesley Noon, MD Authorized by: Chesley Noon, MD   Critical care provider statement:    Critical care time (minutes):  30   Critical care time was exclusive of:  Separately billable procedures and treating other patients and teaching time   Critical care was necessary to treat or prevent imminent or life-threatening deterioration of the following conditions:  Respiratory failure   Critical care was time spent personally by me on the following activities:  Development of treatment plan with patient or surrogate, discussions with consultants, evaluation of patient's response to treatment, examination of patient, ordering and review of laboratory studies, ordering and review of radiographic studies, ordering and performing treatments and interventions, pulse oximetry, re-evaluation of patient's condition and review of old charts   I assumed direction of critical care for this patient from another provider in my specialty: no     Care discussed with: admitting provider      MEDICATIONS ORDERED IN ED: Medications  sodium chloride flush (NS) 0.9 % injection 3 mL (3 mLs Intravenous Given 10/31/23 1602)  morphine (PF) 4 MG/ML injection 4 mg (4 mg Intravenous Given 10/31/23 1725)  ondansetron (ZOFRAN) injection 4 mg (4 mg Intravenous Given 10/31/23 1725)  iohexol (OMNIPAQUE) 350 MG/ML injection 100 mL (100 mLs Intravenous  Contrast Given 10/31/23 1748)  furosemide (LASIX) injection 40 mg (40 mg Intravenous Given 10/31/23 1851)     IMPRESSION / MDM / ASSESSMENT AND PLAN / ED COURSE  I reviewed the triage vital signs and the nursing notes.                              89 y.o. female with past medical history of hypertension, hyperlipidemia, asthma, hypothyroidism, and atrial fibrillation on Coumadin who presents to the ED complaining of increasing generalized weakness over the past couple of weeks with difficulty speaking since last night.  Patient's presentation is most consistent with acute presentation with potential threat to life or bodily  function.  Differential diagnosis includes, but is not limited to, stroke, TIA, anemia, electrolyte abnormality, AKI, pneumonia, CHF, ACS, UTI, sepsis.  Patient nontoxic-appearing and in no acute distress, vital signs remarkable for mild tachypnea but otherwise reassuring.  EKG shows atrial fibrillation with controlled rate, nonspecific ST/T wave abnormality noted.  She appears globally weak but has no focal neurologic deficits on exam, last known well time was last night and CT head is negative for acute process.  Labs, chest x-ray, and urinalysis are pending at this time.    Chest x-ray concerning for pulmonary edema, patient dropped oxygen saturations now improved on 3 L nasal cannula. Labs with no significant anemia, leukocytosis, electrolyte abnormality, or AKI.  2 sets of troponin are within normal limits.  On reassessment, patient saying she does not feel good and clutching her chest, but unable to provide additional information.  CTA of chest/abdomen/pelvis performed and negative for acute aortic process, does show pulmonary edema and bilateral pleural effusions.  We will diurese with IV Lasix, case discussed with hospitalist for admission.       FINAL CLINICAL IMPRESSION(S) / ED DIAGNOSES   Final diagnoses:  Altered mental status, unspecified altered mental status  type  Acute respiratory failure with hypoxia (HCC)  Acute congestive heart failure, unspecified heart failure type (HCC)     Rx / DC Orders   ED Discharge Orders     None        Note:  This document was prepared using Dragon voice recognition software and may include unintentional dictation errors.   Chesley Noon, MD 10/31/23 [phone]

## 2023-10-31 NOTE — Assessment & Plan Note (Signed)
 Will get her thyroid function panel Continue methimazole for now

## 2023-10-31 NOTE — ED Notes (Signed)
 Pt to CT

## 2023-10-31 NOTE — Assessment & Plan Note (Addendum)
 Thrombus left atrial appendage Warfarin anticoagulation, subtherapeutic at 1.9 Currently rate controlled with metoprolol Continue Coumadin-pharmacy to manage

## 2023-10-31 NOTE — IPAL (Signed)
  Interdisciplinary Goals of Care Family Meeting   Date carried out: 10/31/2023  Location of the meeting: Bedside  Member's involved: Physician and Family Member or next of kin  Durable Power of Attorney or acting medical decision maker: Patient, granddaughter and husband  Discussion: We discussed goals of care for Andrea Griffin .   I have reviewed medical records including EPIC notes, labs and imaging, assessed the patient and then met with granddaughter and husband at bedside to discuss major active diagnoses, plan of care, natural trajectory, prognosis, GOC, EOL wishes, disposition and options including Full code/DNI/DNR and the concept of comfort care if DNR is elected. Questions and concerns were addressed. They are  in agreement to continue current plan of care . Election for DNR/DNI status.   Code status:   Code Status: Limited: Do not attempt resuscitation (DNR) -DNR-LIMITED -Do Not Intubate/DNI    Disposition: Continue current acute care  Time spent for the meeting: 31    Andris Baumann, MD  10/31/2023, 11:46 PM

## 2023-10-31 NOTE — ED Notes (Signed)
 Pt placed on 3L [place] after desatting to 79 after morphine administration.

## 2023-10-31 NOTE — H&P (Signed)
 History and Physical    Patient: Andrea Griffin ZOX:[id number] DOB: [birth year] DOA: 10/31/2023 DOS: the patient was seen and examined on 10/31/2023 PCP: Marisue Ivan, MD  Patient coming from: Home  Chief Complaint:  Chief Complaint  Patient presents with   Altered Mental Status   Aphasia    HPI: Andrea Griffin is a 89 y.o. female with medical history significant for  HTN, HLD, hyperthyroidism, nonobstructive CAD on cath November 2024, paroxysmal A-fib and left atrial appendage thrombus on Coumadin, cardiomyopathy (EF 40 to 45% on TEE 09/2023) last evaluated by her cardiologist outpatient a couple weeks prior on 10/18/2023 when she had ongoing exertional dyspnea and palpitations and left chest discomfort attributed to symptomatic persistent A-fib for which GDMT was adjusted(losartan added), who was brought to the ED with a complaint of slurred speech on awaking this morning as well as a several week history of weakness, malaise and generally feeling unwell.  Granddaughter at bedside gives most of the history.  She states that her grandmother has been having slurred speech and difficulty getting her words out for the past 3-4 weeks but it became acutely worse in the past 24 hours thus prompting the visit to the ED.   Patient has no cough, fever, nausea vomiting or diarrhea, abdominal pain or dysuria. ED course and data review: Afebrile, BP 151/97, pulse 75 respirations 22 and was initially satting in the high 90s on room air but had a drop in O2 sat to 79% for which she was placed on 3 L. Workup notable for troponin 14 and BNP 818 VBG with pH 7.43 and pCO2 40 CBC normal.  INR near therapeutic at 1.9 Sodium 129 (baseline around 138 but most recently 137) Respiratory viral panel negative for COVID flu and RSV UA unremarkable EKG,Personally viewed and interpreted showing A-fib at 86 with nonspecific ST-T wave changes. CTA chest overall nonacute but with signs of fluid overload as  follows IMPRESSION: 1. Negative for acute aortic dissection or aneurysm. No significant vascular disease within the abdomen or pelvis 2. Small left and moderate right pleural effusions. Cardiomegaly with diffuse hazy pulmonary densities suspected to represent edema 3. Question surface nodularity of the liver as may be seen with cirrhosis, correlate for risk factors 4. No CT evidence for acute intra-abdominal or pelvic abnormality 5. Generalized subcutaneous edema consistent with anasarca 6. Diverticular disease of the sigmoid colon without acute inflammation  CT head nonacute  Patient was treated with Lasix 40 mg Hospitalist consulted for admission.    Past Medical History:  Diagnosis Date   Anxiety    Arthritis    Asthma    Cancer (HCC)    melanoma skin cancer on head   Diastolic dysfunction    a. 05/2023 Echo: EF 70-76^, no rwma, GrII DD, nl RV fxn, mildly dil RA, mod dil LA. Mild MR. Mod AI.   GERD (gastroesophageal reflux disease)    History of recurrent cystitis    Hyperlipidemia    Hypertension    Infection of prosthetic hip joint (HCC)    Mitral regurgitation    Moderate aortic insufficiency    Nocturia    Obesity    Osteopenia    Persistent atrial fibrillation (HCC)    Thyroid disease    Urinary frequency    Urinary incontinence    Urinary urgency    Wears glasses    Past Surgical History:  Procedure Laterality Date   ABDOMINAL HYSTERECTOMY     BACK SURGERY  01/21/2016   lumbar  fusion   bladder stem dilation     BREAST BIOPSY Right    x2   BREAST BIOPSY Right    x2   CARDIOVERSION N/A 08/09/2023   Procedure: CARDIOVERSION;  Surgeon: Kathryne Gin, MD;  Location: [facility];  Service: Cardiovascular;  Laterality: N/A;   CARDIOVERSION N/A 09/20/2023   Procedure: CARDIOVERSION;  Surgeon: Kathryne Gin, MD;  Location: [facility];  Service: Cardiovascular;  Laterality: N/A;   CHOLECYSTECTOMY     conversion of hemiarthroplasty to total hip arthroplasty  Left 03/27/2014   HEMIARTHROPLASTY HIP Left 2010   HEMIARTHROPLASTY HIP Right 2013   Dr. Rosita Kea   INCISION AND DRAINAGE HIP Right 09/15/2012   INCISION AND DRAINAGE HIP Right 2013   Dr. Rosita Kea   LEFT HEART CATH AND CORONARY ANGIOGRAPHY N/A 07/16/2023   Procedure: LEFT HEART CATH AND CORONARY ANGIOGRAPHY;  Surgeon: Iran Ouch, MD;  Location: [facility];  Service: Cardiovascular;  Laterality: N/A;   LUMBAR FUSION  01/21/2016   L4-L5 by Dr. Danielle Dess   MELANOMA EXCISION     skin cancer on top of head   OPEN REDUCTION INTERNAL FIXATION (ORIF) DISTAL RADIAL FRACTURE Right 10/05/2019   Procedure: OPEN REDUCTION INTERNAL FIXATION (ORIF) DISTAL RADIAL FRACTURE;  Surgeon: Kennedy Bucker, MD;  Location: [facility];  Service: Orthopedics;  Laterality: Right;   TEE WITHOUT CARDIOVERSION N/A 08/09/2023   Procedure: TRANSESOPHAGEAL ECHOCARDIOGRAM (TEE);  Surgeon: Alluri, Meryl Dare, MD;  Location: [facility];  Service: Cardiovascular;  Laterality: N/A;   TEE WITHOUT CARDIOVERSION N/A 09/20/2023   Procedure: TRANSESOPHAGEAL ECHOCARDIOGRAM (TEE);  Surgeon: Alluri, Meryl Dare, MD;  Location: [facility];  Service: Cardiovascular;  Laterality: N/A;   Social History:  reports that she has never smoked. She has never used smokeless tobacco. She reports that she does not drink alcohol and does not use drugs.  Allergies  Allergen Reactions   Ciprofloxacin Hives   Adhesive [Tape]     Pulls off skin   Nitrofurantoin Other (See Comments)    Can not remember reaction   Phenazopyridine Other (See Comments)    Pt doesn't remember   Ranitidine Other (See Comments)    Can not remember reaction   Sulfa Antibiotics Other (See Comments)    Can not remember reaction    Family History  Problem Relation Age of Onset   Stroke Mother     Prior to Admission medications   Medication Sig Start Date End Date Taking? Authorizing Provider  acetaminophen (TYLENOL) 500 MG tablet Take 1,000 mg by mouth 2 (two) times daily.     [provider]  amoxicillin (AMOXIL) 500 MG capsule Take 500 mg by mouth 2 (two) times daily.    [provider]  apixaban (ELIQUIS) 5 MG TABS tablet Take 1 tablet (5 mg total) by mouth 2 (two) times daily. 07/17/23   Sunnie Nielsen, DO  atorvastatin (LIPITOR) 40 MG tablet Take 1 tablet (40 mg total) by mouth daily. 07/18/23   Sunnie Nielsen, DO  Calcium Carb-Cholecalciferol (CALCIUM 600+D) 600-800 MG-UNIT TABS Take 2 tablets by mouth in the morning.    [provider]  escitalopram (LEXAPRO) 5 MG tablet Take 5 mg by mouth daily.    [provider]  fexofenadine (ALLEGRA) 180 MG tablet Take 180 mg by mouth daily.    [provider]  fluticasone (FLONASE) 50 MCG/ACT nasal spray Place 2 sprays into both nostrils daily.    [provider]  Fluticasone-Salmeterol (ADVAIR) 100-50 MCG/DOSE AEPB Inhale 1 puff  into the lungs every 12 (twelve) hours.    [provider]  Lidocaine (BLUE-EMU PAIN RELIEF DRY EX) Apply 1 application  topically 2 (two) times daily as needed (Back pain).    [provider]  methimazole (TAPAZOLE) 5 MG tablet Take 2.5 mg by mouth daily. 03/25/23   [provider]  metoprolol succinate (TOPROL-XL) 100 MG 24 hr tablet Take 1 tablet (100 mg total) by mouth 2 (two) times daily. Take with or immediately following a meal. Patient taking differently: Take 100 mg by mouth See admin instructions. 100 mg + 25 mg=125 mg Take with or immediately following a meal. daily 07/17/23   Sunnie Nielsen, DO  metoprolol succinate (TOPROL-XL) 25 MG 24 hr tablet Take 25 mg by mouth See admin instructions. 25 mg + 100 mg=125 mg daily    [provider]  omeprazole (PRILOSEC) 20 MG capsule Take 20 mg by mouth 2 (two) times daily.     [provider]  ondansetron (ZOFRAN-ODT) 4 MG disintegrating tablet Take 1 tablet (4 mg total) by mouth every 8 (eight) hours as needed for nausea or vomiting. 09/30/23    Chesley Noon, MD  senna-docusate (SENOKOT-S) 8.6-50 MG tablet Take 1 tablet by mouth 2 (two) times daily.    [provider]    Physical Exam: Vitals:   10/31/23 1630 10/31/23 1800 10/31/23 1830 10/31/23 1900  BP: (!) 152/132 (!) 157/89 (!) 145/92 (!) 153/74  Pulse: 79 100 86 80  Resp: 19 17 17 15   Temp:      TempSrc:      SpO2:   98% 98%  Weight:      Height:       Physical Exam Vitals and nursing note reviewed.  Constitutional:      General: She is sleeping. She is not in acute distress. HENT:     Head: Normocephalic and atraumatic.  Cardiovascular:     Rate and Rhythm: Normal rate and regular rhythm.     Heart sounds: Normal heart sounds.  Pulmonary:     Effort: Pulmonary effort is normal.     Breath sounds: Normal breath sounds.  Abdominal:     Palpations: Abdomen is soft.     Tenderness: There is no abdominal tenderness.  Musculoskeletal:     Right lower leg: 1+ Edema present.     Left lower leg: 1+ Edema present.  Neurological:     Mental Status: She is easily aroused. Mental status is at baseline.     Labs on Admission: I have personally reviewed following labs and imaging studies  CBC: Recent Labs  Lab 10/31/23 1545  WBC 8.4  NEUTROABS 5.7  HGB 12.2  HCT 37.1  MCV 91.8  PLT 342   Basic Metabolic Panel: Recent Labs  Lab 10/31/23 1545  NA 129*  K 3.7  CL 94*  CO2 26  GLUCOSE 102*  BUN 12  CREATININE 0.45  CALCIUM 8.7*   GFR: Estimated Creatinine Clearance: 40.7 mL/min (by C-G formula based on SCr of 0.45 mg/dL). Liver Function Tests: Recent Labs  Lab 10/31/23 1545  AST 29  ALT 22  ALKPHOS 79  BILITOT 1.5*  PROT 7.2  ALBUMIN 3.6   No results for input(s): "LIPASE", "AMYLASE" in the last 168 hours. No results for input(s): "AMMONIA" in the last 168 hours. Coagulation Profile: Recent Labs  Lab 10/31/23 1545  INR 1.9*   Cardiac Enzymes: No results for input(s): "CKTOTAL", "CKMB", "CKMBINDEX", "TROPONINI" in the  last 168 hours. BNP (  last 3 results) No results for input(s): "PROBNP" in the last 8760 hours. HbA1C: No results for input(s): "HGBA1C" in the last 72 hours. CBG: Recent Labs  Lab 10/31/23 1500  GLUCAP 121*   Lipid Profile: No results for input(s): "CHOL", "HDL", "LDLCALC", "TRIG", "CHOLHDL", "LDLDIRECT" in the last 72 hours. Thyroid Function Tests: No results for input(s): "TSH", "T4TOTAL", "FREET4", "T3FREE", "THYROIDAB" in the last 72 hours. Anemia Panel: No results for input(s): "VITAMINB12", "FOLATE", "FERRITIN", "TIBC", "IRON", "RETICCTPCT" in the last 72 hours. Urine analysis:    Component Value Date/Time   COLORURINE YELLOW (A) 10/31/2023 1545   APPEARANCEUR CLEAR (A) 10/31/2023 1545   APPEARANCEUR Clear 03/13/2014 1403   LABSPEC 1.018 10/31/2023 1545   LABSPEC 1.030 03/13/2014 1403   PHURINE 8.0 10/31/2023 1545   GLUCOSEU NEGATIVE 10/31/2023 1545   GLUCOSEU Negative 03/13/2014 1403   HGBUR NEGATIVE 10/31/2023 1545   BILIRUBINUR NEGATIVE 10/31/2023 1545   BILIRUBINUR Negative 03/13/2014 1403   KETONESUR NEGATIVE 10/31/2023 1545   PROTEINUR 100 (A) 10/31/2023 1545   NITRITE NEGATIVE 10/31/2023 1545   LEUKOCYTESUR NEGATIVE 10/31/2023 1545   LEUKOCYTESUR Negative 03/13/2014 1403    Radiological Exams on Admission: CT Angio Chest/Abd/Pel for Dissection W and/or W/WO Result Date: 10/31/2023 CLINICAL DATA:  Difficulty speaking altered EXAM: CT ANGIOGRAPHY CHEST, ABDOMEN AND PELVIS TECHNIQUE: Non-contrast CT of the chest was initially obtained. Multidetector CT imaging through the chest, abdomen and pelvis was performed using the standard protocol during bolus administration of intravenous contrast. Multiplanar reconstructed images and MIPs were obtained and reviewed to evaluate the vascular anatomy. RADIATION DOSE REDUCTION: This exam was performed according to the departmental dose-optimization program which includes automated exposure control, adjustment of the mA and/or kV  according to patient size and/or use of iterative reconstruction technique. CONTRAST:  OMNIPAQUE IOHEXOL 350 MG/ML SOLN COMPARISON:  Chest x-ray 10/31/2023, CT 07/14/2023, 05/01/2012, chest CT 05/06/2023 FINDINGS: CTA CHEST FINDINGS Cardiovascular: Non contrasted images of the chest demonstrate no acute intramural hematoma. Negative for aortic dissection or aneurysm. Cardiomegaly with marked biatrial enlargement. No significant pericardial effusion. Mediastinum/Nodes: Patent trachea. No suspicious thyroid mass. Subcentimeter mediastinal lymph nodes. Esophagus within normal limits Lungs/Pleura: Subpleural reticulation consistent with pulmonary fibrosis. Small left and moderate right pleural effusions. Mild diffuse hazy lung attenuation likely due to edema. Stable scattered small pulmonary nodules measuring up to 4 mm at the superior left lower lobe on series 7, image 49 for which no additional imaging follow-up is recommended. Musculoskeletal: Sternum is intact.  No acute osseous abnormality Review of the MIP images confirms the above findings. CTA ABDOMEN AND PELVIS FINDINGS VASCULAR Aorta: Normal caliber aorta without aneurysm, dissection, vasculitis or significant stenosis. Celiac: Patent without evidence of aneurysm, dissection, vasculitis or significant stenosis. Left gastric artery arises directly from the aorta, superior to the celiac trunk. SMA: Patent without evidence of aneurysm, dissection, vasculitis or significant stenosis. Renals: Both renal arteries are patent without evidence of aneurysm, dissection, vasculitis, fibromuscular dysplasia or significant stenosis. IMA: Patent without evidence of aneurysm, dissection, vasculitis or significant stenosis. Inflow: Patent without evidence of aneurysm, dissection, vasculitis or significant stenosis. Veins: Suboptimally assessed Review of the MIP images confirms the above findings. NON-VASCULAR Hepatobiliary: Cholecystectomy. No biliary dilatation.  Question mild surface nodularity of the liver Pancreas: Unremarkable. No pancreatic ductal dilatation or surrounding inflammatory changes. Spleen: Normal in size without focal abnormality. Adrenals/Urinary Tract: Adrenal glands are normal. Kidneys show no hydronephrosis. Cyst mid to upper pole left kidney for which no imaging follow-up is recommended. The bladder is  obscured by artifact Stomach/Bowel: Stomach nonenlarged. No dilated small bowel. No acute bowel wall thickening. Diverticular disease of the sigmoid colon. Negative appendix Lymphatic: No suspicious lymph nodes. Reproductive: Largely obscured by artifact.  No obvious adnexal mass Other: Negative for pelvic effusion or free air Musculoskeletal: Generalized subcutaneous edema. Bilateral hip replacements with artifact. Posterior spinal fusion hardware at L4-L5. Sacral Tarlov cysts. Review of the MIP images confirms the above findings. IMPRESSION: 1. Negative for acute aortic dissection or aneurysm. No significant vascular disease within the abdomen or pelvis 2. Small left and moderate right pleural effusions. Cardiomegaly with diffuse hazy pulmonary densities suspected to represent edema 3. Question surface nodularity of the liver as may be seen with cirrhosis, correlate for risk factors 4. No CT evidence for acute intra-abdominal or pelvic abnormality 5. Generalized subcutaneous edema consistent with anasarca 6. Diverticular disease of the sigmoid colon without acute inflammation Electronically Signed   By: Jasmine Pang M.D.   On: 10/31/2023 18:30   DG Chest Portable 1 View Result Date: 10/31/2023 CLINICAL DATA:  Altered mental status EXAM: PORTABLE CHEST 1 VIEW COMPARISON:  10/01/2023 FINDINGS: Cardiomegaly with vascular congestion and mild diffuse interstitial opacity suspicious for edema. Probable small pleural effusions. No pneumothorax IMPRESSION: Cardiomegaly with vascular congestion and mild interstitial edema. Probable small effusions  Electronically Signed   By: Jasmine Pang M.D.   On: 10/31/2023 16:07   CT HEAD WO CONTRAST Result Date: 10/31/2023 CLINICAL DATA:  Neuro deficit.  Evaluate for acute stroke. EXAM: CT HEAD WITHOUT CONTRAST TECHNIQUE: Contiguous axial images were obtained from the base of the skull through the vertex without intravenous contrast. RADIATION DOSE REDUCTION: This exam was performed according to the departmental dose-optimization program which includes automated exposure control, adjustment of the mA and/or kV according to patient size and/or use of iterative reconstruction technique. COMPARISON:  None Available. FINDINGS: Brain: No evidence of acute infarction, hemorrhage, hydrocephalus, extra-axial collection or mass lesion/mass effect. Vascular: No hyperdense vessel or unexpected calcification. Skull: Normal. Negative for fracture or focal lesion. Sinuses/Orbits: There is opacification of the visualized portions of the left maxillary sinus. The remaining paranasal sinuses are clear. Other: None. IMPRESSION: 1. No acute intracranial abnormalities. 2. Left maxillary sinus disease. Electronically Signed   By: Signa Kell M.D.   On: 10/31/2023 15:49     Data Reviewed: Relevant notes from primary care and specialist visits, past discharge summaries as available in EHR, including Care Everywhere. Prior diagnostic testing as pertinent to current admission diagnoses Updated medications and problem lists for reconciliation ED course, including vitals, labs, imaging, treatment and response to treatment Triage notes, nursing and pharmacy notes and ED provider's notes Notable results as noted in HPI   Assessment and Plan: * Acute on chronic HFrEF (heart failure with reduced ejection fraction) (HCC) NICM, EF 40 to 45% on TEE 09/2023 Right pleural effusion/anasarca Patient with shortness of breath, BNP 818 and CTA chest showing right pleural effusion, subcutaneous edema consistent with anasarca IV  Lasix Continue home GDMT with metoprolol and losartan Daily weights with intake and output monitoring Cardiology consult for additional recommendations given persistent symptoms  Slurred speech Possible TIA Patient had 3 to 4-week history of speech difficulty that acutely worsening 24 hours.   Patient is at risk for CVA given left atrial thrombus, slightly subtherapeutic INR Head CT was nonacute Neurologic checks, PT TOC eval Will get MRI Continue anticoagulation, not on antiplatelet  Nonobstructive CAD on [facility] 07/2023 Patient with occasional chest pain.  Troponin normal.  EKG nonacute  Continue metoprolol, losartan, warfarin  Persistent atrial fibrillation (HCC) Thrombus left atrial appendage Warfarin anticoagulation, subtherapeutic at 1.9 Currently rate controlled with metoprolol Continue Coumadin-pharmacy to manage  Hyponatremia Chronic.  Likely hypervolemic Expecting improvement with fluid restriction and diuretics Continue to monitor  Frailty/ physical deconditioning Generalized weakness Patient with advanced age, multiple comorbidities, recent hospitalizations PT and TOC eval  Hyperthyroidism Will get her thyroid function panel Continue methimazole for now  Essential hypertension Continue metoprolol and losartan    DVT prophylaxis: coumadin  Consults: [facility] cardiology  Advance Care Planning:   Code Status: Limited: Do not attempt resuscitation (DNR) -DNR-LIMITED -Do Not Intubate/DNI    Family Communication: Husband and granddaughter at bedside  Disposition Plan: Back to previous home environment  Severity of Illness: The appropriate patient status for this patient is OBSERVATION. Observation status is judged to be reasonable and necessary in order to provide the required intensity of service to ensure the patient's safety. The patient's presenting symptoms, physical exam findings, and initial radiographic and laboratory data in the context of their medical  condition is felt to place them at decreased risk for further clinical deterioration. Furthermore, it is anticipated that the patient will be medically stable for discharge from the hospital within 2 midnights of admission.   Author: Andris Baumann, MD 10/31/2023 9:40 PM  For on call review [email or web address].

## 2023-10-31 NOTE — ED Notes (Addendum)
 First Nurse Note;  Per daughter, pt was "feeling funny" last night and woke up this morning having some difficulty speaking. Daughter was not with patient last night. Pt c/o slurred speech and difficulty speaking. Unknown LKW sometime last night.

## 2023-10-31 NOTE — Assessment & Plan Note (Addendum)
 Patient with occasional chest pain.  Troponin normal.  EKG nonacute Continue metoprolol, losartan, warfarin

## 2023-10-31 NOTE — Assessment & Plan Note (Signed)
 Continue metoprolol and losartan

## 2023-10-31 NOTE — Assessment & Plan Note (Signed)
 Chronic.  Likely hypervolemic Expecting improvement with fluid restriction and diuretics Continue to monitor

## 2023-10-31 NOTE — ED Notes (Signed)
 Pt in MRI.

## 2023-11-01 DIAGNOSIS — I110 Hypertensive heart disease with heart failure: Secondary | ICD-10-CM | POA: Diagnosis present

## 2023-11-01 DIAGNOSIS — R0602 Shortness of breath: Secondary | ICD-10-CM | POA: Diagnosis present

## 2023-11-01 DIAGNOSIS — F419 Anxiety disorder, unspecified: Secondary | ICD-10-CM | POA: Diagnosis present

## 2023-11-01 DIAGNOSIS — E0590 Thyrotoxicosis, unspecified without thyrotoxic crisis or storm: Secondary | ICD-10-CM | POA: Diagnosis present

## 2023-11-01 DIAGNOSIS — Z90710 Acquired absence of both cervix and uterus: Secondary | ICD-10-CM | POA: Diagnosis not present

## 2023-11-01 DIAGNOSIS — I509 Heart failure, unspecified: Secondary | ICD-10-CM

## 2023-11-01 DIAGNOSIS — Z96642 Presence of left artificial hip joint: Secondary | ICD-10-CM | POA: Diagnosis present

## 2023-11-01 DIAGNOSIS — Z1152 Encounter for screening for COVID-19: Secondary | ICD-10-CM | POA: Diagnosis not present

## 2023-11-01 DIAGNOSIS — Z66 Do not resuscitate: Secondary | ICD-10-CM | POA: Diagnosis present

## 2023-11-01 DIAGNOSIS — I428 Other cardiomyopathies: Secondary | ICD-10-CM | POA: Diagnosis present

## 2023-11-01 DIAGNOSIS — Z7901 Long term (current) use of anticoagulants: Secondary | ICD-10-CM | POA: Diagnosis not present

## 2023-11-01 DIAGNOSIS — I513 Intracardiac thrombosis, not elsewhere classified: Secondary | ICD-10-CM | POA: Diagnosis present

## 2023-11-01 DIAGNOSIS — E785 Hyperlipidemia, unspecified: Secondary | ICD-10-CM | POA: Diagnosis present

## 2023-11-01 DIAGNOSIS — I5023 Acute on chronic systolic (congestive) heart failure: Secondary | ICD-10-CM | POA: Diagnosis present

## 2023-11-01 DIAGNOSIS — Z79899 Other long term (current) drug therapy: Secondary | ICD-10-CM | POA: Diagnosis not present

## 2023-11-01 DIAGNOSIS — I4819 Other persistent atrial fibrillation: Secondary | ICD-10-CM | POA: Diagnosis present

## 2023-11-01 DIAGNOSIS — E039 Hypothyroidism, unspecified: Secondary | ICD-10-CM | POA: Diagnosis present

## 2023-11-01 DIAGNOSIS — E871 Hypo-osmolality and hyponatremia: Secondary | ICD-10-CM | POA: Diagnosis present

## 2023-11-01 DIAGNOSIS — J45909 Unspecified asthma, uncomplicated: Secondary | ICD-10-CM | POA: Diagnosis present

## 2023-11-01 DIAGNOSIS — G459 Transient cerebral ischemic attack, unspecified: Secondary | ICD-10-CM | POA: Diagnosis present

## 2023-11-01 DIAGNOSIS — Z823 Family history of stroke: Secondary | ICD-10-CM | POA: Diagnosis not present

## 2023-11-01 DIAGNOSIS — Z85820 Personal history of malignant melanoma of skin: Secondary | ICD-10-CM | POA: Diagnosis not present

## 2023-11-01 DIAGNOSIS — Z7951 Long term (current) use of inhaled steroids: Secondary | ICD-10-CM | POA: Diagnosis not present

## 2023-11-01 DIAGNOSIS — I2510 Atherosclerotic heart disease of native coronary artery without angina pectoris: Secondary | ICD-10-CM | POA: Diagnosis present

## 2023-11-01 DIAGNOSIS — R4701 Aphasia: Secondary | ICD-10-CM | POA: Diagnosis present

## 2023-11-01 DIAGNOSIS — E669 Obesity, unspecified: Secondary | ICD-10-CM | POA: Diagnosis present

## 2023-11-01 LAB — CBC
HCT: 32.9 % — ABNORMAL LOW (ref 36.0–46.0)
Hemoglobin: 10.9 g/dL — ABNORMAL LOW (ref 12.0–15.0)
MCH: 30.4 pg (ref 26.0–34.0)
MCHC: 33.1 g/dL (ref 30.0–36.0)
MCV: 91.9 fL (ref 80.0–100.0)
Platelets: 274 10*3/uL (ref 150–400)
RBC: 3.58 MIL/uL — ABNORMAL LOW (ref 3.87–5.11)
RDW: 14.8 % (ref 11.5–15.5)
WBC: 8.2 10*3/uL (ref 4.0–10.5)
nRBC: 0 % (ref 0.0–0.2)

## 2023-11-01 LAB — PROTIME-INR
INR: 1.9 — ABNORMAL HIGH (ref 0.8–1.2)
Prothrombin Time: 22.2 s — ABNORMAL HIGH (ref 11.4–15.2)

## 2023-11-01 LAB — BASIC METABOLIC PANEL
Anion gap: 9 (ref 5–15)
BUN: 10 mg/dL (ref 8–23)
CO2: 26 mmol/L (ref 22–32)
Calcium: 8.4 mg/dL — ABNORMAL LOW (ref 8.9–10.3)
Chloride: 92 mmol/L — ABNORMAL LOW (ref 98–111)
Creatinine, Ser: 0.56 mg/dL (ref 0.44–1.00)
GFR, Estimated: >60 mL/min (ref >60)
Glucose, Bld: 86 mg/dL (ref 70–99)
Potassium: 3.5 mmol/L (ref 3.5–5.1)
Sodium: 127 mmol/L — ABNORMAL LOW (ref 135–145)

## 2023-11-01 LAB — HEMOGLOBIN A1C
Hgb A1c MFr Bld: 5.3 % (ref 4.8–5.6)
Mean Plasma Glucose: 105.41 mg/dL

## 2023-11-01 LAB — LIPID PANEL
Cholesterol: 85 mg/dL (ref 0–200)
HDL: 31 mg/dL — ABNORMAL LOW (ref >40)
LDL Cholesterol: 41 mg/dL (ref 0–99)
Total CHOL/HDL Ratio: 2.7 ratio
Triglycerides: 67 mg/dL (ref <150)
VLDL: 13 mg/dL (ref 0–40)

## 2023-11-01 MED ORDER — ENOXAPARIN SODIUM 80 MG/0.8ML IJ SOSY
1.0000 mg/kg | PREFILLED_SYRINGE | Freq: Two times a day (BID) | INTRAMUSCULAR | Status: DC
  Administered 2023-11-01 – 2023-11-02 (×4): 62.5 mg via SUBCUTANEOUS
  Filled 2023-11-01 (×2): qty 0.8
  Filled 2023-11-01 (×2): qty 0.63

## 2023-11-01 MED ORDER — WARFARIN SODIUM 7.5 MG PO TABS
7.5000 mg | ORAL_TABLET | Freq: Once | ORAL | Status: AC
  Administered 2023-11-01: 7.5 mg via ORAL
  Filled 2023-11-01: qty 1

## 2023-11-01 NOTE — Progress Notes (Addendum)
 PHARMACY - ANTICOAGULATION CONSULT NOTE  Pharmacy Consult for warfarin and enoxaparin Indication: atrial fibrillation and DVT  Allergies  Allergen Reactions   Ciprofloxacin Hives   Adhesive [Tape]     Pulls off skin   Nitrofurantoin Other (See Comments)    Can not remember reaction   Phenazopyridine Other (See Comments)    Pt doesn't remember   Ranitidine Other (See Comments)    Can not remember reaction   Sulfa Antibiotics Other (See Comments)    Can not remember reaction    Patient Measurements: Height: 5\' 1"  (154.9 cm) Weight: 63.5 kg (140 lb) IBW/kg (Calculated) : 47.8  Vital Signs: Temp: 98.3 F (36.8 C) (03/03 0900) Temp Source: Oral (03/03 0900) BP: 114/54 (03/03 1000) Pulse Rate: 63 (03/03 1000)  Labs: Recent Labs    10/31/23 1545 10/31/23 1745 11/01/23 0451  HGB 12.2  --  10.9*  HCT 37.1  --  32.9*  PLT 342  --  274  APTT 47*  --   --   LABPROT 22.0*  --  22.2*  INR 1.9*  --  1.9*  CREATININE 0.45  --  0.56  TROPONINIHS 14 14  --     Estimated Creatinine Clearance: 40.7 mL/min (by C-G formula based on SCr of 0.56 mg/dL).   Medical History: Past Medical History:  Diagnosis Date   Anxiety    Arthritis    Asthma    Cancer (HCC)    melanoma skin cancer on head   Diastolic dysfunction    a. 05/2023 Echo: EF 70-76^, no rwma, GrII DD, nl RV fxn, mildly dil RA, mod dil LA. Mild MR. Mod AI.   GERD (gastroesophageal reflux disease)    History of recurrent cystitis    Hyperlipidemia    Hypertension    Infection of prosthetic hip joint (HCC)    Mitral regurgitation    Moderate aortic insufficiency    Nocturia    Obesity    Osteopenia    Persistent atrial fibrillation (HCC)    Thyroid disease    Urinary frequency    Urinary incontinence    Urinary urgency    Wears glasses    Assessment: 89 y/o female presenting with slurred speech. PMH significant for afib and left atrial thrombus (seen on ECHO from 09/2023) on warfarin PTA (recently started in  09/2023), HTN, HLD, and asthma. Pharmacy has been consulted to resume PTA warfarin.  Warfarin regimen: 2.5 mg on Tue/Thurs/Sat and 5 mg on Mon, Wed, Fri, and Sun (Total weekly dose: 27.5) Last dose: 2.5 mg on 10/30/23 Baseline labs: hgb 12.2, plt 342, INR 1.9  Goal of Therapy:  INR 2.0-3.0 for afib , targeting 2.5-3.0   (per granddaughter, cardiologist at [facility] is targeting higher end of range due to left atrial thrombus) Monitor platelets by anticoagulation protocol: Yes   Date INR Warfarin Dose Comments  3/2 1.9 6mg  Admitted with slurred speech and AMS (possible TIA)  3/3 1.9  Start enoxaparin 1mg /kg q 12hours until INR >/=2.5   Plan:  INR remains subtherapeutic and unchanged since admission. Will order boosting dose of 7.5mg  tonight at 1600. Enoxaparin started to bridge patient with Afib, left atrial thrombus and possible TIA unitl INR >/= 2.5 Monitor daily INR Monitor CBC at least every 72 hours  Lyndsy Gilberto Rodriguez-Guzman PharmD, BCPS 11/01/2023 10:46 AM

## 2023-11-01 NOTE — Progress Notes (Addendum)
 Progress Note   Patient: Andrea Griffin XLK:[id number] DOB: 11/28/1931 DOA: 10/31/2023     0 DOS: the patient was seen and examined on 11/01/2023   Brief hospital course: Andrea Griffin is a 89 y.o. female with medical history significant for  HTN, HLD, hyperthyroidism, nonobstructive CAD on cath November 2024, paroxysmal A-fib and left atrial appendage thrombus on Coumadin, cardiomyopathy (EF 40 to 45% on TEE 09/2023) last evaluated by her cardiologist outpatient a couple weeks prior on 10/18/2023 when she had ongoing exertional dyspnea and palpitations and left chest discomfort attributed to symptomatic persistent A-fib for which GDMT was adjusted(losartan added), who was brought to the ED with a complaint of slurred speech on awaking this morning as well as a several week history of weakness, malaise and generally feeling unwell.  Also found to have acute decompensation of congestive heart failure requiring admission.  Cardiologist following  Assessment and Plan:   Acute on chronic HFrEF (heart failure with reduced ejection fraction) (HCC) NICM, EF 40 to 45% on TEE 09/2023 Right pleural effusion/anasarca Respiratory viral panel negative for COVID flu and RSV Patient with shortness of breath, BNP 818 and CTA chest showing right pleural effusion, subcutaneous edema consistent with anasarca Continue IV Lasix Monitor input and output as well as daily weight Continue home GDMT with metoprolol and losartan Cardiology on board and case discussed  Acute stroke ruled out Possible TIA MRI of the brain did not show any acute intracranial pathology Patient had 3 to 4-week history of speech difficulty that acutely worsening 24 hours.   Patient is at risk for CVA given left atrial thrombus, slightly subtherapeutic INR Head CT was nonacute Continue PT OT eval Continue neurochecks Follow-up A1c as well as lipid panel   Nonobstructive CAD on [facility] 07/2023 Patient with occasional chest pain.  Troponin  normal.  EKG nonacute Continue metoprolol, losartan, warfarin   Persistent atrial fibrillation (HCC) Thrombus left atrial appendage Warfarin anticoagulation, subtherapeutic at 1.9 Currently rate controlled with metoprolol Pharmacy is currently bridging warfarin with Lovenox until therapeutic   Hyponatremia Chronic.  Likely hypervolemic Expecting improvement with fluid restriction and diuretics Continue management as above   Frailty/ physical deconditioning Generalized weakness Patient with advanced age, multiple comorbidities, recent hospitalizations PT and TOC eval   Hyperthyroidism Will get her thyroid function panel Continue methimazole for now   Essential hypertension Continue metoprolol and losartan    DVT prophylaxis: coumadin   Consults: [facility] cardiology   Advance Care Planning:   Code Status: Limited: Do not attempt resuscitation (DNR) -DNR-LIMITED -Do Not Intubate/DNI     Family Communication: Husband and granddaughter at bedside   Disposition Plan: Back to previous home environment  Subjective:  Patient seen and examined at bedside this morning She tells me her respiratory function is improving Currently on 3 L of intranasal oxygen and does not use any oxygen at home Denies chest pain abdominal pain or cough  Physical Exam:  Vitals and nursing note reviewed.  Constitutional:      General: She is sleeping. She is not in acute distress. HENT:     Head: Normocephalic and atraumatic.  Cardiovascular:     Rate and Rhythm: Normal rate and regular rhythm.     Heart sounds: Normal heart sounds.  Pulmonary: Lower lung base crackles heard Abdominal:     Palpations: Abdomen is soft.     Tenderness: There is no abdominal tenderness.  Musculoskeletal: Mild bilateral lower extremity pitting edema Neurological:     Mental Status: She is  easily aroused. Mental status is at baseline.     Vitals:   11/01/23 1200 11/01/23 1232 11/01/23 1300 11/01/23 1332  BP:  (!)  134/106 (!) 110/57   Pulse: (!) 58 69 71 75  Resp: 16 18 20 18   Temp:   98.9 F (37.2 C)   TempSrc:   Oral   SpO2:   94%   Weight:      Height:        Data Reviewed:    Latest Ref Rng & Units 11/01/2023    4:51 AM 10/31/2023    3:45 PM 09/30/2023    7:13 PM  CBC  WBC 4.0 - 10.5 K/uL 8.2  8.4  9.6   Hemoglobin 12.0 - 15.0 g/dL 16.1  09.6  04.5   Hematocrit 36.0 - 46.0 % 32.9  37.1  40.5   Platelets 150 - 400 K/uL 274  342  301        Latest Ref Rng & Units 11/01/2023    4:51 AM 10/31/2023    3:45 PM 09/30/2023    7:13 PM  BMP  Glucose 70 - 99 mg/dL 86  409  811   BUN 8 - 23 mg/dL 10  12  17    Creatinine 0.44 - 1.00 mg/dL 9.14  7.82  9.56   Sodium 135 - 145 mmol/L 127  129  137   Potassium 3.5 - 5.1 mmol/L 3.5  3.7  3.3   Chloride 98 - 111 mmol/L 92  94  95   CO2 22 - 32 mmol/L 26  26  27    Calcium 8.9 - 10.3 mg/dL 8.4  8.7  8.8     Disposition: Patient currently receiving IV diuresis as well as cardiologist consultation  Author: Loyce Dys, MD 11/01/2023 1:54 PM  For on call review [email or web address].

## 2023-11-01 NOTE — ED Notes (Signed)
 Pt urinated on self while in MRI, pt bed linen changed and pt cleaned of urine at this time. Pt given new gown and warm blankets. Pts family members at bedside. Pt given meal tray and water.

## 2023-11-01 NOTE — Progress Notes (Signed)
 Heart Failure Navigator Progress Note  Assessed for Heart & Vascular TOC clinic readiness.  Patient does not meet criteria due to current [facility] patient of Dr. Burnett Corrente, MD.  Navigator will sign off at this time.  Roxy Horseman, RN, BSN [facility] Heart Failure Navigator Secure Chat Only

## 2023-11-01 NOTE — Consult Note (Signed)
 [facility] CARDIOLOGY CONSULT NOTE       Patient ID: Andrea Griffin MRN: [id number] DOB/AGE: Mar 01, 1932 89 y.o.  Admit date: 10/31/2023 Referring Physician Dr. Lindajo Royal Primary Physician Marisue Ivan, MD  Primary Cardiologist Dr. Corky Sing Reason for Consultation AoCHF  HPI: Andrea Griffin is a 89 y.o. female  with a past medical history of persistent atrial fibrillation, hypertension, hyperlipidemia, hypothyroidism who presented to the ED on 10/31/2023 for shortness of breath.  BNP found to be elevated.  Cardiology was consulted for further evaluation.   Patient seen and examined this morning, granddaughter provides majority of the history as patient is resting comfortably.  Granddaughter reports that few days ago they noticed that the patient was having some slurred speech.  Also report that she has been quite weak lately and has been dropping things when holding them in her hands.  She has also been quite short of breath which has been worsening since her atrial fibrillation diagnosis.  Given her persistently worsening symptoms they decided to bring her into the ED for further evaluation.  Workup in the ED notable for creatinine 0.45, potassium 3.7, sodium 129, hemoglobin 12.2, WBC 8.4.  BNP elevated at 818.  Troponins normal x 2 at 14, 14.  EKG revealed atrial fibrillation with controlled ventricular rate.  Chest x-ray with pulmonary vascular congestion, CTA chest was also consistent.  CT head and MRI brain did not reveal any acute stroke.  She was started on IV Lasix in the ED.  At the time of my evaluation this morning patient is resting comfortably in ED stretcher with husband and granddaughter present at bedside.  She is saturating well on 3 L nasal cannula.  Granddaughter reports that she has had great urine output since arriving to the ED and being started on IV Lasix.  We discussed her symptoms in further detail.  Granddaughter reports that she has been having worsening  fatigue, weakness.  Shortness of breath symptoms have been worsening since she was diagnosed with atrial fibrillation.  She denies any significant issues with swelling.  Patient denies any chest pain or palpitation symptoms.  She is not symptomatic with her atrial fibrillation.  She does not wear supplemental oxygen at home.  Review of systems complete and found to be negative unless listed above    Past Medical History:  Diagnosis Date   Anxiety    Arthritis    Asthma    Cancer (HCC)    melanoma skin cancer on head   Diastolic dysfunction    a. 05/2023 Echo: EF 70-76^, no rwma, GrII DD, nl RV fxn, mildly dil RA, mod dil LA. Mild MR. Mod AI.   GERD (gastroesophageal reflux disease)    History of recurrent cystitis    Hyperlipidemia    Hypertension    Infection of prosthetic hip joint (HCC)    Mitral regurgitation    Moderate aortic insufficiency    Nocturia    Obesity    Osteopenia    Persistent atrial fibrillation (HCC)    Thyroid disease    Urinary frequency    Urinary incontinence    Urinary urgency    Wears glasses     Past Surgical History:  Procedure Laterality Date   ABDOMINAL HYSTERECTOMY     BACK SURGERY  01/21/2016   lumbar fusion   bladder stem dilation     BREAST BIOPSY Right    x2   BREAST BIOPSY Right    x2   CARDIOVERSION N/A 08/09/2023  Procedure: CARDIOVERSION;  Surgeon: Kathryne Gin, MD;  Location: [facility] ORS;  Service: Cardiovascular;  Laterality: N/A;   CARDIOVERSION N/A 09/20/2023   Procedure: CARDIOVERSION;  Surgeon: Kathryne Gin, MD;  Location: [facility] ORS;  Service: Cardiovascular;  Laterality: N/A;   CHOLECYSTECTOMY     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 Rosita Kea   INCISION AND DRAINAGE HIP Right 09/15/2012   INCISION AND DRAINAGE HIP Right 2013   Dr. Rosita Kea   LEFT HEART CATH AND CORONARY ANGIOGRAPHY N/A 07/16/2023   Procedure: LEFT  HEART CATH AND CORONARY ANGIOGRAPHY;  Surgeon: Iran Ouch, MD;  Location: [facility] INVASIVE CV LAB;  Service: Cardiovascular;  Laterality: N/A;   LUMBAR FUSION  01/21/2016   L4-L5 by Dr. Danielle Dess   MELANOMA EXCISION     skin cancer on top of head   OPEN REDUCTION INTERNAL FIXATION (ORIF) DISTAL RADIAL FRACTURE Right 10/05/2019   Procedure: OPEN REDUCTION INTERNAL FIXATION (ORIF) DISTAL RADIAL FRACTURE;  Surgeon: Kennedy Bucker, MD;  Location: [facility] ORS;  Service: Orthopedics;  Laterality: Right;   TEE WITHOUT CARDIOVERSION N/A 08/09/2023   Procedure: TRANSESOPHAGEAL ECHOCARDIOGRAM (TEE);  Surgeon: Alluri, Meryl Dare, MD;  Location: [facility] ORS;  Service: Cardiovascular;  Laterality: N/A;   TEE WITHOUT CARDIOVERSION N/A 09/20/2023   Procedure: TRANSESOPHAGEAL ECHOCARDIOGRAM (TEE);  Surgeon: Alluri, Meryl Dare, MD;  Location: [facility] ORS;  Service: Cardiovascular;  Laterality: N/A;    (Not in a hospital admission)  Social History   Socioeconomic History   Marital status: Married    Spouse name: Not on file   Number of children: Not on file   Years of education: Not on file   Highest education level: Not on file  Occupational History   Not on file  Tobacco Use   Smoking status: Never   Smokeless tobacco: Never  Vaping Use   Vaping status: Never Used  Substance and Sexual Activity   Alcohol use: No   Drug use: No   Sexual activity: Not Currently  Other Topics Concern   Not on file  Social History Narrative   Lives with husband, Earle.    Social Drivers of Corporate investment banker Strain: Low Risk  (07/14/2023)   Received from [facility]   Overall Financial Resource Strain (CARDIA)    Difficulty of Paying Living Expenses: Not hard at all  Food Insecurity: No Food Insecurity (07/15/2023)   Hunger Vital Sign    Worried About Running Out of Food in the Last Year: Never true    Ran Out of Food in the Last Year: Never true  Transportation Needs: No Transportation Needs  (07/15/2023)   PRAPARE - Administrator, Civil Service (Medical): No    Lack of Transportation (Non-Medical): No  Physical Activity: Not on file  Stress: Not on file  Social Connections: Not on file  Intimate Partner Violence: Not At Risk (07/15/2023)   Humiliation, Afraid, Rape, and Kick questionnaire    Fear of Current or Ex-Partner: No    Emotionally Abused: No    Physically Abused: No    Sexually Abused: No    Family History  Problem Relation Age of Onset   Stroke Mother      Vitals:   11/01/23 0800 11/01/23 0830 11/01/23 0900 11/01/23 1000  BP: (!) 147/56 (!) 154/87 114/60 (!) 114/54  Pulse: 82 63 83 63  Resp: 14 14 (!)  22 14  Temp:   98.3 F (36.8 C)   TempSrc:   Oral   SpO2:   98%   Weight:      Height:        PHYSICAL EXAM General: Chronically ill-appearing elderly female, well nourished, in no acute distress. HEENT: Normocephalic and atraumatic. Neck: No JVD.  Lungs: Normal respiratory effort on 3L [place].  Bibasilar crackles Heart: Irregularly irregular, controlled rate. Normal S1 and S2 without gallops or murmurs.  Abdomen: Non-distended appearing.  Msk: Normal strength and tone for age. Extremities: Warm and well perfused. No clubbing, cyanosis.  Trace edema.  Neuro: Alert and oriented X 3. Psych: Answers questions appropriately.   Labs: Basic Metabolic Panel: Recent Labs    10/31/23 1545 11/01/23 0451  NA 129* 127*  K 3.7 3.5  CL 94* 92*  CO2 26 26  GLUCOSE 102* 86  BUN 12 10  CREATININE 0.45 0.56  CALCIUM 8.7* 8.4*   Liver Function Tests: Recent Labs    10/31/23 1545  AST 29  ALT 22  ALKPHOS 79  BILITOT 1.5*  PROT 7.2  ALBUMIN 3.6   No results for input(s): "LIPASE", "AMYLASE" in the last 72 hours. CBC: Recent Labs    10/31/23 1545 11/01/23 0451  WBC 8.4 8.2  NEUTROABS 5.7  --   HGB 12.2 10.9*  HCT 37.1 32.9*  MCV 91.8 91.9  PLT 342 274   Cardiac Enzymes: Recent Labs    10/31/23 1545 10/31/23 1745   TROPONINIHS 14 14   BNP: Recent Labs    10/31/23 1545  BNP 818.6*   D-Dimer: No results for input(s): "DDIMER" in the last 72 hours. Hemoglobin A1C: No results for input(s): "HGBA1C" in the last 72 hours. Fasting Lipid Panel: No results for input(s): "CHOL", "HDL", "LDLCALC", "TRIG", "CHOLHDL", "LDLDIRECT" in the last 72 hours. Thyroid Function Tests: No results for input(s): "TSH", "T4TOTAL", "T3FREE", "THYROIDAB" in the last 72 hours.  Invalid input(s): "FREET3" Anemia Panel: No results for input(s): "VITAMINB12", "FOLATE", "FERRITIN", "TIBC", "IRON", "RETICCTPCT" in the last 72 hours.   Radiology: MR BRAIN WO CONTRAST Result Date: 11/01/2023 CLINICAL DATA:  Initial evaluation for acute neuro deficit, stroke suspected. EXAM: MRI HEAD WITHOUT CONTRAST TECHNIQUE: Multiplanar, multiecho pulse sequences of the brain and surrounding structures were obtained without intravenous contrast. COMPARISON:  Prior CT from earlier the same day. FINDINGS: Brain: Cerebral volume within normal limits. Patchy T2/FLAIR hyperintensity involving the periventricular white matter, most characteristic of chronic microvascular ischemic disease, mild for age. No evidence for acute or subacute ischemia. No areas of chronic cortical infarction. No acute or chronic intracranial blood products. No mass lesion, midline shift or mass effect. No hydrocephalus or extra-axial fluid collection. Pituitary gland and suprasellar region within normal limits. Vascular: Major intracranial vascular flow voids are maintained. Skull and upper cervical spine: Craniocervical junction within normal limits. Bone marrow signal intensity normal. No scalp soft tissue abnormality. Sinuses/Orbits: Prior bilateral ocular lens replacement. Chronic left maxillary sinusitis noted. Paranasal sinuses are otherwise largely clear. Trace bilateral mastoid effusions noted, of doubtful significance. Other: None. IMPRESSION: 1. No acute intracranial  abnormality. 2. Mild chronic microvascular ischemic disease for age. 3. Chronic left maxillary sinusitis. Electronically Signed   By: Rise Mu M.D.   On: 11/01/2023 00:04   CT Angio Chest/Abd/Pel for Dissection W and/or W/WO Result Date: 10/31/2023 CLINICAL DATA:  Difficulty speaking altered EXAM: CT ANGIOGRAPHY CHEST, ABDOMEN AND PELVIS TECHNIQUE: Non-contrast CT of the chest was initially obtained. Multidetector CT imaging through the  chest, abdomen and pelvis was performed using the standard protocol during bolus administration of intravenous contrast. Multiplanar reconstructed images and MIPs were obtained and reviewed to evaluate the vascular anatomy. RADIATION DOSE REDUCTION: This exam was performed according to the departmental dose-optimization program which includes automated exposure control, adjustment of the mA and/or kV according to patient size and/or use of iterative reconstruction technique. CONTRAST:  OMNIPAQUE IOHEXOL 350 MG/ML SOLN COMPARISON:  Chest x-ray 10/31/2023, CT 07/14/2023, 05/01/2012, chest CT 05/06/2023 FINDINGS: CTA CHEST FINDINGS Cardiovascular: Non contrasted images of the chest demonstrate no acute intramural hematoma. Negative for aortic dissection or aneurysm. Cardiomegaly with marked biatrial enlargement. No significant pericardial effusion. Mediastinum/Nodes: Patent trachea. No suspicious thyroid mass. Subcentimeter mediastinal lymph nodes. Esophagus within normal limits Lungs/Pleura: Subpleural reticulation consistent with pulmonary fibrosis. Small left and moderate right pleural effusions. Mild diffuse hazy lung attenuation likely due to edema. Stable scattered small pulmonary nodules measuring up to 4 mm at the superior left lower lobe on series 7, image 49 for which no additional imaging follow-up is recommended. Musculoskeletal: Sternum is intact.  No acute osseous abnormality Review of the MIP images confirms the above findings. CTA ABDOMEN AND PELVIS  FINDINGS VASCULAR Aorta: Normal caliber aorta without aneurysm, dissection, vasculitis or significant stenosis. Celiac: Patent without evidence of aneurysm, dissection, vasculitis or significant stenosis. Left gastric artery arises directly from the aorta, superior to the celiac trunk. SMA: Patent without evidence of aneurysm, dissection, vasculitis or significant stenosis. Renals: Both renal arteries are patent without evidence of aneurysm, dissection, vasculitis, fibromuscular dysplasia or significant stenosis. IMA: Patent without evidence of aneurysm, dissection, vasculitis or significant stenosis. Inflow: Patent without evidence of aneurysm, dissection, vasculitis or significant stenosis. Veins: Suboptimally assessed Review of the MIP images confirms the above findings. NON-VASCULAR Hepatobiliary: Cholecystectomy. No biliary dilatation. Question mild surface nodularity of the liver Pancreas: Unremarkable. No pancreatic ductal dilatation or surrounding inflammatory changes. Spleen: Normal in size without focal abnormality. Adrenals/Urinary Tract: Adrenal glands are normal. Kidneys show no hydronephrosis. Cyst mid to upper pole left kidney for which no imaging follow-up is recommended. The bladder is obscured by artifact Stomach/Bowel: Stomach nonenlarged. No dilated small bowel. No acute bowel wall thickening. Diverticular disease of the sigmoid colon. Negative appendix Lymphatic: No suspicious lymph nodes. Reproductive: Largely obscured by artifact.  No obvious adnexal mass Other: Negative for pelvic effusion or free air Musculoskeletal: Generalized subcutaneous edema. Bilateral hip replacements with artifact. Posterior spinal fusion hardware at L4-L5. Sacral Tarlov cysts. Review of the MIP images confirms the above findings. IMPRESSION: 1. Negative for acute aortic dissection or aneurysm. No significant vascular disease within the abdomen or pelvis 2. Small left and moderate right pleural effusions.  Cardiomegaly with diffuse hazy pulmonary densities suspected to represent edema 3. Question surface nodularity of the liver as may be seen with cirrhosis, correlate for risk factors 4. No CT evidence for acute intra-abdominal or pelvic abnormality 5. Generalized subcutaneous edema consistent with anasarca 6. Diverticular disease of the sigmoid colon without acute inflammation Electronically Signed   By: Jasmine Pang M.D.   On: 10/31/2023 18:30   DG Chest Portable 1 View Result Date: 10/31/2023 CLINICAL DATA:  Altered mental status EXAM: PORTABLE CHEST 1 VIEW COMPARISON:  10/01/2023 FINDINGS: Cardiomegaly with vascular congestion and mild diffuse interstitial opacity suspicious for edema. Probable small pleural effusions. No pneumothorax IMPRESSION: Cardiomegaly with vascular congestion and mild interstitial edema. Probable small effusions Electronically Signed   By: Jasmine Pang M.D.   On: 10/31/2023 16:07   CT  HEAD WO CONTRAST Result Date: 10/31/2023 CLINICAL DATA:  Neuro deficit.  Evaluate for acute stroke. EXAM: CT HEAD WITHOUT CONTRAST TECHNIQUE: Contiguous axial images were obtained from the base of the skull through the vertex without intravenous contrast. RADIATION DOSE REDUCTION: This exam was performed according to the departmental dose-optimization program which includes automated exposure control, adjustment of the mA and/or kV according to patient size and/or use of iterative reconstruction technique. COMPARISON:  None Available. FINDINGS: Brain: No evidence of acute infarction, hemorrhage, hydrocephalus, extra-axial collection or mass lesion/mass effect. Vascular: No hyperdense vessel or unexpected calcification. Skull: Normal. Negative for fracture or focal lesion. Sinuses/Orbits: There is opacification of the visualized portions of the left maxillary sinus. The remaining paranasal sinuses are clear. Other: None. IMPRESSION: 1. No acute intracranial abnormalities. 2. Left maxillary sinus disease.  Electronically Signed   By: Signa Kell M.D.   On: 10/31/2023 15:49    ECHO TEE 09/20/2023: 1. Left ventricular ejection fraction, by estimation, is 40 to 45%. The  left ventricle has mild to moderately decreased function.   2. Right ventricular systolic function is moderately reduced. The right  ventricular size is normal.   3. Left atrial size was dilated. A left atrial/left atrial appendage  thrombus was detected.   4. Right atrial size was dilated.   5. The mitral valve was not assessed.   6. The aortic valve was not assessed.   TELEMETRY reviewed by me 11/01/2023: Atrial fibrillation rate 70s  EKG reviewed by me: Atrial fibrillation rate 86 bpm  Data reviewed by me 11/01/2023: last 24h vitals tele labs imaging I/O ED provider note, admission H&P  Principal Problem:   Acute on chronic HFrEF (heart failure with reduced ejection fraction) (HCC) Active Problems:   Hyponatremia   Essential hypertension   Hyperthyroidism   NICM , EF 40-45% 09/2023(nonischemic cardiomyopathy) (HCC)   Persistent atrial fibrillation (HCC)   Warfarin anticoagulation   Thrombus of left atrial appendage   Frailty/ physical deconditioning   Generalized weakness   Nonobstructive CAD on [facility] 07/2023   Slurred speech    ASSESSMENT AND PLAN:  ROCKELLE HEUERMAN is a 89 y.o. female  with a past medical history of persistent atrial fibrillation, hypertension, hyperlipidemia, hypothyroidism who presented to the ED on 10/31/2023 for shortness of breath.  BNP found to be elevated.  Cardiology was consulted for further evaluation.   # Acute on chronic HFrEF # Persistent atrial fibrillation Patient presented to the ED with complaints of worsening shortness of breath.  BNP elevated at 818.  Troponins normal x 2.  Granddaughter also reports that she was having slurred speech, CT and MRI negative for acute stroke.  Recently was diagnosed with atrial fibrillation and cardioversion was attempted twice however these were both  deferred as she has left atrial appendage thrombus. -Continue IV Lasix 20 mg twice daily.  She was not taking any p.o. diuretic prior to her admission. -Continue metoprolol succinate 125 mg daily, losartan 25 mg daily.  Can consider addition of spironolactone pending BP and renal function.  Patient would not be a good candidate for SGLT2 inhibitor as she has frequent UTIs. -Continue warfarin as per pharmacy. -No plan for further cardiac diagnostics at this time.   This patient's plan of care was discussed and created with Dr. Juliann Pares and he is in agreement.  Signed: Gale Journey, PA-C  11/01/2023, 11:08 AM [facility]

## 2023-11-02 DIAGNOSIS — I5023 Acute on chronic systolic (congestive) heart failure: Secondary | ICD-10-CM | POA: Diagnosis not present

## 2023-11-02 LAB — PROTIME-INR
INR: 2.4 — ABNORMAL HIGH (ref 0.8–1.2)
Prothrombin Time: 26.3 s — ABNORMAL HIGH (ref 11.4–15.2)

## 2023-11-02 LAB — CBC
HCT: 34.5 % — ABNORMAL LOW (ref 36.0–46.0)
Hemoglobin: 11.5 g/dL — ABNORMAL LOW (ref 12.0–15.0)
MCH: 29.9 pg (ref 26.0–34.0)
MCHC: 33.3 g/dL (ref 30.0–36.0)
MCV: 89.8 fL (ref 80.0–100.0)
Platelets: 298 K/uL (ref 150–400)
RBC: 3.84 MIL/uL — ABNORMAL LOW (ref 3.87–5.11)
RDW: 14.6 % (ref 11.5–15.5)
WBC: 7.7 K/uL (ref 4.0–10.5)
nRBC: 0 % (ref 0.0–0.2)

## 2023-11-02 LAB — BASIC METABOLIC PANEL
Anion gap: 10 (ref 5–15)
BUN: 10 mg/dL (ref 8–23)
CO2: 29 mmol/L (ref 22–32)
Calcium: 8.6 mg/dL — ABNORMAL LOW (ref 8.9–10.3)
Chloride: 90 mmol/L — ABNORMAL LOW (ref 98–111)
Creatinine, Ser: 0.39 mg/dL — ABNORMAL LOW (ref 0.44–1.00)
GFR, Estimated: >60 mL/min (ref >60)
Glucose, Bld: 78 mg/dL (ref 70–99)
Potassium: 3.2 mmol/L — ABNORMAL LOW (ref 3.5–5.1)
Sodium: 129 mmol/L — ABNORMAL LOW (ref 135–145)

## 2023-11-02 MED ORDER — WARFARIN SODIUM 2.5 MG PO TABS
2.5000 mg | ORAL_TABLET | Freq: Once | ORAL | Status: AC
  Administered 2023-11-02: 2.5 mg via ORAL
  Filled 2023-11-02: qty 1

## 2023-11-02 MED ORDER — SPIRONOLACTONE 12.5 MG HALF TABLET
12.5000 mg | ORAL_TABLET | Freq: Every day | ORAL | Status: DC
  Administered 2023-11-02 – 2023-11-03 (×2): 12.5 mg via ORAL
  Filled 2023-11-02 (×3): qty 1

## 2023-11-02 MED ORDER — POTASSIUM CHLORIDE CRYS ER 20 MEQ PO TBCR
40.0000 meq | EXTENDED_RELEASE_TABLET | ORAL | Status: AC
Start: 2023-11-02 — End: 2023-11-02
  Administered 2023-11-02 (×2): 40 meq via ORAL
  Filled 2023-11-02 (×2): qty 2

## 2023-11-02 NOTE — Plan of Care (Signed)

## 2023-11-02 NOTE — Plan of Care (Signed)

## 2023-11-02 NOTE — Progress Notes (Signed)
 [name] CLINIC CARDIOLOGY PROGRESS NOTE       Patient ID: Andrea Griffin MRN: [id number] DOB/AGE: [birth year] 89 y.o.  Admit date: 10/31/2023 Referring Physician Dr. Lindajo Royal Primary Physician Andrea Ivan, MD  Primary Cardiologist Dr. Corky Sing Reason for Consultation AoCHF  HPI: Andrea Griffin is a 89 y.o. female  with a past medical history of persistent atrial fibrillation, hypertension, hyperlipidemia, hypothyroidism who presented to the ED on 10/31/2023 for shortness of breath.  BNP found to be elevated.  Cardiology was consulted for further evaluation.   Interval history: -Patient seen and examined this morning, resting comfortably in hospital bed. -Has diuresed well over the last 24 hours, renal function stable.  Weaned to 2 L nasal cannula this morning. -Feels her shortness of breath is about the same.  Denies any chest pain symptoms. -BP and heart rate remained stable.  Review of systems complete and found to be negative unless listed above    Past Medical History:  Diagnosis Date   Anxiety    Arthritis    Asthma    Cancer (HCC)    melanoma skin cancer on head   Diastolic dysfunction    a. 05/2023 Echo: EF 70-76^, no rwma, GrII DD, nl RV fxn, mildly dil RA, mod dil LA. Mild MR. Mod AI.   GERD (gastroesophageal reflux disease)    History of recurrent cystitis    Hyperlipidemia    Hypertension    Infection of prosthetic hip joint (HCC)    Mitral regurgitation    Moderate aortic insufficiency    Nocturia    Obesity    Osteopenia    Persistent atrial fibrillation (HCC)    Thyroid disease    Urinary frequency    Urinary incontinence    Urinary urgency    Wears glasses     Past Surgical History:  Procedure Laterality Date   ABDOMINAL HYSTERECTOMY     BACK SURGERY  01/21/2016   lumbar fusion   bladder stem dilation     BREAST BIOPSY Right    x2   BREAST BIOPSY Right    x2   CARDIOVERSION N/A 08/09/2023   Procedure: CARDIOVERSION;  Surgeon: Kathryne Gin, MD;  Location: [facility] ORS;  Service: Cardiovascular;  Laterality: N/A;   CARDIOVERSION N/A 09/20/2023   Procedure: CARDIOVERSION;  Surgeon: Kathryne Gin, MD;  Location: [facility] ORS;  Service: Cardiovascular;  Laterality: N/A;   CHOLECYSTECTOMY     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 Rosita Kea   INCISION AND DRAINAGE HIP Right 09/15/2012   INCISION AND DRAINAGE HIP Right 2013   Dr. Rosita Kea   LEFT HEART CATH AND CORONARY ANGIOGRAPHY N/A 07/16/2023   Procedure: LEFT HEART CATH AND CORONARY ANGIOGRAPHY;  Surgeon: Iran Ouch, MD;  Location: [facility] INVASIVE CV LAB;  Service: Cardiovascular;  Laterality: N/A;   LUMBAR FUSION  01/21/2016   L4-L5 by Dr. Danielle Dess   MELANOMA EXCISION     skin cancer on top of head   OPEN REDUCTION INTERNAL FIXATION (ORIF) DISTAL RADIAL FRACTURE Right 10/05/2019   Procedure: OPEN REDUCTION INTERNAL FIXATION (ORIF) DISTAL RADIAL FRACTURE;  Surgeon: Kennedy Bucker, MD;  Location: [facility] ORS;  Service: Orthopedics;  Laterality: Right;   TEE WITHOUT CARDIOVERSION N/A 08/09/2023   Procedure: TRANSESOPHAGEAL ECHOCARDIOGRAM (TEE);  Surgeon: Alluri, Meryl Dare, MD;  Location: [facility] ORS;  Service: Cardiovascular;  Laterality: N/A;   TEE WITHOUT CARDIOVERSION N/A 09/20/2023  Procedure: TRANSESOPHAGEAL ECHOCARDIOGRAM (TEE);  Surgeon: Kathryne Gin, MD;  Location: [facility] ORS;  Service: Cardiovascular;  Laterality: N/A;    Medications Prior to Admission  Medication Sig Dispense Refill Last Dose/Taking   acetaminophen (TYLENOL) 500 MG tablet Take 1,000 mg by mouth 2 (two) times daily.   10/31/2023 Morning   amoxicillin (AMOXIL) 500 MG capsule Take 500 mg by mouth 2 (two) times daily.   10/31/2023 Morning   atorvastatin (LIPITOR) 40 MG tablet Take 1 tablet (40 mg total) by mouth daily. 30 tablet 0 10/31/2023 Morning   Calcium Carb-Cholecalciferol (CALCIUM 600+D) 600-800 MG-UNIT  TABS Take 2 tablets by mouth in the morning.   10/31/2023 Morning   escitalopram (LEXAPRO) 5 MG tablet Take 5 mg by mouth daily.   10/30/2023 at  8:30 PM   fexofenadine (ALLEGRA) 180 MG tablet Take 180 mg by mouth daily.   10/31/2023 Morning   fluticasone (FLONASE) 50 MCG/ACT nasal spray Place 2 sprays into both nostrils daily.   10/30/2023 Bedtime   Fluticasone-Salmeterol (ADVAIR) 100-50 MCG/DOSE AEPB Inhale 1 puff into the lungs every 12 (twelve) hours.   10/31/2023 Morning   Lidocaine (BLUE-EMU PAIN RELIEF DRY EX) Apply 1 application  topically 2 (two) times daily as needed (Back pain).   10/30/2023 Evening   losartan (COZAAR) 25 MG tablet Take 1 tablet by mouth daily.   10/31/2023 Morning   methimazole (TAPAZOLE) 5 MG tablet Take 2.5 mg by mouth daily.   10/31/2023 Morning   metoprolol succinate (TOPROL-XL) 100 MG 24 hr tablet Take 1 tablet (100 mg total) by mouth 2 (two) times daily. Take with or immediately following a meal. (Patient taking differently: Take 100 mg by mouth daily. 100 mg + 25 mg=125 mg Take with or immediately following a meal. daily) 60 tablet 0 10/30/2023 Noon   metoprolol succinate (TOPROL-XL) 25 MG 24 hr tablet Take 25 mg by mouth See admin instructions. 25 mg + 100 mg=125 mg daily   10/30/2023 Noon   omeprazole (PRILOSEC) 20 MG capsule Take 20 mg by mouth 2 (two) times daily.    10/31/2023 Morning   senna-docusate (SENOKOT-S) 8.6-50 MG tablet Take 1 tablet by mouth daily as needed for mild constipation.   10/31/2023 Morning   warfarin (COUMADIN) 2.5 MG tablet Take 2.5-5 mg by mouth every evening. Take 2 tablets on Monday, Wednesday, Friday and Sunday. Take 1 tablet on Tuesday, Thursday and Saturday. For INR between 2.5 and 3.   10/30/2023 at  8:30 PM   apixaban (ELIQUIS) 5 MG TABS tablet Take 1 tablet (5 mg total) by mouth 2 (two) times daily. (Patient not taking: Reported on 10/31/2023) 60 tablet 0 Not Taking   ondansetron (ZOFRAN-ODT) 4 MG disintegrating tablet Take 1 tablet (4 mg total) by mouth  every 8 (eight) hours as needed for nausea or vomiting. (Patient not taking: Reported on 10/31/2023) 12 tablet 0 Not Taking   Social History   Socioeconomic History   Marital status: Married    Spouse name: Not on file   Number of children: Not on file   Years of education: Not on file   Highest education level: Not on file  Occupational History   Not on file  Tobacco Use   Smoking status: Never   Smokeless tobacco: Never  Vaping Use   Vaping status: Never Used  Substance and Sexual Activity   Alcohol use: No   Drug use: No   Sexual activity: Not Currently  Other Topics Concern   Not on file  Social History Narrative   Lives with husband, Earle.    Social Drivers of Corporate investment banker Strain: Low Risk  (07/14/2023)   Received from [facility]   Overall Financial Resource Strain (CARDIA)    Difficulty of Paying Living Expenses: Not hard at all  Food Insecurity: No Food Insecurity (11/02/2023)   Hunger Vital Sign    Worried About Running Out of Food in the Last Year: Never true    Ran Out of Food in the Last Year: Never true  Transportation Needs: No Transportation Needs (11/02/2023)   PRAPARE - Administrator, Civil Service (Medical): No    Lack of Transportation (Non-Medical): No  Physical Activity: Not on file  Stress: Not on file  Social Connections: Socially Integrated (11/02/2023)   Social Connection and Isolation Panel [NHANES]    Frequency of Communication with Friends and Family: Twice a week    Frequency of Social Gatherings with Friends and Family: More than three times a week    Attends Religious Services: 1 to 4 times per year    Active Member of Clubs or Organizations: No    Attends Banker Meetings: 1 to 4 times per year    Marital Status: Married  Catering manager Violence: Not At Risk (11/02/2023)   Humiliation, Afraid, Rape, and Kick questionnaire    Fear of Current or Ex-Partner: No    Emotionally Abused:  No    Physically Abused: No    Sexually Abused: No    Family History  Problem Relation Age of Onset   Stroke Mother      Vitals:   11/02/23 0401 11/02/23 0500 11/02/23 0746 11/02/23 0800  BP: 137/63  139/61 139/61  Pulse: 71  78 78  Resp: 18     Temp: (!) 97.5 F (36.4 C)  98.2 F (36.8 C)   TempSrc: Oral  Oral   SpO2: 100%  99%   Weight:  63.1 kg    Height:        PHYSICAL EXAM General: Chronically ill-appearing elderly female, well nourished, in no acute distress. HEENT: Normocephalic and atraumatic. Neck: No JVD.  Lungs: Normal respiratory effort on 2L [place].  Bibasilar crackles Heart: Irregularly irregular, controlled rate. Normal S1 and S2 without gallops or murmurs.  Abdomen: Non-distended appearing.  Msk: Normal strength and tone for age. Extremities: Warm and well perfused. No clubbing, cyanosis.  Trace edema.  Neuro: Alert and oriented X 3. Psych: Answers questions appropriately.   Labs: Basic Metabolic Panel: Recent Labs    11/01/23 0451 11/02/23 0423  NA 127* 129*  K 3.5 3.2*  CL 92* 90*  CO2 26 29  GLUCOSE 86 78  BUN 10 10  CREATININE 0.56 0.39*  CALCIUM 8.4* 8.6*   Liver Function Tests: Recent Labs    10/31/23 1545  AST 29  ALT 22  ALKPHOS 79  BILITOT 1.5*  PROT 7.2  ALBUMIN 3.6   No results for input(s): "LIPASE", "AMYLASE" in the last 72 hours. CBC: Recent Labs    10/31/23 1545 11/01/23 0451 11/02/23 0423  WBC 8.4 8.2 7.7  NEUTROABS 5.7  --   --   HGB 12.2 10.9* 11.5*  HCT 37.1 32.9* 34.5*  MCV 91.8 91.9 89.8  PLT 342 274 298   Cardiac Enzymes: Recent Labs    10/31/23 1545 10/31/23 1745  TROPONINIHS 14 14   BNP: Recent Labs    10/31/23 1545  BNP 818.6*   D-Dimer: No results  for input(s): "DDIMER" in the last 72 hours. Hemoglobin A1C: Recent Labs    11/01/23 0451  HGBA1C 5.3   Fasting Lipid Panel: Recent Labs    11/01/23 0451  CHOL 85  HDL 31*  LDLCALC 41  TRIG 67  CHOLHDL 2.7   Thyroid Function  Tests: No results for input(s): "TSH", "T4TOTAL", "T3FREE", "THYROIDAB" in the last 72 hours.  Invalid input(s): "FREET3" Anemia Panel: No results for input(s): "VITAMINB12", "FOLATE", "FERRITIN", "TIBC", "IRON", "RETICCTPCT" in the last 72 hours.   Radiology: MR BRAIN WO CONTRAST Result Date: 11/01/2023 CLINICAL DATA:  Initial evaluation for acute neuro deficit, stroke suspected. EXAM: MRI HEAD WITHOUT CONTRAST TECHNIQUE: Multiplanar, multiecho pulse sequences of the brain and surrounding structures were obtained without intravenous contrast. COMPARISON:  Prior CT from earlier the same day. FINDINGS: Brain: Cerebral volume within normal limits. Patchy T2/FLAIR hyperintensity involving the periventricular white matter, most characteristic of chronic microvascular ischemic disease, mild for age. No evidence for acute or subacute ischemia. No areas of chronic cortical infarction. No acute or chronic intracranial blood products. No mass lesion, midline shift or mass effect. No hydrocephalus or extra-axial fluid collection. Pituitary gland and suprasellar region within normal limits. Vascular: Major intracranial vascular flow voids are maintained. Skull and upper cervical spine: Craniocervical junction within normal limits. Bone marrow signal intensity normal. No scalp soft tissue abnormality. Sinuses/Orbits: Prior bilateral ocular lens replacement. Chronic left maxillary sinusitis noted. Paranasal sinuses are otherwise largely clear. Trace bilateral mastoid effusions noted, of doubtful significance. Other: None. IMPRESSION: 1. No acute intracranial abnormality. 2. Mild chronic microvascular ischemic disease for age. 3. Chronic left maxillary sinusitis. Electronically Signed   By: Rise Mu M.D.   On: 11/01/2023 00:04   CT Angio Chest/Abd/Pel for Dissection W and/or W/WO Result Date: 10/31/2023 CLINICAL DATA:  Difficulty speaking altered EXAM: CT ANGIOGRAPHY CHEST, ABDOMEN AND PELVIS TECHNIQUE:  Non-contrast CT of the chest was initially obtained. Multidetector CT imaging through the chest, abdomen and pelvis was performed using the standard protocol during bolus administration of intravenous contrast. Multiplanar reconstructed images and MIPs were obtained and reviewed to evaluate the vascular anatomy. RADIATION DOSE REDUCTION: This exam was performed according to the departmental dose-optimization program which includes automated exposure control, adjustment of the mA and/or kV according to patient size and/or use of iterative reconstruction technique. CONTRAST:  OMNIPAQUE IOHEXOL 350 MG/ML SOLN COMPARISON:  Chest x-ray 10/31/2023, CT 07/14/2023, 05/01/2012, chest CT 05/06/2023 FINDINGS: CTA CHEST FINDINGS Cardiovascular: Non contrasted images of the chest demonstrate no acute intramural hematoma. Negative for aortic dissection or aneurysm. Cardiomegaly with marked biatrial enlargement. No significant pericardial effusion. Mediastinum/Nodes: Patent trachea. No suspicious thyroid mass. Subcentimeter mediastinal lymph nodes. Esophagus within normal limits Lungs/Pleura: Subpleural reticulation consistent with pulmonary fibrosis. Small left and moderate right pleural effusions. Mild diffuse hazy lung attenuation likely due to edema. Stable scattered small pulmonary nodules measuring up to 4 mm at the superior left lower lobe on series 7, image 49 for which no additional imaging follow-up is recommended. Musculoskeletal: Sternum is intact.  No acute osseous abnormality Review of the MIP images confirms the above findings. CTA ABDOMEN AND PELVIS FINDINGS VASCULAR Aorta: Normal caliber aorta without aneurysm, dissection, vasculitis or significant stenosis. Celiac: Patent without evidence of aneurysm, dissection, vasculitis or significant stenosis. Left gastric artery arises directly from the aorta, superior to the celiac trunk. SMA: Patent without evidence of aneurysm, dissection, vasculitis or significant  stenosis. Renals: Both renal arteries are patent without evidence of aneurysm, dissection, vasculitis, fibromuscular dysplasia  or significant stenosis. IMA: Patent without evidence of aneurysm, dissection, vasculitis or significant stenosis. Inflow: Patent without evidence of aneurysm, dissection, vasculitis or significant stenosis. Veins: Suboptimally assessed Review of the MIP images confirms the above findings. NON-VASCULAR Hepatobiliary: Cholecystectomy. No biliary dilatation. Question mild surface nodularity of the liver Pancreas: Unremarkable. No pancreatic ductal dilatation or surrounding inflammatory changes. Spleen: Normal in size without focal abnormality. Adrenals/Urinary Tract: Adrenal glands are normal. Kidneys show no hydronephrosis. Cyst mid to upper pole left kidney for which no imaging follow-up is recommended. The bladder is obscured by artifact Stomach/Bowel: Stomach nonenlarged. No dilated small bowel. No acute bowel wall thickening. Diverticular disease of the sigmoid colon. Negative appendix Lymphatic: No suspicious lymph nodes. Reproductive: Largely obscured by artifact.  No obvious adnexal mass Other: Negative for pelvic effusion or free air Musculoskeletal: Generalized subcutaneous edema. Bilateral hip replacements with artifact. Posterior spinal fusion hardware at L4-L5. Sacral Tarlov cysts. Review of the MIP images confirms the above findings. IMPRESSION: 1. Negative for acute aortic dissection or aneurysm. No significant vascular disease within the abdomen or pelvis 2. Small left and moderate right pleural effusions. Cardiomegaly with diffuse hazy pulmonary densities suspected to represent edema 3. Question surface nodularity of the liver as may be seen with cirrhosis, correlate for risk factors 4. No CT evidence for acute intra-abdominal or pelvic abnormality 5. Generalized subcutaneous edema consistent with anasarca 6. Diverticular disease of the sigmoid colon without acute inflammation  Electronically Signed   By: Jasmine Pang M.D.   On: 10/31/2023 18:30   DG Chest Portable 1 View Result Date: 10/31/2023 CLINICAL DATA:  Altered mental status EXAM: PORTABLE CHEST 1 VIEW COMPARISON:  10/01/2023 FINDINGS: Cardiomegaly with vascular congestion and mild diffuse interstitial opacity suspicious for edema. Probable small pleural effusions. No pneumothorax IMPRESSION: Cardiomegaly with vascular congestion and mild interstitial edema. Probable small effusions Electronically Signed   By: Jasmine Pang M.D.   On: 10/31/2023 16:07   CT HEAD WO CONTRAST Result Date: 10/31/2023 CLINICAL DATA:  Neuro deficit.  Evaluate for acute stroke. EXAM: CT HEAD WITHOUT CONTRAST TECHNIQUE: Contiguous axial images were obtained from the base of the skull through the vertex without intravenous contrast. RADIATION DOSE REDUCTION: This exam was performed according to the departmental dose-optimization program which includes automated exposure control, adjustment of the mA and/or kV according to patient size and/or use of iterative reconstruction technique. COMPARISON:  None Available. FINDINGS: Brain: No evidence of acute infarction, hemorrhage, hydrocephalus, extra-axial collection or mass lesion/mass effect. Vascular: No hyperdense vessel or unexpected calcification. Skull: Normal. Negative for fracture or focal lesion. Sinuses/Orbits: There is opacification of the visualized portions of the left maxillary sinus. The remaining paranasal sinuses are clear. Other: None. IMPRESSION: 1. No acute intracranial abnormalities. 2. Left maxillary sinus disease. Electronically Signed   By: Signa Kell M.D.   On: 10/31/2023 15:49    ECHO TEE 09/20/2023: 1. Left ventricular ejection fraction, by estimation, is 40 to 45%. The  left ventricle has mild to moderately decreased function.   2. Right ventricular systolic function is moderately reduced. The right  ventricular size is normal.   3. Left atrial size was dilated. A left  atrial/left atrial appendage  thrombus was detected.   4. Right atrial size was dilated.   5. The mitral valve was not assessed.   6. The aortic valve was not assessed.   TELEMETRY reviewed by me 11/02/2023: Atrial fibrillation rate 70s  EKG reviewed by me: Atrial fibrillation rate 86 bpm  Data reviewed by  me 11/02/2023: last 24h vitals tele labs imaging I/O hospitalist progress note  Principal Problem:   Acute on chronic HFrEF (heart failure with reduced ejection fraction) (HCC) Active Problems:   Hyponatremia   Essential hypertension   Hyperthyroidism   NICM , EF 40-45% 09/2023(nonischemic cardiomyopathy) (HCC)   Persistent atrial fibrillation (HCC)   Warfarin anticoagulation   Thrombus of left atrial appendage   Frailty/ physical deconditioning   Generalized weakness   Nonobstructive CAD on LHC 07/2023   Slurred speech   CHF exacerbation (HCC)    ASSESSMENT AND PLAN:  SERAFINA TOPHAM is a 89 y.o. female  with a past medical history of persistent atrial fibrillation, hypertension, hyperlipidemia, hypothyroidism who presented to the ED on 10/31/2023 for shortness of breath.  BNP found to be elevated.  Cardiology was consulted for further evaluation.   # Acute on chronic HFrEF # Persistent atrial fibrillation Patient presented to the ED with complaints of worsening shortness of breath.  BNP elevated at 818.  Troponins normal x 2.  Granddaughter also reports that she was having slurred speech, CT and MRI negative for acute stroke.  Recently was diagnosed with atrial fibrillation and cardioversion was attempted twice however these were both deferred as she has left atrial appendage thrombus. -Continue IV Lasix 20 mg twice daily.  She was not taking any p.o. diuretic prior to her admission. -Start spironolactone 12.5 mg daily.  Continue metoprolol succinate 125 mg daily, losartan 25 mg daily.  Patient would not be a good candidate for SGLT2 inhibitor as she has frequent UTIs. -Continue  warfarin as per pharmacy. -No plan for further cardiac diagnostics at this time.   This patient's plan of care was discussed and created with Dr. Juliann Pares and he is in agreement.  Signed: Gale Journey, PA-C  11/02/2023, 8:09 AM [facility] Cardiology

## 2023-11-02 NOTE — Progress Notes (Signed)
 Progress Note   Patient: Andrea Griffin:[id number] DOB: 20-Dec-1931 DOA: 10/31/2023     1 DOS: the patient was seen and examined on 11/02/2023     Brief hospital course: Andrea Griffin is a 89 y.o. female with medical history significant for  HTN, HLD, hyperthyroidism, nonobstructive CAD on cath November 2024, paroxysmal A-fib and left atrial appendage thrombus on Coumadin, cardiomyopathy (EF 40 to 45% on TEE 09/2023) last evaluated by her cardiologist outpatient a couple weeks prior on 10/18/2023 when she had ongoing exertional dyspnea and palpitations and left chest discomfort attributed to symptomatic persistent A-fib for which GDMT was adjusted(losartan added), who was brought to the ED with a complaint of slurred speech on awaking this morning as well as a several week history of weakness, malaise and generally feeling unwell.  Also found to have acute decompensation of congestive heart failure requiring admission.  Cardiologist following   Assessment and Plan:    Acute on chronic HFrEF (heart failure with reduced ejection fraction) (HCC) NICM, EF 40 to 45% on TEE 09/2023 Right pleural effusion/anasarca Respiratory viral panel negative for COVID flu and RSV Patient with shortness of breath, BNP 818 and CTA chest showing right pleural effusion, subcutaneous edema consistent with anasarca Continue IV Lasix Monitor input and output as well as daily weight Continue home GDMT with metoprolol and losartan, spironolactone added today by cardiologist Cardiologist on board and case discussed   Acute stroke ruled out Possible TIA MRI of the brain did not show any acute intracranial pathology Patient had 3 to 4-week history of speech difficulty that acutely worsening 24 hours.   Patient is at risk for CVA given left atrial thrombus, slightly subtherapeutic INR Head CT was nonacute Continue PT OT eval Continue neurochecks Lipid panel showing LDL 41, A1c 5.3   Nonobstructive CAD on [facility]  07/2023 Patient with occasional chest pain.  Troponin normal.  EKG nonacute Continue metoprolol, losartan, warfarin   Persistent atrial fibrillation (HCC) Thrombus left atrial appendage Warfarin anticoagulation, subtherapeutic at 1.9 Currently rate controlled with metoprolol Pharmacy is currently bridging warfarin with Lovenox until therapeutic   Hyponatremia Chronic.  Likely hypervolemic Expecting improvement with fluid restriction and diuretics Continue management as above   Frailty/ physical deconditioning Generalized weakness Patient with advanced age, multiple comorbidities, recent hospitalizations PT and TOC eval   Hyperthyroidism Will get her thyroid function panel Continue methimazole for now   Essential hypertension Continue metoprolol and losartan     DVT prophylaxis: coumadin   Consults: [facility] cardiology   Advance Care Planning:   Code Status: Limited: Do not attempt resuscitation (DNR) -DNR-LIMITED -Do Not Intubate/DNI     Family Communication: Husband and granddaughter at bedside   Disposition Plan: Back to previous home environment   Subjective:  Patient seen and examined at bedside this morning She admits to improvement in respiratory function Denies nausea vomiting chest pain abdominal pain Still diuresing very well   Physical Exam:   Vitals and nursing note reviewed.  Constitutional:      General: She is sleeping. She is not in acute distress. HENT:     Head: Normocephalic and atraumatic.  Cardiovascular:     Rate and Rhythm: Normal rate and regular rhythm.     Heart sounds: Normal heart sounds.  Pulmonary: Lower lung base crackles heard Abdominal:     Palpations: Abdomen is soft.     Tenderness: There is no abdominal tenderness.  Musculoskeletal: Mild bilateral lower extremity pitting edema Neurological:     Mental Status:  She is easily aroused. Mental status is at baseline.       Data Reviewed:    Latest Ref Rng & Units 11/02/2023     4:23 AM 11/01/2023    4:51 AM 10/31/2023    3:45 PM  BMP  Glucose 70 - 99 mg/dL 78  86  161   BUN 8 - 23 mg/dL 10  10  12    Creatinine 0.44 - 1.00 mg/dL 0.96  0.45  4.09   Sodium 135 - 145 mmol/L 129  127  129   Potassium 3.5 - 5.1 mmol/L 3.2  3.5  3.7   Chloride 98 - 111 mmol/L 90  92  94   CO2 22 - 32 mmol/L 29  26  26    Calcium 8.9 - 10.3 mg/dL 8.6  8.4  8.7        Latest Ref Rng & Units 11/02/2023    4:23 AM 11/01/2023    4:51 AM 10/31/2023    3:45 PM  CBC  WBC 4.0 - 10.5 K/uL 7.7  8.2  8.4   Hemoglobin 12.0 - 15.0 g/dL 81.1  91.4  78.2   Hematocrit 36.0 - 46.0 % 34.5  32.9  37.1   Platelets 150 - 400 K/uL 298  274  342     Vitals:   11/02/23 0401 11/02/23 0500 11/02/23 0746 11/02/23 0800  BP: 137/63  139/61 139/61  Pulse: 71  78 78  Resp: 18     Temp: (!) 97.5 F (36.4 C)  98.2 F (36.8 C)   TempSrc: Oral  Oral   SpO2: 100%  99%   Weight:  63.1 kg    Height:        Author: Loyce Dys, MD 11/02/2023 5:00 PM  For on call review [email or web address].

## 2023-11-02 NOTE — TOC Initial Note (Signed)
 Transition of Care [facility]) - Initial/Assessment Note    Patient Details  Name: Andrea Griffin MRN: [id number] Date of Birth: 05-Jul-1932  Transition of Care [facility]) CM/SW Contact:    Margarito Liner, LCSW Phone Number: 11/02/2023, 4:13 PM  Clinical Narrative: Readmission prevention screen complete. CSW met with patient. Husband and granddaughter at bedside. CSW introduced role and explained that discharge planning would be discussed. PCP is Dr. Burnadette Pop. Granddaughter or husband drive her to appointments. She uses [facility]. No issues obtaining medications. Patient lives home with her husband. Granddaughter comes to the home on Sundays and Wednesday to check their medications, do laundry, clean, etc. She is likely going to be moving in with them soon. CSW gave her brochures for medical alert devices. Also provided phone number for [employer] representative in case she wants personal care services in the future. No home health prior to discharge. MD will consult PT and OT. If she qualifies, granddaughter would like to use [facility] as husband is supposed to starting services with them. She has a RW and SPC. She does not wear oxygen at home. No further concerns. CSW will continue to follow patient and her family for support and facilitate return home once stable. Granddaughter will transport her home at discharge.                Expected Discharge Plan: Home w Home Health Services Barriers to Discharge: Continued Medical Work up   Patient Goals and CMS Choice            Expected Discharge Plan and Services     Post Acute Care Choice:  (TBD) Living arrangements for the past 2 months: Single Family Home                                      Prior Living Arrangements/Services Living arrangements for the past 2 months: Single Family Home Lives with:: Spouse Patient language and need for interpreter reviewed:: Yes Do you feel safe going back to the place where you live?:  Yes      Need for Family Participation in Patient Care: Yes (Comment) Care giver support system in place?: Yes (comment) Current home services: DME Criminal Activity/Legal Involvement Pertinent to Current Situation/Hospitalization: No - Comment as needed  Activities of Daily Living   ADL Screening (condition at time of admission) Independently performs ADLs?: Yes (appropriate for developmental age) Is the patient deaf or have difficulty hearing?: No Does the patient have difficulty seeing, even when wearing glasses/contacts?: No Does the patient have difficulty concentrating, remembering, or making decisions?: Yes  Permission Sought/Granted Permission sought to share information with : Family Supports    Share Information with NAME: Earle and Zerah Hilyer     Permission granted to share info w Relationship: Husband and Granddaughter  Permission granted to share info w Contact Information: Earle: [phone]: [phone]  Emotional Assessment Appearance:: Appears stated age Attitude/Demeanor/Rapport: Engaged, Gracious Affect (typically observed): Accepting, Appropriate, Calm, Pleasant Orientation: : Oriented to Self, Oriented to Place, Oriented to  Time, Oriented to Situation Alcohol / Substance Use: Not Applicable Psych Involvement: No (comment)  Admission diagnosis:  CHF exacerbation (HCC) [I50.9] Acute respiratory failure with hypoxia (HCC) [J96.01] Altered mental status, unspecified altered mental status type [R41.82] Acute congestive heart failure, unspecified heart failure type [facility]) [I50.9] Patient Active Problem List   Diagnosis Date Noted   CHF exacerbation (HCC) 11/01/2023  Acute on chronic HFrEF (heart failure with reduced ejection fraction) (HCC) 10/31/2023   NICM , EF 40-45% 09/2023(nonischemic cardiomyopathy) (HCC) 10/31/2023   Warfarin anticoagulation 10/31/2023   Thrombus of left atrial appendage 10/31/2023   Frailty/ physical deconditioning 10/31/2023    Generalized weakness 10/31/2023   Nonobstructive CAD on [facility] 07/2023 10/31/2023   Slurred speech 10/31/2023   Persistent atrial fibrillation (HCC)    Sepsis due to pneumonia (HCC) 07/15/2023   Acute lower UTI 07/15/2023   Atrial fibrillation with RVR (HCC) 07/15/2023   NSTEMI (non-ST elevated myocardial infarction) (HCC) 07/14/2023   Hyperthyroidism 05/05/2023   Hyponatremia 05/04/2023   Leukocytosis 05/04/2023   DNR (do not resuscitate) 05/04/2023   Essential hypertension 05/04/2023   GERD (gastroesophageal reflux disease) 05/04/2023   Spondylolisthesis of lumbar region 01/21/2016   PCP:  Marisue Ivan, MD Pharmacy:   [facility] - [place], [place] - [address] [place] [address] Phone: [phone] Fax: [phone]     Social Drivers of Health (SDOH) Social History: SDOH Screenings   Food Insecurity: No Food Insecurity (11/02/2023)  Housing: Low Risk  (11/02/2023)  Transportation Needs: No Transportation Needs (11/02/2023)  Utilities: Not At Risk (11/02/2023)  Financial Resource Strain: Low Risk  (07/14/2023)   Received from [facility]  Social Connections: Socially Integrated (11/02/2023)  Tobacco Use: Low Risk  (10/31/2023)   SDOH Interventions:     Readmission Risk Interventions    11/02/2023    4:07 PM  Readmission Risk Prevention Plan  Transportation Screening Complete  PCP or Specialist Appt within 3-5 Days Complete  Social Work Consult for Recovery Care Planning/Counseling Complete  Palliative Care Screening Not Applicable  Medication Review [employer]) Complete

## 2023-11-02 NOTE — Progress Notes (Signed)
 PHARMACY - ANTICOAGULATION CONSULT NOTE  Pharmacy Consult for warfarin and enoxaparin Indication: atrial fibrillation and DVT  Allergies  Allergen Reactions   Ciprofloxacin Hives   Adhesive [Tape]     Pulls off skin   Nitrofurantoin Other (See Comments)    Can not remember reaction   Phenazopyridine Other (See Comments)    Pt doesn't remember   Ranitidine Other (See Comments)    Can not remember reaction   Sulfa Antibiotics Other (See Comments)    Can not remember reaction    Patient Measurements: Height: 5\' 1"  (154.9 cm) Weight: 63.1 kg (139 lb 1.8 oz) IBW/kg (Calculated) : 47.8  Vital Signs: Temp: 98.2 F (36.8 C) (03/04 0746) Temp Source: Oral (03/04 0746) BP: 139/61 (03/04 0800) Pulse Rate: 78 (03/04 0800)  Labs: Recent Labs    10/31/23 1545 10/31/23 1745 11/01/23 0451 11/02/23 0423  HGB 12.2  --  10.9* 11.5*  HCT 37.1  --  32.9* 34.5*  PLT 342  --  274 298  APTT 47*  --   --   --   LABPROT 22.0*  --  22.2* 26.3*  INR 1.9*  --  1.9* 2.4*  CREATININE 0.45  --  0.56 0.39*  TROPONINIHS 14 14  --   --     Estimated Creatinine Clearance: 40.6 mL/min (A) (by C-G formula based on SCr of 0.39 mg/dL (L)).   Medical History: Past Medical History:  Diagnosis Date   Anxiety    Arthritis    Asthma    Cancer (HCC)    melanoma skin cancer on head   Diastolic dysfunction    a. 05/2023 Echo: EF 70-76^, no rwma, GrII DD, nl RV fxn, mildly dil RA, mod dil LA. Mild MR. Mod AI.   GERD (gastroesophageal reflux disease)    History of recurrent cystitis    Hyperlipidemia    Hypertension    Infection of prosthetic hip joint (HCC)    Mitral regurgitation    Moderate aortic insufficiency    Nocturia    Obesity    Osteopenia    Persistent atrial fibrillation (HCC)    Thyroid disease    Urinary frequency    Urinary incontinence    Urinary urgency    Wears glasses    Assessment: 89 y/o female presenting with slurred speech. PMH significant for afib and left atrial  thrombus (seen on ECHO from 09/2023) on warfarin PTA (recently started in 09/2023), HTN, HLD, and asthma. Pharmacy has been consulted to resume PTA warfarin.  Warfarin regimen: 2.5 mg on Tue/Thurs/Sat and 5 mg on Mon, Wed, Fri, and Sun (Total weekly dose: 27.5) Last dose: 2.5 mg on 10/30/23   Goal of Therapy:  INR 2.0-3.0 for afib , targeting 2.5-3.0   (per granddaughter, cardiologist at [facility] is targeting higher end of range due to left atrial thrombus) Monitor platelets by anticoagulation protocol: Yes   Date INR Warfarin Dose Comments  3/2 1.9 6mg  Admitted with slurred speech and AMS (possible TIA)  3/3 1.9 7.5 mg Start enoxaparin 1mg /kg q 12hours until INR >/=2.5  3/4 2.4 2.5 mg    Plan:  INR is slightly subtherapeutic. Will give warfarin 2.5 mg x 1 (home dose). Predict INR will trend up tomorrow. Will continue enoxaparin until INR > 2.5. Daily INR. CBC at least every 3 days.   Paschal Dopp, PharmD, BCPS 11/02/2023 9:23 AM

## 2023-11-02 NOTE — Progress Notes (Signed)
 Patient bladder scanned for 353 ml. Patient up BSC urine output 175 ml and a large BM

## 2023-11-03 ENCOUNTER — Other Ambulatory Visit (HOSPITAL_COMMUNITY): Payer: Self-pay

## 2023-11-03 ENCOUNTER — Telehealth (HOSPITAL_COMMUNITY): Payer: Self-pay

## 2023-11-03 ENCOUNTER — Inpatient Hospital Stay

## 2023-11-03 DIAGNOSIS — I5023 Acute on chronic systolic (congestive) heart failure: Secondary | ICD-10-CM | POA: Diagnosis not present

## 2023-11-03 LAB — CBC
HCT: 34 % — ABNORMAL LOW (ref 36.0–46.0)
Hemoglobin: 11.8 g/dL — ABNORMAL LOW (ref 12.0–15.0)
MCH: 30.6 pg (ref 26.0–34.0)
MCHC: 34.7 g/dL (ref 30.0–36.0)
MCV: 88.1 fL (ref 80.0–100.0)
Platelets: 304 10*3/uL (ref 150–400)
RBC: 3.86 MIL/uL — ABNORMAL LOW (ref 3.87–5.11)
RDW: 14.7 % (ref 11.5–15.5)
WBC: 8 10*3/uL (ref 4.0–10.5)
nRBC: 0 % (ref 0.0–0.2)

## 2023-11-03 LAB — BASIC METABOLIC PANEL
Anion gap: 8 (ref 5–15)
BUN: 12 mg/dL (ref 8–23)
CO2: 28 mmol/L (ref 22–32)
Calcium: 8.5 mg/dL — ABNORMAL LOW (ref 8.9–10.3)
Chloride: 92 mmol/L — ABNORMAL LOW (ref 98–111)
Creatinine, Ser: 0.42 mg/dL — ABNORMAL LOW (ref 0.44–1.00)
GFR, Estimated: >60 mL/min (ref >60)
Glucose, Bld: 80 mg/dL (ref 70–99)
Potassium: 3.8 mmol/L (ref 3.5–5.1)
Sodium: 128 mmol/L — ABNORMAL LOW (ref 135–145)

## 2023-11-03 LAB — TSH: TSH: 2.236 u[IU]/mL (ref 0.350–4.500)

## 2023-11-03 LAB — PROTIME-INR
INR: 2.6 — ABNORMAL HIGH (ref 0.8–1.2)
Prothrombin Time: 27.9 s — ABNORMAL HIGH (ref 11.4–15.2)

## 2023-11-03 MED ORDER — METOPROLOL SUCCINATE ER 25 MG PO TB24
125.0000 mg | ORAL_TABLET | Freq: Every day | ORAL | Status: DC
  Administered 2023-11-04 – 2023-11-05 (×2): 125 mg via ORAL
  Filled 2023-11-03 (×2): qty 1

## 2023-11-03 MED ORDER — SACUBITRIL-VALSARTAN 24-26 MG PO TABS
1.0000 | ORAL_TABLET | Freq: Two times a day (BID) | ORAL | Status: DC
Start: 2023-11-03 — End: 2023-11-05
  Administered 2023-11-03 – 2023-11-05 (×5): 1 via ORAL
  Filled 2023-11-03 (×5): qty 1

## 2023-11-03 MED ORDER — WARFARIN SODIUM 5 MG PO TABS
5.0000 mg | ORAL_TABLET | Freq: Once | ORAL | Status: AC
  Administered 2023-11-03: 5 mg via ORAL
  Filled 2023-11-03: qty 1

## 2023-11-03 NOTE — Evaluation (Addendum)
 Physical Therapy Evaluation Patient Details Name: Andrea Griffin MRN: [id number] DOB: [birth year] Today's Date: 11/03/2023  History of Present Illness  Andrea Griffin is a 89 y.o. female  with a past medical history of persistent atrial fibrillation, hypertension, L atrial thrombus, hyperlipidemia, hypothyroidism who presented to the ED on 10/31/2023 for shortness of breath.  Clinical Impression  Patient alert, seated EOB with OT, family at bedside. Pt's PLOF included use of SPC intermittently, family present assists with IADLs. She was able to stand with handheld assist, initially minA and progressed to using RW minA, but with time faded to CGA. Noted for trendelenburg like gait, with short shuffled steps, family endorsed this was baseline. Pt reported fatigue, able to ambulate ~79ft, chair follow necessary. Pt returned to room and supine with supervision.  Overall the patient demonstrated deficits (see "PT Problem List") that impede the patient's functional abilities, safety, and mobility and would benefit from skilled PT intervention.          If plan is discharge home, recommend the following: A little help with walking and/or transfers;A little help with bathing/dressing/bathroom;Assistance with cooking/housework;Assist for transportation;Help with stairs or ramp for entrance   Can travel by private vehicle        Equipment Recommendations None recommended by PT  Recommendations for Other Services       Functional Status Assessment Patient has had a recent decline in their functional status and demonstrates the ability to make significant improvements in function in a reasonable and predictable amount of time.     Precautions / Restrictions Precautions Precautions: Fall Restrictions Weight Bearing Restrictions Per Provider Order: No      Mobility  Bed Mobility Overal bed mobility: Needs Assistance Bed Mobility: Sit to Supine       Sit to supine: Supervision, HOB elevated    General bed mobility comments: sitting EOB with OT on PT arrival    Transfers Overall transfer level: Needs assistance Equipment used: Rolling walker (2 wheels), 1 person hand held assist Transfers: Sit to/from Stand Sit to Stand: Contact guard assist, Min assist           General transfer comment: minA for handheld assist, CGA with RW    Ambulation/Gait Ambulation/Gait assistance: Contact guard assist, Min assist Gait Distance (Feet): 55 Feet Assistive device: Rolling walker (2 wheels), None         General Gait Details: ambulation attempted without RW twice, but noted for improved stability and tolerance with it, and pt agreeable to use. pt appeared to have trendelenburg like gait with short steps, family endorsed this was her baseline  Acupuncturist Bed    Modified Rankin (Stroke Patients Only)       Balance Overall balance assessment: Needs assistance Sitting-balance support: Feet supported Sitting balance-Leahy Scale: Fair     Standing balance support: Bilateral upper extremity supported Standing balance-Leahy Scale: Fair                               Pertinent Vitals/Pain Pain Assessment Pain Assessment: No/denies pain    Home Living Family/patient expects to be discharged to:: Private residence Living Arrangements: Spouse/significant other Available Help at Discharge: Family (sunday/wednesday all day, drives to dr appts) Type of Home: House           Home Equipment: Agricultural consultant (2 wheels);Rollator (4 wheels);Cane -  single point;BSC/3in1;Grab bars - toilet;Shower seat;Cane - quad;Hand held shower head      Prior Function               Mobility Comments: amb with SPC PRN home/community distances ADLs Comments: grand daugther does medicines, laundry, cleaning, driving (they will drive PRN)     Extremity/Trunk Assessment   Upper Extremity Assessment Upper Extremity Assessment:  Generalized weakness    Lower Extremity Assessment Lower Extremity Assessment: Generalized weakness       Communication        Cognition Arousal: Alert Behavior During Therapy: WFL for tasks assessed/performed   PT - Cognitive impairments: No apparent impairments                                 Cueing       General Comments      Exercises     Assessment/Plan    PT Assessment Patient needs continued PT services  PT Problem List Decreased strength;Decreased activity tolerance;Decreased knowledge of use of DME;Decreased balance;Decreased safety awareness;Decreased mobility;Decreased knowledge of precautions       PT Treatment Interventions DME instruction;Balance training;Gait training;Neuromuscular re-education;Stair training;Patient/family education;Functional mobility training;Therapeutic activities;Therapeutic exercise    PT Goals (Current goals can be found in the Care Plan section)  Acute Rehab PT Goals Patient Stated Goal: to feel better PT Goal Formulation: With patient Time For Goal Achievement: 11/17/23 Potential to Achieve Goals: Good    Frequency Min 2X/week     Co-evaluation               AM-PAC PT "6 Clicks" Mobility  Outcome Measure Help needed turning from your back to your side while in a flat bed without using bedrails?: A Little Help needed moving from lying on your back to sitting on the side of a flat bed without using bedrails?: A Little Help needed moving to and from a bed to a chair (including a wheelchair)?: A Little Help needed standing up from a chair using your arms (e.g., wheelchair or bedside chair)?: A Little Help needed to walk in hospital room?: A Little Help needed climbing 3-5 steps with a railing? : A Little 6 Click Score: 18    End of Session   Activity Tolerance: Patient tolerated treatment well Patient left: in bed;with call bell/phone within reach;with bed alarm set;with family/visitor present Nurse  Communication: Mobility status PT Visit Diagnosis: Other abnormalities of gait and mobility (R26.89);Difficulty in walking, not elsewhere classified (R26.2);Muscle weakness (generalized) (M62.81)    Time: 0981-1914 PT Time Calculation (min) (ACUTE ONLY): 17 min   Charges:   PT Evaluation $PT Eval Low Complexity: 1 Low PT Treatments $Therapeutic Activity: 8-22 mins PT General Charges $$ ACUTE PT VISIT: 1 Visit        Olga Coaster PT, DPT 3:21 PM,11/03/23

## 2023-11-03 NOTE — Progress Notes (Signed)
 PROGRESS NOTE    Andrea Griffin   ZOX:[id number] DOB: 09-18-1931  DOA: 10/31/2023 Date of Service: 11/03/23 which is hospital day 2  PCP: Marisue Ivan, MD    Hospital course / significant events:   HPI: Andrea Griffin is a 89 y.o. female with medical history significant for  HTN, HLD, hyperthyroidism, nonobstructive CAD on cath November 2024, paroxysmal A-fib and left atrial appendage thrombus on Coumadin, cardiomyopathy (EF 40 to 45% on TEE 09/2023) last evaluated by her cardiologist outpatient a couple weeks prior on 10/18/2023 when she had ongoing exertional dyspnea and palpitations and left chest discomfort attributed to symptomatic persistent A-fib for which GDMT was adjusted(losartan added), who was brought to the ED with a complaint of slurred speech on awaking this morning as well as a several week history of weakness, malaise and generally feeling unwell.    03/02: to ED, admitted to hospitalist w/ acute on chronic HFrEF, acute respiratory failure d/t CHF and pleural effusion, question TIA/CVA, chronic hyponatremia. CT/MRI negative for CVA. 03/03: cardiology consulted, advise continue diuresis which pt was not on prior to hospitalization, consider add spironolactone, no further diagnostics  03/04: conitnuing IV diuresis 03/05: pt c/o more SOB today, reviewed CT images which did show significant effusion, US thoracentesis ordered if enough fluid to pull based on [place], IR eval and effusion had reduced sufficiently that no thoracentesis was performed      Consultants:  Cardiology   Procedures/Surgeries: none      ASSESSMENT & PLAN:  Acute on chronic HFrEF (heart failure with reduced ejection fraction)  NICM, EF 40 to 45% on TEE 09/2023 Right pleural effusion/anasarca Continue IV Lasix Monitor input and output as well as daily weight GDMT with metoprolol and losartan, spironolactone. NO SGLT2 d/t frequent UTI Cardiologist following    Acute stroke ruled out Possible  TIA MRI of the brain did not show any acute intracranial pathology PT OT eval  Nonobstructive CAD on [facility] 07/2023 Patient with occasional chest pain.  Troponin normal.  EKG nonacute Continue metoprolol, losartan, warfarin   Persistent atrial fibrillation  Thrombus left atrial appendage Warfarin anticoagulation, subtherapeutic at 1.9 on arrival, now at 2.6 Currently rate controlled with metoprolol bridging warfarin with Lovenox until therapeutic, can d/c Lovenox today INR is at goal    Hyponatremia Chronic.  Likely hypervolemic Expecting improvement with fluid restriction and diuretics Continue management as above Monitor BMP   Frailty/ physical deconditioning Generalized weakness Patient with advanced age, multiple comorbidities, recent hospitalizations PT/OT and TOC eval   Hyperthyroidism Will get her thyroid function panel Continue methimazole for now   Essential hypertension Continue metoprolol and losartan    DVT prophylaxis: coumadin IV fluids: no continuous IV fluids  Nutrition: cardiac diet  Central lines / invasive devices: none  Code Status: DNR ACP documentation reviewed: advanced directive from 2017 on file in [facility] however this form appears to be for an Governor Rooks NOT for this patient - was voided   TOC needs: home health  Barriers to dispo / significant pending items: IV diuresis today, anticipate possible discharge next 1-2 days              Subjective / Brief ROS:  Patient reports brief chest pain this morning, brief episode dizziness this morning she thinks along with the chest pain Denies CP now Reports SOB a bit worse today  Pain controlled.  Denies new weakness.  Tolerating diet.  Reports no concerns w/ urination/defecation.   Family Communication: family at bedside on rounds  Objective Findings:  Vitals:   11/03/23 0500 11/03/23 0751 11/03/23 1126 11/03/23 1449  BP:  (!) 147/61 124/69 125/62  Pulse:  82 75 70  Resp:  19  19   Temp:  98 F (36.7 C) 98.6 F (37 C)   TempSrc:      SpO2:  94% 97% 98%  Weight: 63.3 kg     Height:        Intake/Output Summary (Last 24 hours) at 11/03/2023 1617 Last data filed at 11/03/2023 0900 Gross per 24 hour  Intake 360 ml  Output 1050 ml  Net -690 ml   Filed Weights   11/02/23 0500 11/03/23 0457 11/03/23 0500  Weight: 63.1 kg 63.1 kg 63.3 kg    Examination:  Physical Exam Constitutional:      General: She is not in acute distress. Cardiovascular:     Rate and Rhythm: Normal rate and regular rhythm.  Pulmonary:     Effort: No accessory muscle usage or respiratory distress.     Breath sounds: Examination of the right-middle field reveals decreased breath sounds. Examination of the right-lower field reveals decreased breath sounds. Examination of the left-lower field reveals rales. Decreased breath sounds and rales present. No rhonchi.  Neurological:     Mental Status: She is alert.          Scheduled Medications:   escitalopram  5 mg Oral Daily   furosemide  20 mg Intravenous Q12H   guaiFENesin  600 mg Oral BID   methIMAzole  2.5 mg Oral Daily   [START ON 11/04/2023] metoprolol succinate  125 mg Oral Daily   mometasone-formoterol  2 puff Inhalation BID   sacubitril-valsartan  1 tablet Oral BID   spironolactone  12.5 mg Oral Daily   Warfarin - Pharmacist Dosing Inpatient   Does not apply q1600    Continuous Infusions:   PRN Medications:  acetaminophen **OR** acetaminophen, albuterol, HYDROcodone-acetaminophen, ondansetron **OR** ondansetron (ZOFRAN) IV  Antimicrobials from admission:  Anti-infectives (From admission, onward)    None           Data Reviewed:  I have personally reviewed the following...  CBC: Recent Labs  Lab 10/31/23 1545 11/01/23 0451 11/02/23 0423 11/03/23 0436  WBC 8.4 8.2 7.7 8.0  NEUTROABS 5.7  --   --   --   HGB 12.2 10.9* 11.5* 11.8*  HCT 37.1 32.9* 34.5* 34.0*  MCV 91.8 91.9 89.8 88.1  PLT 342 274 298  304   Basic Metabolic Panel: Recent Labs  Lab 10/31/23 1545 11/01/23 0451 11/02/23 0423 11/03/23 0436  NA 129* 127* 129* 128*  K 3.7 3.5 3.2* 3.8  CL 94* 92* 90* 92*  CO2 26 26 29 28   GLUCOSE 102* 86 78 80  BUN 12 10 10 12   CREATININE 0.45 0.56 0.39* 0.42*  CALCIUM 8.7* 8.4* 8.6* 8.5*   GFR: Estimated Creatinine Clearance: 40.6 mL/min (A) (by C-G formula based on SCr of 0.42 mg/dL (L)). Liver Function Tests: Recent Labs  Lab 10/31/23 1545  AST 29  ALT 22  ALKPHOS 79  BILITOT 1.5*  PROT 7.2  ALBUMIN 3.6   No results for input(s): "LIPASE", "AMYLASE" in the last 168 hours. No results for input(s): "AMMONIA" in the last 168 hours. Coagulation Profile: Recent Labs  Lab 10/31/23 1545 11/01/23 0451 11/02/23 0423 11/03/23 0436  INR 1.9* 1.9* 2.4* 2.6*   Cardiac Enzymes: No results for input(s): "CKTOTAL", "CKMB", "CKMBINDEX", "TROPONINI" in the last 168 hours. BNP (last 3 results) No results for  input(s): "PROBNP" in the last 8760 hours. HbA1C: Recent Labs    11/01/23 0451  HGBA1C 5.3   CBG: Recent Labs  Lab 10/31/23 1500  GLUCAP 121*   Lipid Profile: Recent Labs    11/01/23 0451  CHOL 85  HDL 31*  LDLCALC 41  TRIG 67  CHOLHDL 2.7   Thyroid Function Tests: Recent Labs    11/03/23 0435  TSH 2.236   Anemia Panel: No results for input(s): "VITAMINB12", "FOLATE", "FERRITIN", "TIBC", "IRON", "RETICCTPCT" in the last 72 hours. Most Recent Urinalysis On File:     Component Value Date/Time   COLORURINE YELLOW (A) 10/31/2023 1545   APPEARANCEUR CLEAR (A) 10/31/2023 1545   APPEARANCEUR Clear 03/13/2014 1403   LABSPEC 1.018 10/31/2023 1545   LABSPEC 1.030 03/13/2014 1403   PHURINE 8.0 10/31/2023 1545   GLUCOSEU NEGATIVE 10/31/2023 1545   GLUCOSEU Negative 03/13/2014 1403   HGBUR NEGATIVE 10/31/2023 1545   BILIRUBINUR NEGATIVE 10/31/2023 1545   BILIRUBINUR Negative 03/13/2014 1403   KETONESUR NEGATIVE 10/31/2023 1545   PROTEINUR 100 (A)  10/31/2023 1545   NITRITE NEGATIVE 10/31/2023 1545   LEUKOCYTESUR NEGATIVE 10/31/2023 1545   LEUKOCYTESUR Negative 03/13/2014 1403   Sepsis Labs: @LABRCNTIP (procalcitonin:4,lacticidven:4) Microbiology: Recent Results (from the past 240 hours)  Resp panel by RT-PCR (RSV, Flu A&B, Covid) Anterior Nasal Swab     Status: None   Collection Time: 10/31/23  3:45 PM   Specimen: Anterior Nasal Swab  Result Value Ref Range Status   SARS Coronavirus 2 by RT PCR NEGATIVE NEGATIVE Final    Comment: (NOTE) SARS-CoV-2 target nucleic acids are NOT DETECTED.  The SARS-CoV-2 RNA is generally detectable in upper respiratory specimens during the acute phase of infection. The lowest concentration of SARS-CoV-2 viral copies this assay can detect is 138 copies/mL. A negative result does not preclude SARS-Cov-2 infection and should not be used as the sole basis for treatment or other patient management decisions. A negative result may occur with  improper specimen collection/handling, submission of specimen other than nasopharyngeal swab, presence of viral mutation(s) within the areas targeted by this assay, and inadequate number of viral copies(<138 copies/mL). A negative result must be combined with clinical observations, patient history, and epidemiological information. The expected result is Negative.  Fact Sheet for Patients:  BloggerCourse.com  Fact Sheet for Healthcare Providers:  SeriousBroker.it  This test is no t yet approved or cleared by the [place] FDA and  has been authorized for detection and/or diagnosis of SARS-CoV-2 by FDA under an Emergency Use Authorization (EUA). This EUA will remain  in effect (meaning this test can be used) for the duration of the COVID-19 declaration under Section 564(b)(1) of the Act, 21 U.S.C.section 360bbb-3(b)(1), unless the authorization is terminated  or revoked sooner.       Influenza A by PCR  NEGATIVE NEGATIVE Final   Influenza B by PCR NEGATIVE NEGATIVE Final    Comment: (NOTE) The Xpert Xpress SARS-CoV-2/FLU/RSV plus assay is intended as an aid in the diagnosis of influenza from Nasopharyngeal swab specimens and should not be used as a sole basis for treatment. Nasal washings and aspirates are unacceptable for Xpert Xpress SARS-CoV-2/FLU/RSV testing.  Fact Sheet for Patients: BloggerCourse.com  Fact Sheet for Healthcare Providers: SeriousBroker.it  This test is not yet approved or cleared by the [place] FDA and has been authorized for detection and/or diagnosis of SARS-CoV-2 by FDA under an Emergency Use Authorization (EUA). This EUA will remain in effect (meaning this test can be used) for  the duration of the COVID-19 declaration under Section 564(b)(1) of the Act, 21 U.S.C. section 360bbb-3(b)(1), unless the authorization is terminated or revoked.     Resp Syncytial Virus by PCR NEGATIVE NEGATIVE Final    Comment: (NOTE) Fact Sheet for Patients: BloggerCourse.com  Fact Sheet for Healthcare Providers: SeriousBroker.it  This test is not yet approved or cleared by the [place] FDA and has been authorized for detection and/or diagnosis of SARS-CoV-2 by FDA under an Emergency Use Authorization (EUA). This EUA will remain in effect (meaning this test can be used) for the duration of the COVID-19 declaration under Section 564(b)(1) of the Act, 21 U.S.C. section 360bbb-3(b)(1), unless the authorization is terminated or revoked.  Performed at [facility], [address], [place], [place] [address]       Radiology Studies last 3 days: [place] CHEST (PLEURAL EFFUSION) Result Date: 11/03/2023 INDICATION: Shortness of breath, right pleural effusion EXAM: CHEST ULTRASOUND COMPARISON:  CTA CHEST 10/31/23 FINDINGS: Small right-sided pleural effusion. No  significant pocket of fluid or percutaneous window to allow safe thoracentesis. Risks outweigh the benefits. IMPRESSION: Small RIGHT pleural effusion with no safe window for percutaneous access Performed by: Loyce Dys PA-C Electronically Signed   By: Judie Petit.  Shick M.D.   On: 11/03/2023 15:37   MR BRAIN WO CONTRAST Result Date: 11/01/2023 CLINICAL DATA:  Initial evaluation for acute neuro deficit, stroke suspected. EXAM: MRI HEAD WITHOUT CONTRAST TECHNIQUE: Multiplanar, multiecho pulse sequences of the brain and surrounding structures were obtained without intravenous contrast. COMPARISON:  Prior CT from earlier the same day. FINDINGS: Brain: Cerebral volume within normal limits. Patchy T2/FLAIR hyperintensity involving the periventricular white matter, most characteristic of chronic microvascular ischemic disease, mild for age. No evidence for acute or subacute ischemia. No areas of chronic cortical infarction. No acute or chronic intracranial blood products. No mass lesion, midline shift or mass effect. No hydrocephalus or extra-axial fluid collection. Pituitary gland and suprasellar region within normal limits. Vascular: Major intracranial vascular flow voids are maintained. Skull and upper cervical spine: Craniocervical junction within normal limits. Bone marrow signal intensity normal. No scalp soft tissue abnormality. Sinuses/Orbits: Prior bilateral ocular lens replacement. Chronic left maxillary sinusitis noted. Paranasal sinuses are otherwise largely clear. Trace bilateral mastoid effusions noted, of doubtful significance. Other: None. IMPRESSION: 1. No acute intracranial abnormality. 2. Mild chronic microvascular ischemic disease for age. 3. Chronic left maxillary sinusitis. Electronically Signed   By: Rise Mu M.D.   On: 11/01/2023 00:04   CT Angio Chest/Abd/Pel for Dissection W and/or W/WO Result Date: 10/31/2023 CLINICAL DATA:  Difficulty speaking altered EXAM: CT ANGIOGRAPHY CHEST,  ABDOMEN AND PELVIS TECHNIQUE: Non-contrast CT of the chest was initially obtained. Multidetector CT imaging through the chest, abdomen and pelvis was performed using the standard protocol during bolus administration of intravenous contrast. Multiplanar reconstructed images and MIPs were obtained and reviewed to evaluate the vascular anatomy. RADIATION DOSE REDUCTION: This exam was performed according to the departmental dose-optimization program which includes automated exposure control, adjustment of the mA and/or kV according to patient size and/or use of iterative reconstruction technique. CONTRAST:  OMNIPAQUE IOHEXOL 350 MG/ML SOLN COMPARISON:  Chest x-ray 10/31/2023, CT 07/14/2023, 05/01/2012, chest CT 05/06/2023 FINDINGS: CTA CHEST FINDINGS Cardiovascular: Non contrasted images of the chest demonstrate no acute intramural hematoma. Negative for aortic dissection or aneurysm. Cardiomegaly with marked biatrial enlargement. No significant pericardial effusion. Mediastinum/Nodes: Patent trachea. No suspicious thyroid mass. Subcentimeter mediastinal lymph nodes. Esophagus within normal limits Lungs/Pleura: Subpleural reticulation consistent with pulmonary  fibrosis. Small left and moderate right pleural effusions. Mild diffuse hazy lung attenuation likely due to edema. Stable scattered small pulmonary nodules measuring up to 4 mm at the superior left lower lobe on series 7, image 49 for which no additional imaging follow-up is recommended. Musculoskeletal: Sternum is intact.  No acute osseous abnormality Review of the MIP images confirms the above findings. CTA ABDOMEN AND PELVIS FINDINGS VASCULAR Aorta: Normal caliber aorta without aneurysm, dissection, vasculitis or significant stenosis. Celiac: Patent without evidence of aneurysm, dissection, vasculitis or significant stenosis. Left gastric artery arises directly from the aorta, superior to the celiac trunk. SMA: Patent without evidence of aneurysm,  dissection, vasculitis or significant stenosis. Renals: Both renal arteries are patent without evidence of aneurysm, dissection, vasculitis, fibromuscular dysplasia or significant stenosis. IMA: Patent without evidence of aneurysm, dissection, vasculitis or significant stenosis. Inflow: Patent without evidence of aneurysm, dissection, vasculitis or significant stenosis. Veins: Suboptimally assessed Review of the MIP images confirms the above findings. NON-VASCULAR Hepatobiliary: Cholecystectomy. No biliary dilatation. Question mild surface nodularity of the liver Pancreas: Unremarkable. No pancreatic ductal dilatation or surrounding inflammatory changes. Spleen: Normal in size without focal abnormality. Adrenals/Urinary Tract: Adrenal glands are normal. Kidneys show no hydronephrosis. Cyst mid to upper pole left kidney for which no imaging follow-up is recommended. The bladder is obscured by artifact Stomach/Bowel: Stomach nonenlarged. No dilated small bowel. No acute bowel wall thickening. Diverticular disease of the sigmoid colon. Negative appendix Lymphatic: No suspicious lymph nodes. Reproductive: Largely obscured by artifact.  No obvious adnexal mass Other: Negative for pelvic effusion or free air Musculoskeletal: Generalized subcutaneous edema. Bilateral hip replacements with artifact. Posterior spinal fusion hardware at L4-L5. Sacral Tarlov cysts. Review of the MIP images confirms the above findings. IMPRESSION: 1. Negative for acute aortic dissection or aneurysm. No significant vascular disease within the abdomen or pelvis 2. Small left and moderate right pleural effusions. Cardiomegaly with diffuse hazy pulmonary densities suspected to represent edema 3. Question surface nodularity of the liver as may be seen with cirrhosis, correlate for risk factors 4. No CT evidence for acute intra-abdominal or pelvic abnormality 5. Generalized subcutaneous edema consistent with anasarca 6. Diverticular disease of the  sigmoid colon without acute inflammation Electronically Signed   By: Jasmine Pang M.D.   On: 10/31/2023 18:30   DG Chest Portable 1 View Result Date: 10/31/2023 CLINICAL DATA:  Altered mental status EXAM: PORTABLE CHEST 1 VIEW COMPARISON:  10/01/2023 FINDINGS: Cardiomegaly with vascular congestion and mild diffuse interstitial opacity suspicious for edema. Probable small pleural effusions. No pneumothorax IMPRESSION: Cardiomegaly with vascular congestion and mild interstitial edema. Probable small effusions Electronically Signed   By: Jasmine Pang M.D.   On: 10/31/2023 16:07   CT HEAD WO CONTRAST Result Date: 10/31/2023 CLINICAL DATA:  Neuro deficit.  Evaluate for acute stroke. EXAM: CT HEAD WITHOUT CONTRAST TECHNIQUE: Contiguous axial images were obtained from the base of the skull through the vertex without intravenous contrast. RADIATION DOSE REDUCTION: This exam was performed according to the departmental dose-optimization program which includes automated exposure control, adjustment of the mA and/or kV according to patient size and/or use of iterative reconstruction technique. COMPARISON:  None Available. FINDINGS: Brain: No evidence of acute infarction, hemorrhage, hydrocephalus, extra-axial collection or mass lesion/mass effect. Vascular: No hyperdense vessel or unexpected calcification. Skull: Normal. Negative for fracture or focal lesion. Sinuses/Orbits: There is opacification of the visualized portions of the left maxillary sinus. The remaining paranasal sinuses are clear. Other: None. IMPRESSION: 1. No acute intracranial abnormalities. 2.  Left maxillary sinus disease. Electronically Signed   By: Signa Kell M.D.   On: 10/31/2023 15:49       Time spent: 50 min     Sunnie Nielsen, DO [facility] 11/03/2023, 4:17 PM    Dictation software may have been used to generate the above note. Typos may occur and escape review in typed/dictated notes. Please contact Dr Lyn Hollingshead  directly for clarity if needed.  Staff may message me via secure chat in Epic  but this may not receive an immediate response,  please page me for urgent matters!  If 7PM-7AM, please contact night coverage [email or web address]

## 2023-11-03 NOTE — Plan of Care (Signed)
  Problem: Clinical Measurements: Goal: Diagnostic test results will improve Outcome: Progressing   Problem: Clinical Measurements: Goal: Respiratory complications will improve Outcome: Progressing   Problem: Clinical Measurements: Goal: Cardiovascular complication will be avoided Outcome: Progressing   Problem: Activity: Goal: Risk for activity intolerance will decrease Outcome: Progressing   Problem: Elimination: Goal: Will not experience complications related to bowel motility Outcome: Progressing

## 2023-11-03 NOTE — Evaluation (Signed)
 Occupational Therapy Evaluation Patient Details Name: Andrea Griffin MRN: [id number] DOB: 05/04/1932 Today's Date: 11/03/2023   History of Present Illness   Pt is an 89 year old female admitted with  acute decompensation of congestive heart failure requiring admission, right pleural effusion, possible TIA     PMH significant for [place], HLD, hyperthyroidism, nonobstructive CAD on cath November 2024, paroxysmal A-fib and left atrial appendage thrombus on Coumadin, cardiomyopathy (EF 40 to 45% on TEE 09/2023     Clinical Impressions Chart reviewed, pt greeted in bed, alert and oriented x4, agreeable to OT evaluation. PTA Pt is generally MOD I in ADL, has assist for IADL from grand daughter who reports she is planning to move in with them for awhile. Pt amb with no AD at baseline, has needed PRN use of SPC recently. Pt presents with deficits in strength, endurance, activity tolerance, balance affecting safe and optimal ADL Completion.  Please refer to additional details below. Pt is preforming ADL below baseline, will benefit from acute OT to address deficits and to facilitate optimal ADL performance. OT will follow acutely.      If plan is discharge home, recommend the following:   A little help with walking and/or transfers;A little help with bathing/dressing/bathroom;Assist for transportation;Help with stairs or ramp for entrance;Assistance with cooking/housework     Functional Status Assessment   Patient has had a recent decline in their functional status and demonstrates the ability to make significant improvements in function in a reasonable and predictable amount of time.     Equipment Recommendations   Wheelchair (measurements OT)     Recommendations for Other Services         Precautions/Restrictions   Precautions Precautions: Fall Recall of Precautions/Restrictions: Intact     Mobility Bed Mobility Overal bed mobility: Needs Assistance Bed Mobility: Sit to Supine,  Supine to Sit     Supine to sit: Supervision, HOB elevated Sit to supine: Supervision, HOB elevated        Transfers Overall transfer level: Needs assistance Equipment used: Rolling walker (2 wheels), 1 person hand held assist Transfers: Sit to/from Stand Sit to Stand: Contact guard assist, Min assist           General transfer comment: minA for handheld assist, CGA with RW      Balance Overall balance assessment: Needs assistance Sitting-balance support: Feet supported Sitting balance-Leahy Scale: Fair     Standing balance support: During functional activity, Bilateral upper extremity supported, Reliant on assistive device for balance Standing balance-Leahy Scale: Fair                             ADL either performed or assessed with clinical judgement   ADL Overall ADL's : Needs assistance/impaired     Grooming: Sitting;Set up               Lower Body Dressing: Moderate assistance Lower Body Dressing Details (indicate cue type and reason): anticipate for underwear, MAX A for socks Toilet Transfer: Contact guard assist;Minimal assistance;Ambulation Toilet Transfer Details (indicate cue type and reason): simulated         Functional mobility during ADLs: Contact guard assist;Minimal assistance;Rolling walker (2 wheels) (approx 50'; MIN A via HHA approx 10')       Vision         Perception         Praxis         Pertinent Vitals/Pain Pain Assessment Pain  Assessment: No/denies pain     Extremity/Trunk Assessment Upper Extremity Assessment Upper Extremity Assessment: Generalized weakness   Lower Extremity Assessment Lower Extremity Assessment: Generalized weakness   Cervical / Trunk Assessment Cervical / Trunk Assessment: Normal   Communication Communication Communication: No apparent difficulties   Cognition Arousal: Alert Behavior During Therapy: WFL for tasks assessed/performed Cognition: No apparent impairments                                Following commands: Intact       Cueing  General Comments   Cueing Techniques: Verbal cues  spo2 >90% on RA throughout; pt placed back on 1L via [place] as found priot to OT evaluation   Exercises Other Exercises Other Exercises: edu pt, husband, grand daugther re: role of OT, role of rehab, discharge recommendations, DME use for safe ADL completion   Shoulder Instructions      Home Living Family/patient expects to be discharged to:: Private residence Living Arrangements: Spouse/significant other Available Help at Discharge: Family (sunday/wednesday all day, drives to dr appts) Type of Home: House Home Access: Stairs to enter [employer] of Steps: 3 Entrance Stairs-Rails: Left Home Layout: One level     Bathroom Shower/Tub: Tub/shower unit         Home Equipment: Agricultural consultant (2 wheels);Rollator (4 wheels);Cane - single point;BSC/3in1;Grab bars - toilet;Shower seat;Cane - quad;Hand held shower head          Prior Functioning/Environment Prior Level of Function : Independent/Modified Independent;Needs assist             Mobility Comments: amb with SPC PRN home/community distances ADLs Comments: grand daugther does medicines, laundry, cleaning, driving (they will drive PRN)    OT Problem List: Decreased strength;Decreased activity tolerance;Decreased knowledge of use of DME or AE;Decreased safety awareness;Impaired balance (sitting and/or standing)   OT Treatment/Interventions: Self-care/ADL training;DME and/or AE instruction;Therapeutic activities;Balance training;Therapeutic exercise;Patient/family education;Energy conservation      OT Goals(Current goals can be found in the care plan section)   Acute Rehab OT Goals Patient Stated Goal: go home OT Goal Formulation: With patient/family Time For Goal Achievement: 11/17/23 Potential to Achieve Goals: Good ADL Goals Pt Will Perform Grooming: with  supervision;sitting Pt Will Perform Lower Body Dressing: with supervision;sitting/lateral leans;sit to/from stand Pt Will Transfer to Toilet: with supervision;ambulating Pt Will Perform Toileting - Clothing Manipulation and hygiene: with supervision;sitting/lateral leans;sit to/from stand   OT Frequency:  Min 1X/week    Co-evaluation PT/OT/SLP Co-Evaluation/Treatment: Yes Reason for Co-Treatment: For patient/therapist safety;To address functional/ADL transfers (tolerance)          AM-PAC OT "6 Clicks" Daily Activity     Outcome Measure Help from another person eating meals?: None Help from another person taking care of personal grooming?: None Help from another person toileting, which includes using toliet, bedpan, or urinal?: A Little Help from another person bathing (including washing, rinsing, drying)?: A Lot Help from another person to put on and taking off regular upper body clothing?: A Little Help from another person to put on and taking off regular lower body clothing?: A Lot 6 Click Score: 18   End of Session Equipment Utilized During Treatment: Gait belt;Rolling walker (2 wheels) Nurse Communication: Mobility status  Activity Tolerance: Patient tolerated treatment well Patient left: in bed;with call bell/phone within reach;with bed alarm set  OT Visit Diagnosis: Other abnormalities of gait and mobility (R26.89);Muscle weakness (generalized) (M62.81)  Time: 8119-1478 OT Time Calculation (min): 21 min Charges:  OT General Charges $OT Visit: 1 Visit OT Evaluation $OT Eval Moderate Complexity: 1 Mod  Oleta Mouse, OTD OTR/L  11/03/23, 4:22 PM

## 2023-11-03 NOTE — Telephone Encounter (Signed)
 Pharmacy Patient Advocate Encounter  Insurance verification completed.    The patient is insured through [employer]. Patient has Medicare and is not eligible for a copay card, but may be able to apply for patient assistance or Medicare RX Payment Plan (Patient Must reach out to their plan, if eligible for payment plan), if available.    Ran test claim for entresto 24-26 and the current 30 day co-pay is $4.00.   This test claim was processed through [facility]- copay amounts may vary at other pharmacies due to pharmacy/plan contracts, or as the patient moves through the different stages of their insurance plan.

## 2023-11-03 NOTE — Progress Notes (Signed)
 [facility] CLINIC CARDIOLOGY PROGRESS NOTE       Patient ID: Andrea Griffin MRN: [id number] DOB/AGE: 03/21/1932 89 y.o.  Admit date: 10/31/2023 Referring Physician Dr. Lindajo Royal Primary Physician Marisue Ivan, MD  Primary Cardiologist Dr. Corky Sing Reason for Consultation AoCHF  HPI: Andrea Griffin is a 89 y.o. female  with a past medical history of persistent atrial fibrillation, hypertension, hyperlipidemia, hypothyroidism who presented to the ED on 10/31/2023 for shortness of breath.  BNP found to be elevated.  Cardiology was consulted for further evaluation.   Interval history: -Patient seen and examined this morning, resting comfortably in hospital bed. -Continues to diurese well, renal function stable.  Weaned to 1 L nasal cannula this morning. -Shortness of breath slightly improved.  Denies any chest pain symptoms. -BP and heart rate remained stable.  Review of systems complete and found to be negative unless listed above    Past Medical History:  Diagnosis Date   Anxiety    Arthritis    Asthma    Cancer (HCC)    melanoma skin cancer on head   Diastolic dysfunction    a. 05/2023 Echo: EF 70-76^, no rwma, GrII DD, nl RV fxn, mildly dil RA, mod dil LA. Mild MR. Mod AI.   GERD (gastroesophageal reflux disease)    History of recurrent cystitis    Hyperlipidemia    Hypertension    Infection of prosthetic hip joint (HCC)    Mitral regurgitation    Moderate aortic insufficiency    Nocturia    Obesity    Osteopenia    Persistent atrial fibrillation (HCC)    Thyroid disease    Urinary frequency    Urinary incontinence    Urinary urgency    Wears glasses     Past Surgical History:  Procedure Laterality Date   ABDOMINAL HYSTERECTOMY     BACK SURGERY  01/21/2016   lumbar fusion   bladder stem dilation     BREAST BIOPSY Right    x2   BREAST BIOPSY Right    x2   CARDIOVERSION N/A 08/09/2023   Procedure: CARDIOVERSION;  Surgeon: Kathryne Gin, MD;  Location:  [facility] ORS;  Service: Cardiovascular;  Laterality: N/A;   CARDIOVERSION N/A 09/20/2023   Procedure: CARDIOVERSION;  Surgeon: Kathryne Gin, MD;  Location: [facility] ORS;  Service: Cardiovascular;  Laterality: N/A;   CHOLECYSTECTOMY     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 Rosita Kea   INCISION AND DRAINAGE HIP Right 09/15/2012   INCISION AND DRAINAGE HIP Right 2013   Dr. Rosita Kea   LEFT HEART CATH AND CORONARY ANGIOGRAPHY N/A 07/16/2023   Procedure: LEFT HEART CATH AND CORONARY ANGIOGRAPHY;  Surgeon: Iran Ouch, MD;  Location: [facility] INVASIVE CV LAB;  Service: Cardiovascular;  Laterality: N/A;   LUMBAR FUSION  01/21/2016   L4-L5 by Dr. Danielle Dess   MELANOMA EXCISION     skin cancer on top of head   OPEN REDUCTION INTERNAL FIXATION (ORIF) DISTAL RADIAL FRACTURE Right 10/05/2019   Procedure: OPEN REDUCTION INTERNAL FIXATION (ORIF) DISTAL RADIAL FRACTURE;  Surgeon: Kennedy Bucker, MD;  Location: [facility] ORS;  Service: Orthopedics;  Laterality: Right;   TEE WITHOUT CARDIOVERSION N/A 08/09/2023   Procedure: TRANSESOPHAGEAL ECHOCARDIOGRAM (TEE);  Surgeon: Alluri, Meryl Dare, MD;  Location: [facility] ORS;  Service: Cardiovascular;  Laterality: N/A;   TEE WITHOUT CARDIOVERSION N/A 09/20/2023   Procedure: TRANSESOPHAGEAL ECHOCARDIOGRAM (TEE);  Surgeon:  Kathryne Gin, MD;  Location: [facility] ORS;  Service: Cardiovascular;  Laterality: N/A;    Medications Prior to Admission  Medication Sig Dispense Refill Last Dose/Taking   acetaminophen (TYLENOL) 500 MG tablet Take 1,000 mg by mouth 2 (two) times daily.   10/31/2023 Morning   amoxicillin (AMOXIL) 500 MG capsule Take 500 mg by mouth 2 (two) times daily.   10/31/2023 Morning   atorvastatin (LIPITOR) 40 MG tablet Take 1 tablet (40 mg total) by mouth daily. 30 tablet 0 10/31/2023 Morning   Calcium Carb-Cholecalciferol (CALCIUM 600+D) 600-800 MG-UNIT TABS Take 2 tablets by mouth  in the morning.   10/31/2023 Morning   escitalopram (LEXAPRO) 5 MG tablet Take 5 mg by mouth daily.   10/30/2023 at  8:30 PM   fexofenadine (ALLEGRA) 180 MG tablet Take 180 mg by mouth daily.   10/31/2023 Morning   fluticasone (FLONASE) 50 MCG/ACT nasal spray Place 2 sprays into both nostrils daily.   10/30/2023 Bedtime   Fluticasone-Salmeterol (ADVAIR) 100-50 MCG/DOSE AEPB Inhale 1 puff into the lungs every 12 (twelve) hours.   10/31/2023 Morning   Lidocaine (BLUE-EMU PAIN RELIEF DRY EX) Apply 1 application  topically 2 (two) times daily as needed (Back pain).   10/30/2023 Evening   losartan (COZAAR) 25 MG tablet Take 1 tablet by mouth daily.   10/31/2023 Morning   methimazole (TAPAZOLE) 5 MG tablet Take 2.5 mg by mouth daily.   10/31/2023 Morning   metoprolol succinate (TOPROL-XL) 100 MG 24 hr tablet Take 1 tablet (100 mg total) by mouth 2 (two) times daily. Take with or immediately following a meal. (Patient taking differently: Take 100 mg by mouth daily. 100 mg + 25 mg=125 mg Take with or immediately following a meal. daily) 60 tablet 0 10/30/2023 Noon   metoprolol succinate (TOPROL-XL) 25 MG 24 hr tablet Take 25 mg by mouth See admin instructions. 25 mg + 100 mg=125 mg daily   10/30/2023 Noon   omeprazole (PRILOSEC) 20 MG capsule Take 20 mg by mouth 2 (two) times daily.    10/31/2023 Morning   senna-docusate (SENOKOT-S) 8.6-50 MG tablet Take 1 tablet by mouth daily as needed for mild constipation.   10/31/2023 Morning   warfarin (COUMADIN) 2.5 MG tablet Take 2.5-5 mg by mouth every evening. Take 2 tablets on Monday, Wednesday, Friday and Sunday. Take 1 tablet on Tuesday, Thursday and Saturday. For INR between 2.5 and 3.   10/30/2023 at  8:30 PM   apixaban (ELIQUIS) 5 MG TABS tablet Take 1 tablet (5 mg total) by mouth 2 (two) times daily. (Patient not taking: Reported on 10/31/2023) 60 tablet 0 Not Taking   ondansetron (ZOFRAN-ODT) 4 MG disintegrating tablet Take 1 tablet (4 mg total) by mouth every 8 (eight) hours as  needed for nausea or vomiting. (Patient not taking: Reported on 10/31/2023) 12 tablet 0 Not Taking   Social History   Socioeconomic History   Marital status: Married    Spouse name: Not on file   Number of children: Not on file   Years of education: Not on file   Highest education level: Not on file  Occupational History   Not on file  Tobacco Use   Smoking status: Never   Smokeless tobacco: Never  Vaping Use   Vaping status: Never Used  Substance and Sexual Activity   Alcohol use: No   Drug use: No   Sexual activity: Not Currently  Other Topics Concern   Not on file  Social History Narrative  Lives with husband, Andrea Griffin.    Social Drivers of Corporate investment banker Strain: Low Risk  (07/14/2023)   Received from [facility]   Overall Financial Resource Strain (CARDIA)    Difficulty of Paying Living Expenses: Not hard at all  Food Insecurity: No Food Insecurity (11/02/2023)   Hunger Vital Sign    Worried About Running Out of Food in the Last Year: Never true    Ran Out of Food in the Last Year: Never true  Transportation Needs: No Transportation Needs (11/02/2023)   PRAPARE - Administrator, Civil Service (Medical): No    Lack of Transportation (Non-Medical): No  Physical Activity: Not on file  Stress: Not on file  Social Connections: Socially Integrated (11/02/2023)   Social Connection and Isolation Panel [NHANES]    Frequency of Communication with Friends and Family: Twice a week    Frequency of Social Gatherings with Friends and Family: More than three times a week    Attends Religious Services: 1 to 4 times per year    Active Member of Clubs or Organizations: No    Attends Banker Meetings: 1 to 4 times per year    Marital Status: Married  Catering manager Violence: Not At Risk (11/02/2023)   Humiliation, Afraid, Rape, and Kick questionnaire    Fear of Current or Ex-Partner: No    Emotionally Abused: No    Physically Abused:  No    Sexually Abused: No    Family History  Problem Relation Age of Onset   Stroke Mother      Vitals:   11/03/23 0427 11/03/23 0457 11/03/23 0500 11/03/23 0751  BP: 131/80   (!) 147/61  Pulse: 74   82  Resp: 18   19  Temp: 98 F (36.7 C)   98 F (36.7 C)  TempSrc:      SpO2: 93%   94%  Weight:  63.1 kg 63.3 kg   Height:        PHYSICAL EXAM General: Chronically ill-appearing elderly female, well nourished, in no acute distress. HEENT: Normocephalic and atraumatic. Neck: No JVD.  Lungs: Normal respiratory effort on 1L [place].  Lungs CTAB Heart: Irregularly irregular, controlled rate. Normal S1 and S2 without gallops or murmurs.  Abdomen: Non-distended appearing.  Msk: Normal strength and tone for age. Extremities: Warm and well perfused. No clubbing, cyanosis.  No edema.  Neuro: Alert and oriented X 3. Psych: Answers questions appropriately.   Labs: Basic Metabolic Panel: Recent Labs    11/02/23 0423 11/03/23 0436  NA 129* 128*  K 3.2* 3.8  CL 90* 92*  CO2 29 28  GLUCOSE 78 80  BUN 10 12  CREATININE 0.39* 0.42*  CALCIUM 8.6* 8.5*   Liver Function Tests: Recent Labs    10/31/23 1545  AST 29  ALT 22  ALKPHOS 79  BILITOT 1.5*  PROT 7.2  ALBUMIN 3.6   No results for input(s): "LIPASE", "AMYLASE" in the last 72 hours. CBC: Recent Labs    10/31/23 1545 11/01/23 0451 11/02/23 0423 11/03/23 0436  WBC 8.4   < > 7.7 8.0  NEUTROABS 5.7  --   --   --   HGB 12.2   < > 11.5* 11.8*  HCT 37.1   < > 34.5* 34.0*  MCV 91.8   < > 89.8 88.1  PLT 342   < > 298 304   < > = values in this interval not displayed.   Cardiac  Enzymes: Recent Labs    10/31/23 1545 10/31/23 1745  TROPONINIHS 14 14   BNP: Recent Labs    10/31/23 1545  BNP 818.6*   D-Dimer: No results for input(s): "DDIMER" in the last 72 hours. Hemoglobin A1C: Recent Labs    11/01/23 0451  HGBA1C 5.3   Fasting Lipid Panel: Recent Labs    11/01/23 0451  CHOL 85  HDL 31*  LDLCALC 41   TRIG 67  CHOLHDL 2.7   Thyroid Function Tests: No results for input(s): "TSH", "T4TOTAL", "T3FREE", "THYROIDAB" in the last 72 hours.  Invalid input(s): "FREET3" Anemia Panel: No results for input(s): "VITAMINB12", "FOLATE", "FERRITIN", "TIBC", "IRON", "RETICCTPCT" in the last 72 hours.   Radiology: MR BRAIN WO CONTRAST Result Date: 11/01/2023 CLINICAL DATA:  Initial evaluation for acute neuro deficit, stroke suspected. EXAM: MRI HEAD WITHOUT CONTRAST TECHNIQUE: Multiplanar, multiecho pulse sequences of the brain and surrounding structures were obtained without intravenous contrast. COMPARISON:  Prior CT from earlier the same day. FINDINGS: Brain: Cerebral volume within normal limits. Patchy T2/FLAIR hyperintensity involving the periventricular white matter, most characteristic of chronic microvascular ischemic disease, mild for age. No evidence for acute or subacute ischemia. No areas of chronic cortical infarction. No acute or chronic intracranial blood products. No mass lesion, midline shift or mass effect. No hydrocephalus or extra-axial fluid collection. Pituitary gland and suprasellar region within normal limits. Vascular: Major intracranial vascular flow voids are maintained. Skull and upper cervical spine: Craniocervical junction within normal limits. Bone marrow signal intensity normal. No scalp soft tissue abnormality. Sinuses/Orbits: Prior bilateral ocular lens replacement. Chronic left maxillary sinusitis noted. Paranasal sinuses are otherwise largely clear. Trace bilateral mastoid effusions noted, of doubtful significance. Other: None. IMPRESSION: 1. No acute intracranial abnormality. 2. Mild chronic microvascular ischemic disease for age. 3. Chronic left maxillary sinusitis. Electronically Signed   By: Rise Mu M.D.   On: 11/01/2023 00:04   CT Angio Chest/Abd/Pel for Dissection W and/or W/WO Result Date: 10/31/2023 CLINICAL DATA:  Difficulty speaking altered EXAM: CT  ANGIOGRAPHY CHEST, ABDOMEN AND PELVIS TECHNIQUE: Non-contrast CT of the chest was initially obtained. Multidetector CT imaging through the chest, abdomen and pelvis was performed using the standard protocol during bolus administration of intravenous contrast. Multiplanar reconstructed images and MIPs were obtained and reviewed to evaluate the vascular anatomy. RADIATION DOSE REDUCTION: This exam was performed according to the departmental dose-optimization program which includes automated exposure control, adjustment of the mA and/or kV according to patient size and/or use of iterative reconstruction technique. CONTRAST:  OMNIPAQUE IOHEXOL 350 MG/ML SOLN COMPARISON:  Chest x-ray 10/31/2023, CT 07/14/2023, 05/01/2012, chest CT 05/06/2023 FINDINGS: CTA CHEST FINDINGS Cardiovascular: Non contrasted images of the chest demonstrate no acute intramural hematoma. Negative for aortic dissection or aneurysm. Cardiomegaly with marked biatrial enlargement. No significant pericardial effusion. Mediastinum/Nodes: Patent trachea. No suspicious thyroid mass. Subcentimeter mediastinal lymph nodes. Esophagus within normal limits Lungs/Pleura: Subpleural reticulation consistent with pulmonary fibrosis. Small left and moderate right pleural effusions. Mild diffuse hazy lung attenuation likely due to edema. Stable scattered small pulmonary nodules measuring up to 4 mm at the superior left lower lobe on series 7, image 49 for which no additional imaging follow-up is recommended. Musculoskeletal: Sternum is intact.  No acute osseous abnormality Review of the MIP images confirms the above findings. CTA ABDOMEN AND PELVIS FINDINGS VASCULAR Aorta: Normal caliber aorta without aneurysm, dissection, vasculitis or significant stenosis. Celiac: Patent without evidence of aneurysm, dissection, vasculitis or significant stenosis. Left gastric artery arises directly from the  aorta, superior to the celiac trunk. SMA: Patent without evidence  of aneurysm, dissection, vasculitis or significant stenosis. Renals: Both renal arteries are patent without evidence of aneurysm, dissection, vasculitis, fibromuscular dysplasia or significant stenosis. IMA: Patent without evidence of aneurysm, dissection, vasculitis or significant stenosis. Inflow: Patent without evidence of aneurysm, dissection, vasculitis or significant stenosis. Veins: Suboptimally assessed Review of the MIP images confirms the above findings. NON-VASCULAR Hepatobiliary: Cholecystectomy. No biliary dilatation. Question mild surface nodularity of the liver Pancreas: Unremarkable. No pancreatic ductal dilatation or surrounding inflammatory changes. Spleen: Normal in size without focal abnormality. Adrenals/Urinary Tract: Adrenal glands are normal. Kidneys show no hydronephrosis. Cyst mid to upper pole left kidney for which no imaging follow-up is recommended. The bladder is obscured by artifact Stomach/Bowel: Stomach nonenlarged. No dilated small bowel. No acute bowel wall thickening. Diverticular disease of the sigmoid colon. Negative appendix Lymphatic: No suspicious lymph nodes. Reproductive: Largely obscured by artifact.  No obvious adnexal mass Other: Negative for pelvic effusion or free air Musculoskeletal: Generalized subcutaneous edema. Bilateral hip replacements with artifact. Posterior spinal fusion hardware at L4-L5. Sacral Tarlov cysts. Review of the MIP images confirms the above findings. IMPRESSION: 1. Negative for acute aortic dissection or aneurysm. No significant vascular disease within the abdomen or pelvis 2. Small left and moderate right pleural effusions. Cardiomegaly with diffuse hazy pulmonary densities suspected to represent edema 3. Question surface nodularity of the liver as may be seen with cirrhosis, correlate for risk factors 4. No CT evidence for acute intra-abdominal or pelvic abnormality 5. Generalized subcutaneous edema consistent with anasarca 6. Diverticular  disease of the sigmoid colon without acute inflammation Electronically Signed   By: Jasmine Pang M.D.   On: 10/31/2023 18:30   DG Chest Portable 1 View Result Date: 10/31/2023 CLINICAL DATA:  Altered mental status EXAM: PORTABLE CHEST 1 VIEW COMPARISON:  10/01/2023 FINDINGS: Cardiomegaly with vascular congestion and mild diffuse interstitial opacity suspicious for edema. Probable small pleural effusions. No pneumothorax IMPRESSION: Cardiomegaly with vascular congestion and mild interstitial edema. Probable small effusions Electronically Signed   By: Jasmine Pang M.D.   On: 10/31/2023 16:07   CT HEAD WO CONTRAST Result Date: 10/31/2023 CLINICAL DATA:  Neuro deficit.  Evaluate for acute stroke. EXAM: CT HEAD WITHOUT CONTRAST TECHNIQUE: Contiguous axial images were obtained from the base of the skull through the vertex without intravenous contrast. RADIATION DOSE REDUCTION: This exam was performed according to the departmental dose-optimization program which includes automated exposure control, adjustment of the mA and/or kV according to patient size and/or use of iterative reconstruction technique. COMPARISON:  None Available. FINDINGS: Brain: No evidence of acute infarction, hemorrhage, hydrocephalus, extra-axial collection or mass lesion/mass effect. Vascular: No hyperdense vessel or unexpected calcification. Skull: Normal. Negative for fracture or focal lesion. Sinuses/Orbits: There is opacification of the visualized portions of the left maxillary sinus. The remaining paranasal sinuses are clear. Other: None. IMPRESSION: 1. No acute intracranial abnormalities. 2. Left maxillary sinus disease. Electronically Signed   By: Signa Kell M.D.   On: 10/31/2023 15:49    ECHO TEE 09/20/2023: 1. Left ventricular ejection fraction, by estimation, is 40 to 45%. The  left ventricle has mild to moderately decreased function.   2. Right ventricular systolic function is moderately reduced. The right  ventricular  size is normal.   3. Left atrial size was dilated. A left atrial/left atrial appendage  thrombus was detected.   4. Right atrial size was dilated.   5. The mitral valve was not assessed.  6. The aortic valve was not assessed.   TELEMETRY reviewed by me 11/03/2023: Atrial fibrillation rate 70s  EKG reviewed by me: Atrial fibrillation rate 86 bpm  Data reviewed by me 11/03/2023: last 24h vitals tele labs imaging I/O hospitalist progress note  Principal Problem:   Acute on chronic HFrEF (heart failure with reduced ejection fraction) (HCC) Active Problems:   Hyponatremia   Essential hypertension   Hyperthyroidism   NICM , EF 40-45% 09/2023(nonischemic cardiomyopathy) (HCC)   Persistent atrial fibrillation (HCC)   Warfarin anticoagulation   Thrombus of left atrial appendage   Frailty/ physical deconditioning   Generalized weakness   Nonobstructive CAD on LHC 07/2023   Slurred speech   CHF exacerbation (HCC)    ASSESSMENT AND PLAN:  JAMIYA NIMS is a 89 y.o. female  with a past medical history of persistent atrial fibrillation, hypertension, hyperlipidemia, hypothyroidism who presented to the ED on 10/31/2023 for shortness of breath.  BNP found to be elevated.  Cardiology was consulted for further evaluation.   # Acute on chronic HFrEF # Persistent atrial fibrillation Patient presented to the ED with complaints of worsening shortness of breath.  BNP elevated at 818.  Troponins normal x 2.  Granddaughter also reports that she was having slurred speech, CT and MRI negative for acute stroke.  Recently was diagnosed with atrial fibrillation and cardioversion was attempted twice however these were both deferred as she has left atrial appendage thrombus. -Continue IV Lasix 20 mg twice daily.  Will likely plan to transition to p.o. tomorrow.  She was not taking any p.o. diuretic prior to her admission. -Stop losartan and start Entresto 24-26 mg twice daily.  Continue spironolactone 12.5 mg  daily, metoprolol succinate 125 mg daily.  Patient would not be a good candidate for SGLT2 inhibitor as she has frequent UTIs. -Continue warfarin as per pharmacy. -No plan for further cardiac diagnostics at this time.   This patient's plan of care was discussed and created with Dr. Juliann Pares and he is in agreement.  Signed: Gale Journey, PA-C  11/03/2023, 9:02 AM [facility]

## 2023-11-03 NOTE — Progress Notes (Addendum)
 PHARMACY - ANTICOAGULATION CONSULT NOTE  Pharmacy Consult for warfarin and enoxaparin Indication: atrial fibrillation and atrial thrombus  Allergies  Allergen Reactions   Ciprofloxacin Hives   Adhesive [Tape]     Pulls off skin   Nitrofurantoin Other (See Comments)    Can not remember reaction   Phenazopyridine Other (See Comments)    Pt doesn't remember   Ranitidine Other (See Comments)    Can not remember reaction   Sulfa Antibiotics Other (See Comments)    Can not remember reaction    Patient Measurements: Height: 5\' 1"  (154.9 cm) Weight: 63.3 kg (139 lb 8.8 oz) IBW/kg (Calculated) : 47.8  Vital Signs: Temp: 98 F (36.7 C) (03/05 0751) Temp Source: Oral (03/04 2347) BP: 147/61 (03/05 0751) Pulse Rate: 82 (03/05 0751)  Labs: Recent Labs    10/31/23 1545 10/31/23 1745 11/01/23 0451 11/02/23 0423 11/03/23 0436  HGB 12.2  --  10.9* 11.5* 11.8*  HCT 37.1  --  32.9* 34.5* 34.0*  PLT 342  --  274 298 304  APTT 47*  --   --   --   --   LABPROT 22.0*  --  22.2* 26.3* 27.9*  INR 1.9*  --  1.9* 2.4* 2.6*  CREATININE 0.45  --  0.56 0.39* 0.42*  TROPONINIHS 14 14  --   --   --     Estimated Creatinine Clearance: 40.6 mL/min (A) (by C-G formula based on SCr of 0.42 mg/dL (L)).   Medical History: Past Medical History:  Diagnosis Date   Anxiety    Arthritis    Asthma    Cancer (HCC)    melanoma skin cancer on head   Diastolic dysfunction    a. 05/2023 Echo: EF 70-76^, no rwma, GrII DD, nl RV fxn, mildly dil RA, mod dil LA. Mild MR. Mod AI.   GERD (gastroesophageal reflux disease)    History of recurrent cystitis    Hyperlipidemia    Hypertension    Infection of prosthetic hip joint (HCC)    Mitral regurgitation    Moderate aortic insufficiency    Nocturia    Obesity    Osteopenia    Persistent atrial fibrillation (HCC)    Thyroid disease    Urinary frequency    Urinary incontinence    Urinary urgency    Wears glasses    Assessment: 89 y/o female  presenting with slurred speech. PMH significant for afib and left atrial thrombus (seen on ECHO from 09/2023) on warfarin PTA (recently started in 09/2023), HTN, HLD, and asthma. Pharmacy has been consulted to resume PTA warfarin.  Warfarin regimen: 2.5 mg on Tue/Thurs/Sat and 5 mg on Mon, Wed, Fri, and Sun (Total weekly dose: 27.5) Last dose: 2.5 mg on 10/30/23   Goal of Therapy:  INR 2.0-3.0 for afib , targeting 2.5-3.0   (per granddaughter, cardiologist at [facility] is targeting higher end of range due to left atrial thrombus) Monitor platelets by anticoagulation protocol: Yes   Date INR Warfarin Dose Comments  3/2 1.9 6mg  Admitted with slurred speech and AMS (possible TIA)  3/3 1.9 7.5 mg Start enoxaparin 1mg /kg q 12hours until INR >/=2.5  3/4 2.4 2.5 mg   3/5 2.6 5 mg    Plan:  INR is therapeutic. Will give warfarin 5 mg x 1 (home dose). Will stop enoxaparin after today's dose. Daily INR. CBC at least every 3 days.   Paschal Dopp, PharmD, BCPS 11/03/2023 8:06 AM

## 2023-11-03 NOTE — Progress Notes (Signed)
 Heart Failure Stewardship Pharmacy Note  PCP: Marisue Ivan, MD PCP-Cardiologist: Julien Nordmann, MD  HPI: Andrea Griffin is a 89 y.o. female with HTN, HLD, hyperthyroidism, nonobstructive CAD on cath November 2024, paroxysmal A-fib and left atrial appendage thrombus on Coumadin, cardiomyopathy (EF 40 to 45% on TEE 09/2023) who presented with slurred speech, weakness, and malaise. CT head and MR brain both negative for acute intracranial abnormality. CTA chest, abdomen, pelvis was negative for dissection/aneurysm, small left + moderate right effusion, and suspected edema. On admission, BNP was 818.6, HS-troponin was 14.. Chest x-ray noted vascular congestion with mild interstitial edema.    Pertinent cardiac history: Echo 05/2023 with LVEF 70-75%, grade II diastolic function. LHC 07/2023 with minimal CAD. Echo 07/2023 with LVEF 55-60%. Mild LVH. TEE 09/2023 with LVEF 40-45% with evidence of LA thrombus.  Pertinent Lab Values: Creatinine  Date Value Ref Range Status  03/28/2014 0.95 0.60 - 1.30 mg/dL Final   Creatinine, Ser  Date Value Ref Range Status  11/03/2023 0.42 (L) 0.44 - 1.00 mg/dL Final   BUN  Date Value Ref Range Status  11/03/2023 12 8 - 23 mg/dL Final  16/06/9603 13 7 - 18 mg/dL Final   Potassium  Date Value Ref Range Status  11/03/2023 3.8 3.5 - 5.1 mmol/L Final  03/28/2014 4.2 3.5 - 5.1 mmol/L Final   Sodium  Date Value Ref Range Status  11/03/2023 128 (L) 135 - 145 mmol/L Final  03/28/2014 137 136 - 145 mmol/L Final   B Natriuretic Peptide  Date Value Ref Range Status  10/31/2023 818.6 (H) 0.0 - 100.0 pg/mL Final    Comment:    Performed at [facility], [address] [place], [place] [address]   Magnesium  Date Value Ref Range Status  09/30/2023 1.5 (L) 1.7 - 2.4 mg/dL Final    Comment:    Performed at [facility], [address] [place], [place] [address]  05/03/2012 1.3 (L) mg/dL Final    Comment:    7.8-2.9 THERAPEUTIC  RANGE: 4-7 mg/dL TOXIC: > 10 mg/dL  -----------------------    Hgb A1c MFr Bld  Date Value Ref Range Status  11/01/2023 5.3 4.8 - 5.6 % Final    Comment:    (NOTE) Pre diabetes:          5.7%-6.4%  Diabetes:              >6.4%  Glycemic control for   <7.0% adults with diabetes    TSH  Date Value Ref Range Status  11/03/2023 2.236 0.350 - 4.500 uIU/mL Final    Comment:    Performed by a 3rd Generation assay with a functional sensitivity of <=0.01 uIU/mL. Performed at [facility], [address] [place], [place] [address]     Vital Signs:  Temp:  [98 F (36.7 C)-98.7 F (37.1 C)] 98 F (36.7 C) (03/05 0751) Pulse Rate:  [50-82] 82 (03/05 0751) Cardiac Rhythm: Atrial fibrillation;Bundle branch block (03/05 0815) Resp:  [17-19] 19 (03/05 0751) BP: (112-147)/(54-82) 147/61 (03/05 0751) SpO2:  [93 %-98 %] 94 % (03/05 0751) Weight:  [63.1 kg (139 lb 1.8 oz)-63.3 kg (139 lb 8.8 oz)] 63.3 kg (139 lb 8.8 oz) (03/05 0500)  Intake/Output Summary (Last 24 hours) at 11/03/2023 1016 Last data filed at 11/03/2023 0400 Gross per 24 hour  Intake 120 ml  Output 1050 ml  Net -930 ml    Current Heart Failure Medications:  Loop diuretic: furosemide 20 mg IV q12h Beta-Blocker: metoprolol succinate  125 mg daily ACEI/ARB/ARNI: Entresto 24-26 mg BID MRA: spironolactone 12.5 mg daily SGLT2i: none  Prior to admission Heart Failure Medications:  Loop diuretic: none Beta-Blocker: metoprolol succinate 125 mg daily ACEI/ARB/ARNI: losartan 25 mg daily MRA: none SGLT2i: none  Assessment: 1. Acute on chronic combined systolic and diastolic heart failure (LVEF 40-45%) with history fo grade II diastolic dysfunction, due to NICM. NYHA class III symptoms.  -Symptoms: Reports significant shortness of breath with mild exertion, though improved some from admission. Reports ankle edema. Symptoms persist despite controlled rates. -Volume: Appears to still be hypervolemic. Would benefit  from TED hose. Creatinine and BUN are stable with <2L urine output documented per day. Weight is relatively unchanged if accurate. Would consider increasing furosemide to 40 mg IV BID tomorrow if symptoms persist. -Hemodynamics: BP elevated 130-140s/60-80s. HR 70-80s. -ZO:[id number] metoprolol succinate 100 mg daily -ACEI/ARB/ARNI: Agree with transitioning to Entersto 24-26 mg BID. -MRA: Continue spironolactone 12.5 mg daily. Can consider increasing to 25 mg daily if BP and creatinine are stable tomorrow. -SGLT2i: Not a good candidate given recent UTI  Plan: 1) Medication changes recommended at this time: -Discussed with cardiology, losartan to be transitioned to [facility]  2) Patient assistance: -Entresto copay is $4  3) Education: Please do not hesitate to reach out with questions or concerns,  Enos Fling, PharmD, CPP, BCPS Heart Failure Pharmacist  Phone - [phone] 11/03/2023 10:16 AM  Medication Assistance / Insurance Benefits Check: Does the patient have prescription insurance?    Type of insurance plan:  Does the patient qualify for medication assistance through manufacturers or grants? No   Outpatient Pharmacy: Prior to admission outpatient pharmacy: [facility]   Is the patient willing to utilize a [facility] pharmacy at discharge?: Yes  Please do not hesitate to reach out with questions or concerns,  Enos Fling, PharmD, CPP, BCPS Heart Failure Pharmacist  Phone - [phone] 11/03/2023 10:16 AM

## 2023-11-03 NOTE — Hospital Course (Addendum)
 Hospital course / significant events:   HPI: Andrea Griffin is a 89 y.o. female with medical history significant for  HTN, HLD, hyperthyroidism, nonobstructive CAD on cath November 2024, paroxysmal A-fib and left atrial appendage thrombus on Coumadin, cardiomyopathy (EF 40 to 45% on TEE 09/2023) last evaluated by her cardiologist outpatient a couple weeks prior on 10/18/2023 when she had ongoing exertional dyspnea and palpitations and left chest discomfort attributed to symptomatic persistent A-fib for which GDMT was adjusted(losartan added), who was brought to the ED with a complaint of slurred speech on awaking this morning as well as a several week history of weakness, malaise and generally feeling unwell.    03/02: to ED, admitted to hospitalist w/ acute on chronic HFrEF, acute respiratory failure d/t CHF and pleural effusion, question TIA/CVA, chronic hyponatremia. CT/MRI negative for CVA. 03/03: cardiology consulted, advise continue diuresis which pt was not on prior to hospitalization, consider add spironolactone, no further diagnostics  03/04: conitnuing IV diuresis 03/05: pt c/o more SOB today, reviewed CT images which did show significant effusion, US thoracentesis ordered if enough fluid to pull based on [place], IR eval and effusion had reduced sufficiently that no thoracentesis was performed  03/06: still O2 requirement, pt is relctant for home oxygen, failed walk test today, will recheck tomorrow, if otherwise stable can likely plan on dc home w/ O2, if worse will stay here  03/07: ambulating well on room air without desaturation      Consultants:  Cardiology   Procedures/Surgeries: none      ASSESSMENT & PLAN:  Acute on chronic HFrEF (heart failure with reduced ejection fraction)  NICM, EF 40 to 45% on TEE 09/2023 Right pleural effusion/anasarca Continue po Lasix - see med rec  Monitor daily weight at home GDMT with metoprolol and losartan, spironolactone. NO SGLT2 d/t frequent  UTI Cardiologist following    Acute stroke ruled out Possible TIA MRI of the brain did not show any acute intracranial pathology PT OT eval  Nonobstructive CAD on [facility] 07/2023 Patient with occasional chest pain.  Troponin normal.  EKG nonacute Continue metoprolol, losartan, warfarin   Persistent atrial fibrillation  Thrombus left atrial appendage Warfarin anticoagulation, subtherapeutic at 1.9 on arrival, now at 2.6 Currently rate controlled with metoprolol Coumadin    Hyponatremia Chronic.  Likely hypervolemic Expecting improvement with fluid restriction and diuretics Monitor BMP   Frailty/ physical deconditioning Generalized weakness Patient with advanced age, multiple comorbidities, recent hospitalizations PT/OT home health   Hyperthyroidism Continue methimazole  Follow outpatient    Essential hypertension Continue metoprolol  Dc losartan and started entresto Lasix     DVT prophylaxis: coumadin IV fluids: no continuous IV fluids  Nutrition: cardiac diet  Central lines / invasive devices: none  Code Status: DNR ACP documentation reviewed: advanced directive from 2017 on file in [facility] however this form appears to be for an Governor Rooks NOT for this patient - was voided   TOC needs: home health  Barriers to dispo / significant pending items: O2 requirement, if able to ambulate on 2L O2 or on A tomorrow anticiapte discharge

## 2023-11-03 NOTE — Progress Notes (Signed)
 Interventional Radiology Brief Note  Patient brought to [facility] Radiology for possible thoracentesis. CT Chest from 3/2 shows a sizeable left pleural effusion.  Since this time patient has been effectively diuresed.  Limited US Chest of right and left chest shows only a trace left pleural effusion remains and is insufficient for safe thoracentesis. No procedure performed.  Patient returned to unit.   Loyce Dys, MS RD PA-C

## 2023-11-04 DIAGNOSIS — I5023 Acute on chronic systolic (congestive) heart failure: Secondary | ICD-10-CM | POA: Diagnosis not present

## 2023-11-04 LAB — PROTIME-INR
INR: 2.4 — ABNORMAL HIGH (ref 0.8–1.2)
Prothrombin Time: 26.4 s — ABNORMAL HIGH (ref 11.4–15.2)

## 2023-11-04 LAB — CBC
HCT: 36.6 % (ref 36.0–46.0)
Hemoglobin: 12.5 g/dL (ref 12.0–15.0)
MCH: 29.7 pg (ref 26.0–34.0)
MCHC: 34.2 g/dL (ref 30.0–36.0)
MCV: 86.9 fL (ref 80.0–100.0)
Platelets: 362 10*3/uL (ref 150–400)
RBC: 4.21 MIL/uL (ref 3.87–5.11)
RDW: 14.7 % (ref 11.5–15.5)
WBC: 8.8 10*3/uL (ref 4.0–10.5)
nRBC: 0 % (ref 0.0–0.2)

## 2023-11-04 LAB — BASIC METABOLIC PANEL
Anion gap: 10 (ref 5–15)
BUN: 12 mg/dL (ref 8–23)
CO2: 27 mmol/L (ref 22–32)
Calcium: 8.5 mg/dL — ABNORMAL LOW (ref 8.9–10.3)
Chloride: 91 mmol/L — ABNORMAL LOW (ref 98–111)
Creatinine, Ser: 0.43 mg/dL — ABNORMAL LOW (ref 0.44–1.00)
GFR, Estimated: >60 mL/min (ref >60)
Glucose, Bld: 95 mg/dL (ref 70–99)
Potassium: 3.5 mmol/L (ref 3.5–5.1)
Sodium: 128 mmol/L — ABNORMAL LOW (ref 135–145)

## 2023-11-04 MED ORDER — SPIRONOLACTONE 25 MG PO TABS
25.0000 mg | ORAL_TABLET | Freq: Every day | ORAL | Status: DC
  Administered 2023-11-04 – 2023-11-05 (×2): 25 mg via ORAL
  Filled 2023-11-04 (×2): qty 1

## 2023-11-04 MED ORDER — WARFARIN SODIUM 5 MG PO TABS
5.0000 mg | ORAL_TABLET | Freq: Once | ORAL | Status: AC
  Administered 2023-11-04: 5 mg via ORAL
  Filled 2023-11-04: qty 1

## 2023-11-04 MED ORDER — FUROSEMIDE 40 MG PO TABS
40.0000 mg | ORAL_TABLET | Freq: Every day | ORAL | Status: DC
  Administered 2023-11-04 – 2023-11-05 (×2): 40 mg via ORAL
  Filled 2023-11-04 (×2): qty 1

## 2023-11-04 NOTE — Plan of Care (Signed)

## 2023-11-04 NOTE — Progress Notes (Signed)
 Occupational Therapy Treatment Patient Details Name: Andrea Griffin MRN: [id number] DOB: 03-Dec-1931 Today's Date: 11/04/2023   History of present illness Pt is an 89 year old female admitted with  acute decompensation of congestive heart failure requiring admission, right pleural effusion, possible TIA     PMH significant for [place], HLD, hyperthyroidism, nonobstructive CAD on cath November 2024, paroxysmal A-fib and left atrial appendage thrombus on Coumadin, cardiomyopathy (EF 40 to 45% on TEE 09/2023   OT comments  Pt is supine in bed on arrival. Pleasant and agreeable to OT session. She is reporting new onset pain in her L hip today with nurse providing pain meds prior to OT session. Pt reports 5/10 hip pain at rest and with movement. Pt performed bed mobility with SUP to Min A for BLE management to return to bed. CGA for SPT from bed<>BSC. SUP for peri-care and CGA for clothing management. Ambulated ~20 feet in room to bathroom and back with RW with CGA despite pain. Maintained sp02 89-95% on RA throughout session. Oral care with CGA/SBA standing at sink. Pt returned to bed with all needs in place and will cont to require skilled acute OT services to maximize her safety and IND to return to PLOF.       If plan is discharge home, recommend the following:  A little help with walking and/or transfers;A little help with bathing/dressing/bathroom;Assist for transportation;Help with stairs or ramp for entrance;Assistance with Charity fundraiser (measurements OT)    Recommendations for Other Services      Precautions / Restrictions Restrictions Weight Bearing Restrictions Per Provider Order: No       Mobility Bed Mobility Overal bed mobility: Needs Assistance Bed Mobility: Sit to Supine, Supine to Sit     Supine to sit: Supervision, HOB elevated Sit to supine: Mod assist, Min assist   General bed mobility comments: Min/Mod A to return to bed for BLE  management    Transfers Overall transfer level: Needs assistance Equipment used: Rolling walker (2 wheels), 1 person hand held assist Transfers: Sit to/from Stand, Bed to chair/wheelchair/BSC Sit to Stand: Contact guard assist           General transfer comment: CGA for STS and SPT via HHA to BSC; RW use and CGA for in room mobility to bathroom     Balance Overall balance assessment: Needs assistance Sitting-balance support: Feet supported Sitting balance-Leahy Scale: Good     Standing balance support: During functional activity, Bilateral upper extremity supported, Reliant on assistive device for balance Standing balance-Leahy Scale: Fair Standing balance comment: RW use                           ADL either performed or assessed with clinical judgement   ADL Overall ADL's : Needs assistance/impaired     Grooming: Oral care;Standing;Supervision/safety;Contact guard assist Grooming Details (indicate cue type and reason): in bathroom at sink                 Toilet Transfer: Contact guard assist;BSC/3in1 Statistician Details (indicate cue type and reason): SPT to [facility] and back to bed Toileting- Clothing Manipulation and Hygiene: Supervision/safety;Sitting/lateral lean;Sit to/from stand;Contact guard assist Toileting - Clothing Manipulation Details (indicate cue type and reason): peri-care SUP seated; CGA for clothing management to pull up pull-ups            Extremity/Trunk Assessment  Vision       Perception     Praxis     Communication Communication Communication: No apparent difficulties   Cognition Arousal: Alert Behavior During Therapy: WFL for tasks assessed/performed Cognition: No apparent impairments                               Following commands: Intact        Cueing   Cueing Techniques: Verbal cues  Exercises      Shoulder Instructions       General Comments sp02 89-97% on RA  throughout session    Pertinent Vitals/ Pain       Pain Assessment Pain Assessment: 0-10 Pain Score: 5  Pain Location: L hip pain Pain Descriptors / Indicators: Aching, Sore Pain Intervention(s): Monitored during session, Repositioned, Premedicated before session  Home Living                                          Prior Functioning/Environment              Frequency  Min 2X/week        Progress Toward Goals  OT Goals(current goals can now be found in the care plan section)  Progress towards OT goals: Progressing toward goals  Acute Rehab OT Goals Patient Stated Goal: go home OT Goal Formulation: With patient/family Time For Goal Achievement: 11/17/23 Potential to Achieve Goals: Good  Plan      Co-evaluation                 AM-PAC OT "6 Clicks" Daily Activity     Outcome Measure   Help from another person eating meals?: None Help from another person taking care of personal grooming?: None Help from another person toileting, which includes using toliet, bedpan, or urinal?: A Little Help from another person bathing (including washing, rinsing, drying)?: A Lot Help from another person to put on and taking off regular upper body clothing?: A Little Help from another person to put on and taking off regular lower body clothing?: A Lot 6 Click Score: 18    End of Session Equipment Utilized During Treatment: Gait belt;Rolling walker (2 wheels)  OT Visit Diagnosis: Other abnormalities of gait and mobility (R26.89);Muscle weakness (generalized) (M62.81)   Activity Tolerance Patient tolerated treatment well   Patient Left in bed;with call bell/phone within reach;with bed alarm set;with family/visitor present   Nurse Communication Mobility status        Time: 1610-9604 OT Time Calculation (min): 27 min  Charges: OT General Charges $OT Visit: 1 Visit OT Treatments $Self Care/Home Management : 23-37 mins  Hortensia Duffin,  OTR/L  11/04/23, 4:30 PM   Constance Goltz 11/04/2023, 4:28 PM

## 2023-11-04 NOTE — Care Management Important Message (Signed)
 Important Message  Patient Details  Name: Andrea Griffin MRN: [id number] Date of Birth: September 03, 1931   Important Message Given:  Yes - Medicare IM     Cristela Blue, CMA 11/04/2023, 10:36 AM

## 2023-11-04 NOTE — Progress Notes (Signed)
 SATURATION QUALIFICATIONS: (This note is used to comply with regulatory documentation for home oxygen)  Patient Saturations on Room Air at Rest = 93%  Patient Saturations on Room Air while Ambulating = 87%  Patient Saturations on 2 Liters of oxygen while Ambulating = 94%  Please briefly explain why patient needs home oxygen:  Patient has O2 desaturation and experiencing shortnesss of breath when ambulating.

## 2023-11-04 NOTE — Progress Notes (Signed)
 Heart Failure Stewardship Pharmacy Note  PCP: Marisue Ivan, MD PCP-Cardiologist: Julien Nordmann, MD  HPI: Andrea Griffin is a 89 y.o. female with HTN, HLD, hyperthyroidism, nonobstructive CAD on cath November 2024, paroxysmal A-fib and left atrial appendage thrombus on Coumadin, cardiomyopathy (EF 40 to 45% on TEE 09/2023) who presented with slurred speech, weakness, and malaise. CT head and MR brain both negative for acute intracranial abnormality. CTA chest, abdomen, pelvis was negative for dissection/aneurysm, small left + moderate right effusion, and suspected edema. On admission, BNP was 818.6, HS-troponin was 14.. Chest x-ray noted vascular congestion with mild interstitial edema.    Pertinent cardiac history: Echo 05/2023 with LVEF 70-75%, grade II diastolic function. LHC 07/2023 with minimal CAD. Echo 07/2023 with LVEF 55-60%. Mild LVH. TEE 09/2023 with LVEF 40-45% with evidence of LA thrombus.  Pertinent Lab Values: Creatinine  Date Value Ref Range Status  03/28/2014 0.95 0.60 - 1.30 mg/dL Final   Creatinine, Ser  Date Value Ref Range Status  11/04/2023 0.43 (L) 0.44 - 1.00 mg/dL Final   BUN  Date Value Ref Range Status  11/04/2023 12 8 - 23 mg/dL Final  14/78/2956 13 7 - 18 mg/dL Final   Potassium  Date Value Ref Range Status  11/04/2023 3.5 3.5 - 5.1 mmol/L Final  03/28/2014 4.2 3.5 - 5.1 mmol/L Final   Sodium  Date Value Ref Range Status  11/04/2023 128 (L) 135 - 145 mmol/L Final  03/28/2014 137 136 - 145 mmol/L Final   B Natriuretic Peptide  Date Value Ref Range Status  10/31/2023 818.6 (H) 0.0 - 100.0 pg/mL Final    Comment:    Performed at [facility], [address] [place], [place] [address]   Magnesium  Date Value Ref Range Status  09/30/2023 1.5 (L) 1.7 - 2.4 mg/dL Final    Comment:    Performed at [facility], [address] [place], [place] [address]  05/03/2012 1.3 (L) mg/dL Final    Comment:    6.9-6.2 THERAPEUTIC  RANGE: 4-7 mg/dL TOXIC: > 10 mg/dL  -----------------------    Hgb A1c MFr Bld  Date Value Ref Range Status  11/01/2023 5.3 4.8 - 5.6 % Final    Comment:    (NOTE) Pre diabetes:          5.7%-6.4%  Diabetes:              >6.4%  Glycemic control for   <7.0% adults with diabetes    TSH  Date Value Ref Range Status  11/03/2023 2.236 0.350 - 4.500 uIU/mL Final    Comment:    Performed by a 3rd Generation assay with a functional sensitivity of <=0.01 uIU/mL. Performed at [facility], [address] [place], [place] [address]     Vital Signs:  Temp:  [97.7 F (36.5 C)-98.9 F (37.2 C)] 97.8 F (36.6 C) (03/06 0831) Pulse Rate:  [68-88] 88 (03/06 0831) Cardiac Rhythm: Atrial fibrillation;Bundle branch block (03/06 0827) Resp:  [18-20] 18 (03/06 0831) BP: (98-125)/(47-75) 98/75 (03/06 0831) SpO2:  [93 %-98 %] 97 % (03/06 0831) Weight:  [61 kg (134 lb 7.7 oz)] 61 kg (134 lb 7.7 oz) (03/06 0448) No intake or output data in the 24 hours ending 11/04/23 0936   Current Heart Failure Medications:  Loop diuretic: furosemide 20 mg IV q12h Beta-Blocker: metoprolol succinate 125 mg daily ACEI/ARB/ARNI: Entresto 24-26 mg BID MRA: spironolactone 12.5 mg daily SGLT2i: none  Prior to admission Heart Failure Medications:  Loop diuretic:  none Beta-Blocker: metoprolol succinate 125 mg daily ACEI/ARB/ARNI: losartan 25 mg daily MRA: none SGLT2i: none  Assessment: 1. Acute on chronic combined systolic and diastolic heart failure (LVEF 40-45%) with history fo grade II diastolic dysfunction, due to NICM. NYHA class III symptoms.  -Symptoms: Reports shortness of breath with ambulating from bed to toilet, but does feel better overall.  -Volume: Appears euvolemic. Weight down 5 lbs today Creatinine and BUN are stable and chloride is trending down. Poorly documented urine output documented.  -Hemodynamics: BP significantly improved with one low-normal reading this AM.  Asymptomatic. HR 70-80s. -FA:[id number] metoprolol succinate 125 mg daily -ACEI/ARB/ARNI: Continue Entersto 24-26 mg BID, BP too soft to increase. -MRA: Agree with increasing spironolactone to 25 mg daily. Will monitor BP. Should also help maintain normokalemia. -SGLT2i: Not a good candidate given recent UTI  Plan: 1) Medication changes recommended at this time: -Agree with increasing spironolactone to 25 mg daily  2) Patient assistance: -Entresto copay is $4  3) Education: Please do not hesitate to reach out with questions or concerns,  Enos Fling, PharmD, CPP, BCPS Heart Failure Pharmacist  Phone - [phone] 11/04/2023 9:36 AM  Medication Assistance / Insurance Benefits Check: Does the patient have prescription insurance?    Type of insurance plan:  Does the patient qualify for medication assistance through manufacturers or grants? No   Outpatient Pharmacy: Prior to admission outpatient pharmacy: [facility]   Is the patient willing to utilize a [facility] pharmacy at discharge?: Yes  Please do not hesitate to reach out with questions or concerns,  Enos Fling, PharmD, CPP, BCPS Heart Failure Pharmacist  Phone - [phone] 11/04/2023 9:36 AM

## 2023-11-04 NOTE — TOC Progression Note (Signed)
 Transition of Care [facility]) - Progression Note    Patient Details  Name: Andrea Griffin MRN: [id number] Date of Birth: Jun 09, 1932  Transition of Care [facility]) CM/SW Contact  Truddie Hidden, RN Phone Number: 11/04/2023, 9:54 AM  Clinical Narrative:    RNCM spoke with patient regarding therapy's recommendation for [facility] PT/ OT. Patient is agreeable to [facility] PT she would prefer [facility]. Patient advised the accepting agency will contact her directly to scheduled SOC within 48 post discharge.   Referral sent and accepted by [place] from [facility].     Expected Discharge Plan: Home w Home Health Services Barriers to Discharge: Continued Medical Work up  Expected Discharge Plan and Services     Post Acute Care Choice:  (TBD) Living arrangements for the past 2 months: Single Family Home                                       Social Determinants of Health (SDOH) Interventions SDOH Screenings   Food Insecurity: No Food Insecurity (11/02/2023)  Housing: Low Risk  (11/02/2023)  Transportation Needs: No Transportation Needs (11/02/2023)  Utilities: Not At Risk (11/02/2023)  Financial Resource Strain: Low Risk  (07/14/2023)   Received from [facility]  Social Connections: Socially Integrated (11/02/2023)  Tobacco Use: Low Risk  (10/31/2023)    Readmission Risk Interventions    11/02/2023    4:07 PM  Readmission Risk Prevention Plan  Transportation Screening Complete  PCP or Specialist Appt within 3-5 Days Complete  Social Work Consult for Recovery Care Planning/Counseling Complete  Palliative Care Screening Not Applicable  Medication Review Oceanographer) Complete

## 2023-11-04 NOTE — Progress Notes (Addendum)
 PROGRESS NOTE    Andrea Griffin   ZOX:[id number] DOB: [birth year]  DOA: 10/31/2023 Date of Service: 11/04/23 which is hospital day 3  PCP: Andrea Ivan, MD    Hospital course / significant events:   HPI: Andrea Griffin is a 89 y.o. female with medical history significant for  HTN, HLD, hyperthyroidism, nonobstructive CAD on cath November 2024, paroxysmal A-fib and left atrial appendage thrombus on Coumadin, cardiomyopathy (EF 40 to 45% on TEE 09/2023) last evaluated by her cardiologist outpatient a couple weeks prior on 10/18/2023 when she had ongoing exertional dyspnea and palpitations and left chest discomfort attributed to symptomatic persistent A-fib for which GDMT was adjusted(losartan added), who was brought to the ED with a complaint of slurred speech on awaking this morning as well as a several week history of weakness, malaise and generally feeling unwell.    03/02: to ED, admitted to hospitalist w/ acute on chronic HFrEF, acute respiratory failure d/t CHF and pleural effusion, question TIA/CVA, chronic hyponatremia. CT/MRI negative for CVA. 03/03: cardiology consulted, advise continue diuresis which pt was not on prior to hospitalization, consider add spironolactone, no further diagnostics  03/04: conitnuing IV diuresis 03/05: pt c/o more SOB today, reviewed CT images which did show significant effusion, US thoracentesis ordered if enough fluid to pull based on [place], IR eval and effusion had reduced sufficiently that no thoracentesis was performed  03/06: still O2 requirement, pt is relctant for home oxygen, failed walk test today, will recheck tomorrow, if otherwise stable can likely plan on dc home w/ O2, if worse will stay here      Consultants:  Cardiology   Procedures/Surgeries: none      ASSESSMENT & PLAN:  Acute on chronic HFrEF (heart failure with reduced ejection fraction)  NICM, EF 40 to 45% on TEE 09/2023 Right pleural effusion/anasarca Continue IV  Lasix Monitor input and output as well as daily weight GDMT with metoprolol and losartan, spironolactone. NO SGLT2 d/t frequent UTI Cardiologist following    Acute stroke ruled out Possible TIA MRI of the brain did not show any acute intracranial pathology PT OT eval  Nonobstructive CAD on [facility] 07/2023 Patient with occasional chest pain.  Troponin normal.  EKG nonacute Continue metoprolol, losartan, warfarin   Persistent atrial fibrillation  Thrombus left atrial appendage Warfarin anticoagulation, subtherapeutic at 1.9 on arrival, now at 2.6 Currently rate controlled with metoprolol bridging warfarin with Lovenox until therapeutic, can d/c Lovenox today INR is at goal    Hyponatremia Chronic.  Likely hypervolemic Expecting improvement with fluid restriction and diuretics Continue management as above Monitor BMP   Frailty/ physical deconditioning Generalized weakness Patient with advanced age, multiple comorbidities, recent hospitalizations PT/OT and TOC eval   Hyperthyroidism Will get her thyroid function panel Continue methimazole for now   Essential hypertension Continue metoprolol and losartan    DVT prophylaxis: coumadin IV fluids: no continuous IV fluids  Nutrition: cardiac diet  Central lines / invasive devices: none  Code Status: DNR ACP documentation reviewed: advanced directive from 2017 on file in [facility] however this form appears to be for an Governor Rooks NOT for this patient - was voided   TOC needs: home health  Barriers to dispo / significant pending items: O2 requirement, if able to ambulate on 2L O2 or on A tomorrow anticiapte discharge              Subjective / Brief ROS:  Patient reports breathing is still a bit short on ambulation Denies CP Pain  controlled but hip pain limits ambulation a bit  Denies new weakness.  Tolerating diet.  Reports no concerns w/ urination/defecation.   Family Communication: family at bedside on rounds      Objective Findings:  Vitals:   11/04/23 0448 11/04/23 0831 11/04/23 1210 11/04/23 1516  BP:  98/75 118/70 (!) 92/57  Pulse:  88 78 89  Resp:  18 19 18   Temp:  97.8 F (36.6 C) 97.6 F (36.4 C) 98.8 F (37.1 C)  TempSrc:   Oral   SpO2:  97% 98% 95%  Weight: 61 kg     Height:        Intake/Output Summary (Last 24 hours) at 11/04/2023 1542 Last data filed at 11/04/2023 1100 Gross per 24 hour  Intake 360 ml  Output --  Net 360 ml   Filed Weights   11/03/23 0457 11/03/23 0500 11/04/23 0448  Weight: 63.1 kg 63.3 kg 61 kg    Examination:  Physical Exam Constitutional:      General: She is not in acute distress. Cardiovascular:     Rate and Rhythm: Normal rate and regular rhythm.  Pulmonary:     Effort: No accessory muscle usage or respiratory distress.     Breath sounds: Examination of the left-lower field reveals rales. Rales present. No rhonchi.     Comments: Aeration improved from yesterday  Neurological:     Mental Status: She is alert.          Scheduled Medications:   escitalopram  5 mg Oral Daily   furosemide  40 mg Oral Daily   guaiFENesin  600 mg Oral BID   methIMAzole  2.5 mg Oral Daily   metoprolol succinate  125 mg Oral Daily   mometasone-formoterol  2 puff Inhalation BID   sacubitril-valsartan  1 tablet Oral BID   spironolactone  25 mg Oral Daily   warfarin  5 mg Oral ONCE-1600   Warfarin - Pharmacist Dosing Inpatient   Does not apply q1600    Continuous Infusions:   PRN Medications:  acetaminophen **OR** acetaminophen, albuterol, HYDROcodone-acetaminophen, ondansetron **OR** ondansetron (ZOFRAN) IV  Antimicrobials from admission:  Anti-infectives (From admission, onward)    None           Data Reviewed:  I have personally reviewed the following...  CBC: Recent Labs  Lab 10/31/23 1545 11/01/23 0451 11/02/23 0423 11/03/23 0436 11/04/23 0410  WBC 8.4 8.2 7.7 8.0 8.8  NEUTROABS 5.7  --   --   --   --   HGB 12.2 10.9*  11.5* 11.8* 12.5  HCT 37.1 32.9* 34.5* 34.0* 36.6  MCV 91.8 91.9 89.8 88.1 86.9  PLT 342 274 298 304 362   Basic Metabolic Panel: Recent Labs  Lab 10/31/23 1545 11/01/23 0451 11/02/23 0423 11/03/23 0436 11/04/23 0410  NA 129* 127* 129* 128* 128*  K 3.7 3.5 3.2* 3.8 3.5  CL 94* 92* 90* 92* 91*  CO2 26 26 29 28 27   GLUCOSE 102* 86 78 80 95  BUN 12 10 10 12 12   CREATININE 0.45 0.56 0.39* 0.42* 0.43*  CALCIUM 8.7* 8.4* 8.6* 8.5* 8.5*   GFR: Estimated Creatinine Clearance: 40 mL/min (A) (by C-G formula based on SCr of 0.43 mg/dL (L)). Liver Function Tests: Recent Labs  Lab 10/31/23 1545  AST 29  ALT 22  ALKPHOS 79  BILITOT 1.5*  PROT 7.2  ALBUMIN 3.6   No results for input(s): "LIPASE", "AMYLASE" in the last 168 hours. No results for input(s): "AMMONIA" in  the last 168 hours. Coagulation Profile: Recent Labs  Lab 10/31/23 1545 11/01/23 0451 11/02/23 0423 11/03/23 0436 11/04/23 0410  INR 1.9* 1.9* 2.4* 2.6* 2.4*   Cardiac Enzymes: No results for input(s): "CKTOTAL", "CKMB", "CKMBINDEX", "TROPONINI" in the last 168 hours. BNP (last 3 results) No results for input(s): "PROBNP" in the last 8760 hours. HbA1C: No results for input(s): "HGBA1C" in the last 72 hours.  CBG: Recent Labs  Lab 10/31/23 1500  GLUCAP 121*   Lipid Profile: No results for input(s): "CHOL", "HDL", "LDLCALC", "TRIG", "CHOLHDL", "LDLDIRECT" in the last 72 hours.  Thyroid Function Tests: Recent Labs    11/03/23 0435  TSH 2.236   Anemia Panel: No results for input(s): "VITAMINB12", "FOLATE", "FERRITIN", "TIBC", "IRON", "RETICCTPCT" in the last 72 hours. Most Recent Urinalysis On File:     Component Value Date/Time   COLORURINE YELLOW (A) 10/31/2023 1545   APPEARANCEUR CLEAR (A) 10/31/2023 1545   APPEARANCEUR Clear 03/13/2014 1403   LABSPEC 1.018 10/31/2023 1545   LABSPEC 1.030 03/13/2014 1403   PHURINE 8.0 10/31/2023 1545   GLUCOSEU NEGATIVE 10/31/2023 1545   GLUCOSEU Negative  03/13/2014 1403   HGBUR NEGATIVE 10/31/2023 1545   BILIRUBINUR NEGATIVE 10/31/2023 1545   BILIRUBINUR Negative 03/13/2014 1403   KETONESUR NEGATIVE 10/31/2023 1545   PROTEINUR 100 (A) 10/31/2023 1545   NITRITE NEGATIVE 10/31/2023 1545   LEUKOCYTESUR NEGATIVE 10/31/2023 1545   LEUKOCYTESUR Negative 03/13/2014 1403   Sepsis Labs: @LABRCNTIP (procalcitonin:4,lacticidven:4) Microbiology: Recent Results (from the past 240 hours)  Resp panel by RT-PCR (RSV, Flu A&B, Covid) Anterior Nasal Swab     Status: None   Collection Time: 10/31/23  3:45 PM   Specimen: Anterior Nasal Swab  Result Value Ref Range Status   SARS Coronavirus 2 by RT PCR NEGATIVE NEGATIVE Final    Comment: (NOTE) SARS-CoV-2 target nucleic acids are NOT DETECTED.  The SARS-CoV-2 RNA is generally detectable in upper respiratory specimens during the acute phase of infection. The lowest concentration of SARS-CoV-2 viral copies this assay can detect is 138 copies/mL. A negative result does not preclude SARS-Cov-2 infection and should not be used as the sole basis for treatment or other patient management decisions. A negative result may occur with  improper specimen collection/handling, submission of specimen other than nasopharyngeal swab, presence of viral mutation(s) within the areas targeted by this assay, and inadequate number of viral copies(<138 copies/mL). A negative result must be combined with clinical observations, patient history, and epidemiological information. The expected result is Negative.  Fact Sheet for Patients:  BloggerCourse.com  Fact Sheet for Healthcare Providers:  SeriousBroker.it  This test is no t yet approved or cleared by the [place] FDA and  has been authorized for detection and/or diagnosis of SARS-CoV-2 by FDA under an Emergency Use Authorization (EUA). This EUA will remain  in effect (meaning this test can be used) for the  duration of the COVID-19 declaration under Section 564(b)(1) of the Act, 21 U.S.C.section 360bbb-3(b)(1), unless the authorization is terminated  or revoked sooner.       Influenza A by PCR NEGATIVE NEGATIVE Final   Influenza B by PCR NEGATIVE NEGATIVE Final    Comment: (NOTE) The Xpert Xpress SARS-CoV-2/FLU/RSV plus assay is intended as an aid in the diagnosis of influenza from Nasopharyngeal swab specimens and should not be used as a sole basis for treatment. Nasal washings and aspirates are unacceptable for Xpert Xpress SARS-CoV-2/FLU/RSV testing.  Fact Sheet for Patients: BloggerCourse.com  Fact Sheet for Healthcare Providers: SeriousBroker.it  This test  is not yet approved or cleared by the [place] and has been authorized for detection and/or diagnosis of SARS-CoV-2 by FDA under an Emergency Use Authorization (EUA). This EUA will remain in effect (meaning this test can be used) for the duration of the COVID-19 declaration under Section 564(b)(1) of the Act, 21 U.S.C. section 360bbb-3(b)(1), unless the authorization is terminated or revoked.     Resp Syncytial Virus by PCR NEGATIVE NEGATIVE Final    Comment: (NOTE) Fact Sheet for Patients: BloggerCourse.com  Fact Sheet for Healthcare Providers: SeriousBroker.it  This test is not yet approved or cleared by the [place] FDA and has been authorized for detection and/or diagnosis of SARS-CoV-2 by FDA under an Emergency Use Authorization (EUA). This EUA will remain in effect (meaning this test can be used) for the duration of the COVID-19 declaration under Section 564(b)(1) of the Act, 21 U.S.C. section 360bbb-3(b)(1), unless the authorization is terminated or revoked.  Performed at [facility], [address], [place], [place] [address]       Radiology Studies last 3 days: [place] CHEST (PLEURAL  EFFUSION) Result Date: 11/03/2023 INDICATION: Shortness of breath, right pleural effusion EXAM: CHEST ULTRASOUND COMPARISON:  CTA CHEST 10/31/23 FINDINGS: Small right-sided pleural effusion. No significant pocket of fluid or percutaneous window to allow safe thoracentesis. Risks outweigh the benefits. IMPRESSION: Small RIGHT pleural effusion with no safe window for percutaneous access Performed by: Loyce Dys PA-C Electronically Signed   By: Judie Petit.  Shick M.D.   On: 11/03/2023 15:37   MR BRAIN WO CONTRAST Result Date: 11/01/2023 CLINICAL DATA:  Initial evaluation for acute neuro deficit, stroke suspected. EXAM: MRI HEAD WITHOUT CONTRAST TECHNIQUE: Multiplanar, multiecho pulse sequences of the brain and surrounding structures were obtained without intravenous contrast. COMPARISON:  Prior CT from earlier the same day. FINDINGS: Brain: Cerebral volume within normal limits. Patchy T2/FLAIR hyperintensity involving the periventricular white matter, most characteristic of chronic microvascular ischemic disease, mild for age. No evidence for acute or subacute ischemia. No areas of chronic cortical infarction. No acute or chronic intracranial blood products. No mass lesion, midline shift or mass effect. No hydrocephalus or extra-axial fluid collection. Pituitary gland and suprasellar region within normal limits. Vascular: Major intracranial vascular flow voids are maintained. Skull and upper cervical spine: Craniocervical junction within normal limits. Bone marrow signal intensity normal. No scalp soft tissue abnormality. Sinuses/Orbits: Prior bilateral ocular lens replacement. Chronic left maxillary sinusitis noted. Paranasal sinuses are otherwise largely clear. Trace bilateral mastoid effusions noted, of doubtful significance. Other: None. IMPRESSION: 1. No acute intracranial abnormality. 2. Mild chronic microvascular ischemic disease for age. 3. Chronic left maxillary sinusitis. Electronically Signed   By: Rise Mu M.D.   On: 11/01/2023 00:04   CT Angio Chest/Abd/Pel for Dissection W and/or W/WO Result Date: 10/31/2023 CLINICAL DATA:  Difficulty speaking altered EXAM: CT ANGIOGRAPHY CHEST, ABDOMEN AND PELVIS TECHNIQUE: Non-contrast CT of the chest was initially obtained. Multidetector CT imaging through the chest, abdomen and pelvis was performed using the standard protocol during bolus administration of intravenous contrast. Multiplanar reconstructed images and MIPs were obtained and reviewed to evaluate the vascular anatomy. RADIATION DOSE REDUCTION: This exam was performed according to the departmental dose-optimization program which includes automated exposure control, adjustment of the mA and/or kV according to patient size and/or use of iterative reconstruction technique. CONTRAST:  OMNIPAQUE IOHEXOL 350 MG/ML SOLN COMPARISON:  Chest x-ray 10/31/2023, CT 07/14/2023, 05/01/2012, chest CT 05/06/2023 FINDINGS: CTA CHEST FINDINGS Cardiovascular: Non contrasted images of the  chest demonstrate no acute intramural hematoma. Negative for aortic dissection or aneurysm. Cardiomegaly with marked biatrial enlargement. No significant pericardial effusion. Mediastinum/Nodes: Patent trachea. No suspicious thyroid mass. Subcentimeter mediastinal lymph nodes. Esophagus within normal limits Lungs/Pleura: Subpleural reticulation consistent with pulmonary fibrosis. Small left and moderate right pleural effusions. Mild diffuse hazy lung attenuation likely due to edema. Stable scattered small pulmonary nodules measuring up to 4 mm at the superior left lower lobe on series 7, image 49 for which no additional imaging follow-up is recommended. Musculoskeletal: Sternum is intact.  No acute osseous abnormality Review of the MIP images confirms the above findings. CTA ABDOMEN AND PELVIS FINDINGS VASCULAR Aorta: Normal caliber aorta without aneurysm, dissection, vasculitis or significant stenosis. Celiac: Patent without evidence  of aneurysm, dissection, vasculitis or significant stenosis. Left gastric artery arises directly from the aorta, superior to the celiac trunk. SMA: Patent without evidence of aneurysm, dissection, vasculitis or significant stenosis. Renals: Both renal arteries are patent without evidence of aneurysm, dissection, vasculitis, fibromuscular dysplasia or significant stenosis. IMA: Patent without evidence of aneurysm, dissection, vasculitis or significant stenosis. Inflow: Patent without evidence of aneurysm, dissection, vasculitis or significant stenosis. Veins: Suboptimally assessed Review of the MIP images confirms the above findings. NON-VASCULAR Hepatobiliary: Cholecystectomy. No biliary dilatation. Question mild surface nodularity of the liver Pancreas: Unremarkable. No pancreatic ductal dilatation or surrounding inflammatory changes. Spleen: Normal in size without focal abnormality. Adrenals/Urinary Tract: Adrenal glands are normal. Kidneys show no hydronephrosis. Cyst mid to upper pole left kidney for which no imaging follow-up is recommended. The bladder is obscured by artifact Stomach/Bowel: Stomach nonenlarged. No dilated small bowel. No acute bowel wall thickening. Diverticular disease of the sigmoid colon. Negative appendix Lymphatic: No suspicious lymph nodes. Reproductive: Largely obscured by artifact.  No obvious adnexal mass Other: Negative for pelvic effusion or free air Musculoskeletal: Generalized subcutaneous edema. Bilateral hip replacements with artifact. Posterior spinal fusion hardware at L4-L5. Sacral Tarlov cysts. Review of the MIP images confirms the above findings. IMPRESSION: 1. Negative for acute aortic dissection or aneurysm. No significant vascular disease within the abdomen or pelvis 2. Small left and moderate right pleural effusions. Cardiomegaly with diffuse hazy pulmonary densities suspected to represent edema 3. Question surface nodularity of the liver as may be seen with cirrhosis,  correlate for risk factors 4. No CT evidence for acute intra-abdominal or pelvic abnormality 5. Generalized subcutaneous edema consistent with anasarca 6. Diverticular disease of the sigmoid colon without acute inflammation Electronically Signed   By: Jasmine Pang M.D.   On: 10/31/2023 18:30   DG Chest Portable 1 View Result Date: 10/31/2023 CLINICAL DATA:  Altered mental status EXAM: PORTABLE CHEST 1 VIEW COMPARISON:  10/01/2023 FINDINGS: Cardiomegaly with vascular congestion and mild diffuse interstitial opacity suspicious for edema. Probable small pleural effusions. No pneumothorax IMPRESSION: Cardiomegaly with vascular congestion and mild interstitial edema. Probable small effusions Electronically Signed   By: Jasmine Pang M.D.   On: 10/31/2023 16:07       Time spent: 50 min     Sunnie Nielsen, DO [facility] 11/04/2023, 3:42 PM    Dictation software may have been used to generate the above note. Typos may occur and escape review in typed/dictated notes. Please contact Dr Lyn Hollingshead directly for clarity if needed.  Staff may message me via secure chat in Epic  but this may not receive an immediate response,  please page me for urgent matters!  If 7PM-7AM, please contact night coverage [email or web address]

## 2023-11-04 NOTE — Progress Notes (Signed)
 [name] CLINIC CARDIOLOGY PROGRESS NOTE       Patient ID: Andrea Griffin MRN: [id number] DOB/AGE: 06-Mar-1932 89 y.o.  Admit date: 10/31/2023 Referring Physician Dr. Lindajo Royal Primary Physician Marisue Ivan, MD  Primary Cardiologist Dr. Corky Sing Reason for Consultation AoCHF  HPI: Andrea Griffin is a 89 y.o. female  with a past medical history of persistent atrial fibrillation, hypertension, hyperlipidemia, hypothyroidism who presented to the ED on 10/31/2023 for shortness of breath.  BNP found to be elevated.  Cardiology was consulted for further evaluation.   Interval history: -Patient seen and examined this morning, resting comfortably in hospital bed. Reports she feels better overall. -Renal function remains stable, still on supplemental O2.  -Shortness of breath slightly improved.  Denies any chest pain symptoms. -BP and heart rate remained stable.  Review of systems complete and found to be negative unless listed above    Past Medical History:  Diagnosis Date   Anxiety    Arthritis    Asthma    Cancer (HCC)    melanoma skin cancer on head   Diastolic dysfunction    a. 05/2023 Echo: EF 70-76^, no rwma, GrII DD, nl RV fxn, mildly dil RA, mod dil LA. Mild MR. Mod AI.   GERD (gastroesophageal reflux disease)    History of recurrent cystitis    Hyperlipidemia    Hypertension    Infection of prosthetic hip joint (HCC)    Mitral regurgitation    Moderate aortic insufficiency    Nocturia    Obesity    Osteopenia    Persistent atrial fibrillation (HCC)    Thyroid disease    Urinary frequency    Urinary incontinence    Urinary urgency    Wears glasses     Past Surgical History:  Procedure Laterality Date   ABDOMINAL HYSTERECTOMY     BACK SURGERY  01/21/2016   lumbar fusion   bladder stem dilation     BREAST BIOPSY Right    x2   BREAST BIOPSY Right    x2   CARDIOVERSION N/A 08/09/2023   Procedure: CARDIOVERSION;  Surgeon: Kathryne Gin, MD;  Location:  [facility] ORS;  Service: Cardiovascular;  Laterality: N/A;   CARDIOVERSION N/A 09/20/2023   Procedure: CARDIOVERSION;  Surgeon: Kathryne Gin, MD;  Location: [facility] ORS;  Service: Cardiovascular;  Laterality: N/A;   CHOLECYSTECTOMY     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 Rosita Kea   INCISION AND DRAINAGE HIP Right 09/15/2012   INCISION AND DRAINAGE HIP Right 2013   Dr. Rosita Kea   LEFT HEART CATH AND CORONARY ANGIOGRAPHY N/A 07/16/2023   Procedure: LEFT HEART CATH AND CORONARY ANGIOGRAPHY;  Surgeon: Iran Ouch, MD;  Location: [facility] INVASIVE CV LAB;  Service: Cardiovascular;  Laterality: N/A;   LUMBAR FUSION  01/21/2016   L4-L5 by Dr. Danielle Dess   MELANOMA EXCISION     skin cancer on top of head   OPEN REDUCTION INTERNAL FIXATION (ORIF) DISTAL RADIAL FRACTURE Right 10/05/2019   Procedure: OPEN REDUCTION INTERNAL FIXATION (ORIF) DISTAL RADIAL FRACTURE;  Surgeon: Kennedy Bucker, MD;  Location: [facility] ORS;  Service: Orthopedics;  Laterality: Right;   TEE WITHOUT CARDIOVERSION N/A 08/09/2023   Procedure: TRANSESOPHAGEAL ECHOCARDIOGRAM (TEE);  Surgeon: Alluri, Meryl Dare, MD;  Location: [facility] ORS;  Service: Cardiovascular;  Laterality: N/A;   TEE WITHOUT CARDIOVERSION N/A 09/20/2023   Procedure: TRANSESOPHAGEAL ECHOCARDIOGRAM (TEE);  Surgeon: Alluri, Mellody Drown  C, MD;  Location: [facility] ORS;  Service: Cardiovascular;  Laterality: N/A;    Medications Prior to Admission  Medication Sig Dispense Refill Last Dose/Taking   acetaminophen (TYLENOL) 500 MG tablet Take 1,000 mg by mouth 2 (two) times daily.   10/31/2023 Morning   amoxicillin (AMOXIL) 500 MG capsule Take 500 mg by mouth 2 (two) times daily.   10/31/2023 Morning   atorvastatin (LIPITOR) 40 MG tablet Take 1 tablet (40 mg total) by mouth daily. 30 tablet 0 10/31/2023 Morning   Calcium Carb-Cholecalciferol (CALCIUM 600+D) 600-800 MG-UNIT TABS Take 2 tablets by mouth  in the morning.   10/31/2023 Morning   escitalopram (LEXAPRO) 5 MG tablet Take 5 mg by mouth daily.   10/30/2023 at  8:30 PM   fexofenadine (ALLEGRA) 180 MG tablet Take 180 mg by mouth daily.   10/31/2023 Morning   fluticasone (FLONASE) 50 MCG/ACT nasal spray Place 2 sprays into both nostrils daily.   10/30/2023 Bedtime   Fluticasone-Salmeterol (ADVAIR) 100-50 MCG/DOSE AEPB Inhale 1 puff into the lungs every 12 (twelve) hours.   10/31/2023 Morning   Lidocaine (BLUE-EMU PAIN RELIEF DRY EX) Apply 1 application  topically 2 (two) times daily as needed (Back pain).   10/30/2023 Evening   losartan (COZAAR) 25 MG tablet Take 1 tablet by mouth daily.   10/31/2023 Morning   methimazole (TAPAZOLE) 5 MG tablet Take 2.5 mg by mouth daily.   10/31/2023 Morning   metoprolol succinate (TOPROL-XL) 100 MG 24 hr tablet Take 1 tablet (100 mg total) by mouth 2 (two) times daily. Take with or immediately following a meal. (Patient taking differently: Take 100 mg by mouth daily. 100 mg + 25 mg=125 mg Take with or immediately following a meal. daily) 60 tablet 0 10/30/2023 Noon   metoprolol succinate (TOPROL-XL) 25 MG 24 hr tablet Take 25 mg by mouth See admin instructions. 25 mg + 100 mg=125 mg daily   10/30/2023 Noon   omeprazole (PRILOSEC) 20 MG capsule Take 20 mg by mouth 2 (two) times daily.    10/31/2023 Morning   senna-docusate (SENOKOT-S) 8.6-50 MG tablet Take 1 tablet by mouth daily as needed for mild constipation.   10/31/2023 Morning   warfarin (COUMADIN) 2.5 MG tablet Take 2.5-5 mg by mouth every evening. Take 2 tablets on Monday, Wednesday, Friday and Sunday. Take 1 tablet on Tuesday, Thursday and Saturday. For INR between 2.5 and 3.   10/30/2023 at  8:30 PM   apixaban (ELIQUIS) 5 MG TABS tablet Take 1 tablet (5 mg total) by mouth 2 (two) times daily. (Patient not taking: Reported on 10/31/2023) 60 tablet 0 Not Taking   ondansetron (ZOFRAN-ODT) 4 MG disintegrating tablet Take 1 tablet (4 mg total) by mouth every 8 (eight) hours as  needed for nausea or vomiting. (Patient not taking: Reported on 10/31/2023) 12 tablet 0 Not Taking   Social History   Socioeconomic History   Marital status: Married    Spouse name: Not on file   Number of children: Not on file   Years of education: Not on file   Highest education level: Not on file  Occupational History   Not on file  Tobacco Use   Smoking status: Never   Smokeless tobacco: Never  Vaping Use   Vaping status: Never Used  Substance and Sexual Activity   Alcohol use: No   Drug use: No   Sexual activity: Not Currently  Other Topics Concern   Not on file  Social History Narrative   Lives with  husband, Earle.    Social Drivers of Corporate investment banker Strain: Low Risk  (07/14/2023)   Received from [facility]   Overall Financial Resource Strain (CARDIA)    Difficulty of Paying Living Expenses: Not hard at all  Food Insecurity: No Food Insecurity (11/02/2023)   Hunger Vital Sign    Worried About Running Out of Food in the Last Year: Never true    Ran Out of Food in the Last Year: Never true  Transportation Needs: No Transportation Needs (11/02/2023)   PRAPARE - Administrator, Civil Service (Medical): No    Lack of Transportation (Non-Medical): No  Physical Activity: Not on file  Stress: Not on file  Social Connections: Socially Integrated (11/02/2023)   Social Connection and Isolation Panel [NHANES]    Frequency of Communication with Friends and Family: Twice a week    Frequency of Social Gatherings with Friends and Family: More than three times a week    Attends Religious Services: 1 to 4 times per year    Active Member of Clubs or Organizations: No    Attends Banker Meetings: 1 to 4 times per year    Marital Status: Married  Catering manager Violence: Not At Risk (11/02/2023)   Humiliation, Afraid, Rape, and Kick questionnaire    Fear of Current or Ex-Partner: No    Emotionally Abused: No    Physically Abused:  No    Sexually Abused: No    Family History  Problem Relation Age of Onset   Stroke Mother      Vitals:   11/03/23 2357 11/04/23 0329 11/04/23 0448 11/04/23 0831  BP: (!) 120/47 (!) 120/59  98/75  Pulse: 75 75  88  Resp: 18   18  Temp: 98.3 F (36.8 C) 98.9 F (37.2 C)  97.8 F (36.6 C)  TempSrc:  Oral    SpO2: 96% 95%  97%  Weight:   61 kg   Height:        PHYSICAL EXAM General: Chronically ill-appearing elderly female, well nourished, in no acute distress. HEENT: Normocephalic and atraumatic. Neck: No JVD.  Lungs: Normal respiratory effort on 2L [place].  Lungs CTAB Heart: Irregularly irregular, controlled rate. Normal S1 and S2 without gallops or murmurs.  Abdomen: Non-distended appearing.  Msk: Normal strength and tone for age. Extremities: Warm and well perfused. No clubbing, cyanosis.  No edema.  Neuro: Alert and oriented X 3. Psych: Answers questions appropriately.   Labs: Basic Metabolic Panel: Recent Labs    11/03/23 0436 11/04/23 0410  NA 128* 128*  K 3.8 3.5  CL 92* 91*  CO2 28 27  GLUCOSE 80 95  BUN 12 12  CREATININE 0.42* 0.43*  CALCIUM 8.5* 8.5*   Liver Function Tests: No results for input(s): "AST", "ALT", "ALKPHOS", "BILITOT", "PROT", "ALBUMIN" in the last 72 hours.  No results for input(s): "LIPASE", "AMYLASE" in the last 72 hours. CBC: Recent Labs    11/03/23 0436 11/04/23 0410  WBC 8.0 8.8  HGB 11.8* 12.5  HCT 34.0* 36.6  MCV 88.1 86.9  PLT 304 362   Cardiac Enzymes: No results for input(s): "CKTOTAL", "CKMB", "CKMBINDEX", "TROPONINIHS" in the last 72 hours.  BNP: No results for input(s): "BNP" in the last 72 hours.  D-Dimer: No results for input(s): "DDIMER" in the last 72 hours. Hemoglobin A1C: No results for input(s): "HGBA1C" in the last 72 hours.  Fasting Lipid Panel: No results for input(s): "CHOL", "HDL", "LDLCALC", "TRIG", "  CHOLHDL", "LDLDIRECT" in the last 72 hours.  Thyroid Function Tests: Recent Labs     11/03/23 0435  TSH 2.236   Anemia Panel: No results for input(s): "VITAMINB12", "FOLATE", "FERRITIN", "TIBC", "IRON", "RETICCTPCT" in the last 72 hours.   Radiology: [place] CHEST (PLEURAL EFFUSION) Result Date: 11/03/2023 INDICATION: Shortness of breath, right pleural effusion EXAM: CHEST ULTRASOUND COMPARISON:  CTA CHEST 10/31/23 FINDINGS: Small right-sided pleural effusion. No significant pocket of fluid or percutaneous window to allow safe thoracentesis. Risks outweigh the benefits. IMPRESSION: Small RIGHT pleural effusion with no safe window for percutaneous access Performed by: Loyce Dys PA-C Electronically Signed   By: Judie Petit.  Shick M.D.   On: 11/03/2023 15:37   MR BRAIN WO CONTRAST Result Date: 11/01/2023 CLINICAL DATA:  Initial evaluation for acute neuro deficit, stroke suspected. EXAM: MRI HEAD WITHOUT CONTRAST TECHNIQUE: Multiplanar, multiecho pulse sequences of the brain and surrounding structures were obtained without intravenous contrast. COMPARISON:  Prior CT from earlier the same day. FINDINGS: Brain: Cerebral volume within normal limits. Patchy T2/FLAIR hyperintensity involving the periventricular white matter, most characteristic of chronic microvascular ischemic disease, mild for age. No evidence for acute or subacute ischemia. No areas of chronic cortical infarction. No acute or chronic intracranial blood products. No mass lesion, midline shift or mass effect. No hydrocephalus or extra-axial fluid collection. Pituitary gland and suprasellar region within normal limits. Vascular: Major intracranial vascular flow voids are maintained. Skull and upper cervical spine: Craniocervical junction within normal limits. Bone marrow signal intensity normal. No scalp soft tissue abnormality. Sinuses/Orbits: Prior bilateral ocular lens replacement. Chronic left maxillary sinusitis noted. Paranasal sinuses are otherwise largely clear. Trace bilateral mastoid effusions noted, of doubtful significance. Other:  None. IMPRESSION: 1. No acute intracranial abnormality. 2. Mild chronic microvascular ischemic disease for age. 3. Chronic left maxillary sinusitis. Electronically Signed   By: Rise Mu M.D.   On: 11/01/2023 00:04   CT Angio Chest/Abd/Pel for Dissection W and/or W/WO Result Date: 10/31/2023 CLINICAL DATA:  Difficulty speaking altered EXAM: CT ANGIOGRAPHY CHEST, ABDOMEN AND PELVIS TECHNIQUE: Non-contrast CT of the chest was initially obtained. Multidetector CT imaging through the chest, abdomen and pelvis was performed using the standard protocol during bolus administration of intravenous contrast. Multiplanar reconstructed images and MIPs were obtained and reviewed to evaluate the vascular anatomy. RADIATION DOSE REDUCTION: This exam was performed according to the departmental dose-optimization program which includes automated exposure control, adjustment of the mA and/or kV according to patient size and/or use of iterative reconstruction technique. CONTRAST:  OMNIPAQUE IOHEXOL 350 MG/ML SOLN COMPARISON:  Chest x-ray 10/31/2023, CT 07/14/2023, 05/01/2012, chest CT 05/06/2023 FINDINGS: CTA CHEST FINDINGS Cardiovascular: Non contrasted images of the chest demonstrate no acute intramural hematoma. Negative for aortic dissection or aneurysm. Cardiomegaly with marked biatrial enlargement. No significant pericardial effusion. Mediastinum/Nodes: Patent trachea. No suspicious thyroid mass. Subcentimeter mediastinal lymph nodes. Esophagus within normal limits Lungs/Pleura: Subpleural reticulation consistent with pulmonary fibrosis. Small left and moderate right pleural effusions. Mild diffuse hazy lung attenuation likely due to edema. Stable scattered small pulmonary nodules measuring up to 4 mm at the superior left lower lobe on series 7, image 49 for which no additional imaging follow-up is recommended. Musculoskeletal: Sternum is intact.  No acute osseous abnormality Review of the MIP images confirms  the above findings. CTA ABDOMEN AND PELVIS FINDINGS VASCULAR Aorta: Normal caliber aorta without aneurysm, dissection, vasculitis or significant stenosis. Celiac: Patent without evidence of aneurysm, dissection, vasculitis or significant stenosis. Left gastric artery arises directly from the aorta,  superior to the celiac trunk. SMA: Patent without evidence of aneurysm, dissection, vasculitis or significant stenosis. Renals: Both renal arteries are patent without evidence of aneurysm, dissection, vasculitis, fibromuscular dysplasia or significant stenosis. IMA: Patent without evidence of aneurysm, dissection, vasculitis or significant stenosis. Inflow: Patent without evidence of aneurysm, dissection, vasculitis or significant stenosis. Veins: Suboptimally assessed Review of the MIP images confirms the above findings. NON-VASCULAR Hepatobiliary: Cholecystectomy. No biliary dilatation. Question mild surface nodularity of the liver Pancreas: Unremarkable. No pancreatic ductal dilatation or surrounding inflammatory changes. Spleen: Normal in size without focal abnormality. Adrenals/Urinary Tract: Adrenal glands are normal. Kidneys show no hydronephrosis. Cyst mid to upper pole left kidney for which no imaging follow-up is recommended. The bladder is obscured by artifact Stomach/Bowel: Stomach nonenlarged. No dilated small bowel. No acute bowel wall thickening. Diverticular disease of the sigmoid colon. Negative appendix Lymphatic: No suspicious lymph nodes. Reproductive: Largely obscured by artifact.  No obvious adnexal mass Other: Negative for pelvic effusion or free air Musculoskeletal: Generalized subcutaneous edema. Bilateral hip replacements with artifact. Posterior spinal fusion hardware at L4-L5. Sacral Tarlov cysts. Review of the MIP images confirms the above findings. IMPRESSION: 1. Negative for acute aortic dissection or aneurysm. No significant vascular disease within the abdomen or pelvis 2. Small left and  moderate right pleural effusions. Cardiomegaly with diffuse hazy pulmonary densities suspected to represent edema 3. Question surface nodularity of the liver as may be seen with cirrhosis, correlate for risk factors 4. No CT evidence for acute intra-abdominal or pelvic abnormality 5. Generalized subcutaneous edema consistent with anasarca 6. Diverticular disease of the sigmoid colon without acute inflammation Electronically Signed   By: Jasmine Pang M.D.   On: 10/31/2023 18:30   DG Chest Portable 1 View Result Date: 10/31/2023 CLINICAL DATA:  Altered mental status EXAM: PORTABLE CHEST 1 VIEW COMPARISON:  10/01/2023 FINDINGS: Cardiomegaly with vascular congestion and mild diffuse interstitial opacity suspicious for edema. Probable small pleural effusions. No pneumothorax IMPRESSION: Cardiomegaly with vascular congestion and mild interstitial edema. Probable small effusions Electronically Signed   By: Jasmine Pang M.D.   On: 10/31/2023 16:07   CT HEAD WO CONTRAST Result Date: 10/31/2023 CLINICAL DATA:  Neuro deficit.  Evaluate for acute stroke. EXAM: CT HEAD WITHOUT CONTRAST TECHNIQUE: Contiguous axial images were obtained from the base of the skull through the vertex without intravenous contrast. RADIATION DOSE REDUCTION: This exam was performed according to the departmental dose-optimization program which includes automated exposure control, adjustment of the mA and/or kV according to patient size and/or use of iterative reconstruction technique. COMPARISON:  None Available. FINDINGS: Brain: No evidence of acute infarction, hemorrhage, hydrocephalus, extra-axial collection or mass lesion/mass effect. Vascular: No hyperdense vessel or unexpected calcification. Skull: Normal. Negative for fracture or focal lesion. Sinuses/Orbits: There is opacification of the visualized portions of the left maxillary sinus. The remaining paranasal sinuses are clear. Other: None. IMPRESSION: 1. No acute intracranial  abnormalities. 2. Left maxillary sinus disease. Electronically Signed   By: Signa Kell M.D.   On: 10/31/2023 15:49    ECHO TEE 09/20/2023: 1. Left ventricular ejection fraction, by estimation, is 40 to 45%. The  left ventricle has mild to moderately decreased function.   2. Right ventricular systolic function is moderately reduced. The right  ventricular size is normal.   3. Left atrial size was dilated. A left atrial/left atrial appendage  thrombus was detected.   4. Right atrial size was dilated.   5. The mitral valve was not assessed.   6. The  aortic valve was not assessed.   TELEMETRY reviewed by me 11/04/2023: Atrial fibrillation rate 80s  EKG reviewed by me: Atrial fibrillation rate 86 bpm  Data reviewed by me 11/04/2023: last 24h vitals tele labs imaging I/O hospitalist progress note  Principal Problem:   Acute on chronic HFrEF (heart failure with reduced ejection fraction) (HCC) Active Problems:   Hyponatremia   Essential hypertension   Hyperthyroidism   NICM , EF 40-45% 09/2023(nonischemic cardiomyopathy) (HCC)   Persistent atrial fibrillation (HCC)   Warfarin anticoagulation   Thrombus of left atrial appendage   Frailty/ physical deconditioning   Generalized weakness   Nonobstructive CAD on LHC 07/2023   Slurred speech   CHF exacerbation (HCC)    ASSESSMENT AND PLAN:  Andrea Griffin is a 89 y.o. female  with a past medical history of persistent atrial fibrillation, hypertension, hyperlipidemia, hypothyroidism who presented to the ED on 10/31/2023 for shortness of breath.  BNP found to be elevated.  Cardiology was consulted for further evaluation.   # Acute on chronic HFrEF # Persistent atrial fibrillation Patient presented to the ED with complaints of worsening shortness of breath.  BNP elevated at 818.  Troponins normal x 2.  Granddaughter also reports that she was having slurred speech, CT and MRI negative for acute stroke.  Recently was diagnosed with atrial  fibrillation and cardioversion was attempted twice however these were both deferred as she has left atrial appendage thrombus. -Transition to po lasix 40 mg daily.  She was not taking any p.o. diuretic prior to her admission. -Continue Entresto 24-26 mg twice daily.  Increase spironolactone to 25 mg daily, metoprolol succinate 125 mg daily.  Patient would not be a good candidate for SGLT2 inhibitor as she has frequent UTIs. -Continue warfarin as per pharmacy. -No plan for further cardiac diagnostics at this time.  Ok for discharge today from a cardiac perspective. Will arrange for follow up in clinic with Dr. Corky Sing in 1-2 weeks.    This patient's plan of care was discussed and created with Dr. Juliann Pares and he is in agreement.  Signed: Gale Journey, PA-C  11/04/2023, 8:42 AM [facility]

## 2023-11-04 NOTE — Progress Notes (Signed)
 PHARMACY - ANTICOAGULATION CONSULT NOTE  Pharmacy Consult for warfarin  Indication: atrial fibrillation and atrial thrombus  Allergies  Allergen Reactions   Ciprofloxacin Hives   Adhesive [Tape]     Pulls off skin   Nitrofurantoin Other (See Comments)    Can not remember reaction   Phenazopyridine Other (See Comments)    Pt doesn't remember   Ranitidine Other (See Comments)    Can not remember reaction   Sulfa Antibiotics Other (See Comments)    Can not remember reaction    Patient Measurements: Height: 5\' 1"  (154.9 cm) Weight: 61 kg (134 lb 7.7 oz) IBW/kg (Calculated) : 47.8  Vital Signs: Temp: 98.9 F (37.2 C) (03/06 0329) Temp Source: Oral (03/06 0329) BP: 120/59 (03/06 0329) Pulse Rate: 75 (03/06 0329)  Labs: Recent Labs    11/02/23 0423 11/03/23 0436 11/04/23 0410  HGB 11.5* 11.8* 12.5  HCT 34.5* 34.0* 36.6  PLT 298 304 362  LABPROT 26.3* 27.9* 26.4*  INR 2.4* 2.6* 2.4*  CREATININE 0.39* 0.42* 0.43*    Estimated Creatinine Clearance: 40 mL/min (A) (by C-G formula based on SCr of 0.43 mg/dL (L)).   Medical History: Past Medical History:  Diagnosis Date   Anxiety    Arthritis    Asthma    Cancer (HCC)    melanoma skin cancer on head   Diastolic dysfunction    a. 05/2023 Echo: EF 70-76^, no rwma, GrII DD, nl RV fxn, mildly dil RA, mod dil LA. Mild MR. Mod AI.   GERD (gastroesophageal reflux disease)    History of recurrent cystitis    Hyperlipidemia    Hypertension    Infection of prosthetic hip joint (HCC)    Mitral regurgitation    Moderate aortic insufficiency    Nocturia    Obesity    Osteopenia    Persistent atrial fibrillation (HCC)    Thyroid disease    Urinary frequency    Urinary incontinence    Urinary urgency    Wears glasses    Assessment: 89 y/o female presenting with slurred speech. PMH significant for afib and left atrial thrombus (seen on ECHO from 09/2023) on warfarin PTA (recently started in 09/2023), HTN, HLD, and asthma.  Pharmacy has been consulted to resume PTA warfarin.  Warfarin regimen: 2.5 mg on Tue/Thurs/Sat and 5 mg on Mon, Wed, Fri, and Sun (Total weekly dose: 27.5) Last dose: 2.5 mg on 10/30/23   Goal of Therapy:  INR 2.0-3.0 for afib , targeting 2.5-3.0   (per granddaughter, cardiologist at [facility] is targeting higher end of range due to left atrial thrombus) Monitor platelets by anticoagulation protocol: Yes   Date INR Warfarin Dose Comments  3/2 1.9 6mg  Admitted with slurred speech and AMS (possible TIA)  3/3 1.9 7.5 mg Start enoxaparin 1mg /kg q 12hours until INR >/=2.5  3/4 2.4 2.5 mg   3/5 2.6 5 mg   3/6 2.4 5 mg    Plan:  INR is slightly subtherapeutic if aim for a goal if 2.5-3. Will give warfarin 5 mg x 1 (~50% increase from home dose). Enoxaparin stopped. If INR continues to trend down may need to restart. Daily INR. CBC at least every 3 days.   Discharge recommendation: continue home regimen with close outpatient follow up.   Paschal Dopp, PharmD, BCPS 11/04/2023 8:14 AM

## 2023-11-05 DIAGNOSIS — I5023 Acute on chronic systolic (congestive) heart failure: Secondary | ICD-10-CM | POA: Diagnosis not present

## 2023-11-05 LAB — CBC
HCT: 38.5 % (ref 36.0–46.0)
Hemoglobin: 13 g/dL (ref 12.0–15.0)
MCH: 29.7 pg (ref 26.0–34.0)
MCHC: 33.8 g/dL (ref 30.0–36.0)
MCV: 88.1 fL (ref 80.0–100.0)
Platelets: 349 10*3/uL (ref 150–400)
RBC: 4.37 MIL/uL (ref 3.87–5.11)
RDW: 14.5 % (ref 11.5–15.5)
WBC: 10.2 10*3/uL (ref 4.0–10.5)
nRBC: 0 % (ref 0.0–0.2)

## 2023-11-05 LAB — BASIC METABOLIC PANEL
Anion gap: 9 (ref 5–15)
BUN: 14 mg/dL (ref 8–23)
CO2: 27 mmol/L (ref 22–32)
Calcium: 8.5 mg/dL — ABNORMAL LOW (ref 8.9–10.3)
Chloride: 91 mmol/L — ABNORMAL LOW (ref 98–111)
Creatinine, Ser: 0.38 mg/dL — ABNORMAL LOW (ref 0.44–1.00)
GFR, Estimated: >60 mL/min (ref >60)
Glucose, Bld: 100 mg/dL — ABNORMAL HIGH (ref 70–99)
Potassium: 3.6 mmol/L (ref 3.5–5.1)
Sodium: 127 mmol/L — ABNORMAL LOW (ref 135–145)

## 2023-11-05 LAB — PROTIME-INR
INR: 2.5 — ABNORMAL HIGH (ref 0.8–1.2)
Prothrombin Time: 27.4 s — ABNORMAL HIGH (ref 11.4–15.2)

## 2023-11-05 MED ORDER — ACETAMINOPHEN 325 MG PO TABS: 650.0000 mg | ORAL_TABLET | Freq: Four times a day (QID) | ORAL | Status: AC | PRN

## 2023-11-05 MED ORDER — FUROSEMIDE 40 MG PO TABS: 40.0000 mg | ORAL_TABLET | Freq: Every day | ORAL | 0 refills | Status: AC

## 2023-11-05 MED ORDER — SACUBITRIL-VALSARTAN 24-26 MG PO TABS: 1.0000 | ORAL_TABLET | Freq: Two times a day (BID) | ORAL | 0 refills | Status: DC

## 2023-11-05 MED ORDER — METOPROLOL SUCCINATE ER 25 MG PO TB24: 125.0000 mg | ORAL_TABLET | Freq: Every day | ORAL | Status: DC

## 2023-11-05 MED ORDER — WARFARIN SODIUM 5 MG PO TABS
5.0000 mg | ORAL_TABLET | Freq: Once | ORAL | Status: DC
  Filled 2023-11-05: qty 1

## 2023-11-05 MED ORDER — HYDROCODONE-ACETAMINOPHEN 5-325 MG PO TABS
1.0000 | ORAL_TABLET | Freq: Four times a day (QID) | ORAL | 0 refills | Status: DC | PRN
Start: 2023-11-05 — End: 2023-11-21

## 2023-11-05 MED ORDER — WARFARIN SODIUM 2.5 MG PO TABS: ORAL_TABLET | ORAL | 0 refills | Status: AC

## 2023-11-05 MED ORDER — GUAIFENESIN ER 600 MG PO TB12: 600.0000 mg | ORAL_TABLET | Freq: Two times a day (BID) | ORAL | Status: AC

## 2023-11-05 MED ORDER — METOPROLOL SUCCINATE ER 50 MG PO TB24: 125.0000 mg | ORAL_TABLET | Freq: Every day | ORAL | 0 refills | Status: AC

## 2023-11-05 MED ORDER — SPIRONOLACTONE 25 MG PO TABS: 25.0000 mg | ORAL_TABLET | Freq: Every day | ORAL | 0 refills | Status: DC

## 2023-11-05 NOTE — Progress Notes (Signed)
 Heart Failure Stewardship Pharmacy Note  PCP: Marisue Ivan, MD PCP-Cardiologist: Julien Nordmann, MD  HPI: Andrea Griffin is a 89 y.o. female with HTN, HLD, hyperthyroidism, nonobstructive CAD on cath November 2024, paroxysmal A-fib and left atrial appendage thrombus on Coumadin, cardiomyopathy (EF 40 to 45% on TEE 09/2023) who presented with slurred speech, weakness, and malaise. CT head and MR brain both negative for acute intracranial abnormality. CTA chest, abdomen, pelvis was negative for dissection/aneurysm, small left + moderate right effusion, and suspected edema. On admission, BNP was 818.6, HS-troponin was 14.. Chest x-ray noted vascular congestion with mild interstitial edema.    Pertinent cardiac history: Echo 05/2023 with LVEF 70-75%, grade II diastolic function. LHC 07/2023 with minimal CAD. Echo 07/2023 with LVEF 55-60%. Mild LVH. TEE 09/2023 with LVEF 40-45% with evidence of LA thrombus.  Pertinent Lab Values: Creatinine  Date Value Ref Range Status  03/28/2014 0.95 0.60 - 1.30 mg/dL Final   Creatinine, Ser  Date Value Ref Range Status  11/05/2023 0.38 (L) 0.44 - 1.00 mg/dL Final   BUN  Date Value Ref Range Status  11/05/2023 14 8 - 23 mg/dL Final  66/01/3015 13 7 - 18 mg/dL Final   Potassium  Date Value Ref Range Status  11/05/2023 3.6 3.5 - 5.1 mmol/L Final  03/28/2014 4.2 3.5 - 5.1 mmol/L Final   Sodium  Date Value Ref Range Status  11/05/2023 127 (L) 135 - 145 mmol/L Final  03/28/2014 137 136 - 145 mmol/L Final   B Natriuretic Peptide  Date Value Ref Range Status  10/31/2023 818.6 (H) 0.0 - 100.0 pg/mL Final    Comment:    Performed at [facility], [address] [place], [place] [address]   Magnesium  Date Value Ref Range Status  09/30/2023 1.5 (L) 1.7 - 2.4 mg/dL Final    Comment:    Performed at [facility], [address] [place], [place] [address]  05/03/2012 1.3 (L) mg/dL Final    Comment:    3.2-2.0 THERAPEUTIC  RANGE: 4-7 mg/dL TOXIC: > 10 mg/dL  -----------------------    Hgb A1c MFr Bld  Date Value Ref Range Status  11/01/2023 5.3 4.8 - 5.6 % Final    Comment:    (NOTE) Pre diabetes:          5.7%-6.4%  Diabetes:              >6.4%  Glycemic control for   <7.0% adults with diabetes    TSH  Date Value Ref Range Status  11/03/2023 2.236 0.350 - 4.500 uIU/mL Final    Comment:    Performed by a 3rd Generation assay with a functional sensitivity of <=0.01 uIU/mL. Performed at [facility], [address] [place], [place] [address]     Vital Signs:  Temp:  [97.6 F (36.4 C)-98.8 F (37.1 C)] 98.5 F (36.9 C) (03/07 0503) Pulse Rate:  [60-89] 68 (03/07 0503) Cardiac Rhythm: Atrial fibrillation (03/06 1900) Resp:  [17-19] 17 (03/07 0503) BP: (92-129)/(57-76) 112/71 (03/07 0503) SpO2:  [93 %-98 %] 94 % (03/07 0503) Weight:  [62.3 kg (137 lb 5.6 oz)] 62.3 kg (137 lb 5.6 oz) (03/07 0506)  Intake/Output Summary (Last 24 hours) at 11/05/2023 0741 Last data filed at 11/05/2023 0600 Gross per 24 hour  Intake 480 ml  Output 450 ml  Net 30 ml   Current Heart Failure Medications:  Loop diuretic: furosemide 40 mg daily Beta-Blocker: metoprolol succinate 125 mg daily ACEI/ARB/ARNI: Entresto 24-26 mg BID MRA:  spironolactone 12.5 mg daily SGLT2i: none  Prior to admission Heart Failure Medications:  Loop diuretic: none Beta-Blocker: metoprolol succinate 125 mg daily ACEI/ARB/ARNI: losartan 25 mg daily MRA: none SGLT2i: none  Assessment: 1. Acute on chronic combined systolic and diastolic heart failure (LVEF 40-45%) with history fo grade II diastolic dysfunction, due to NICM. NYHA class III symptoms.  -Symptoms: Reports shortness of breath with ambulating from bed to toilet, but does feel better overall.  -Volume: Appears euvolemic. Weight up today but still down from admission. Creatinine and BUN are stable. Poorly documented urine output documented.  -Hemodynamics: BP is  variable with the majority fo readings at goal. HR 70-80s. -ZO:[id number] metoprolol succinate 125 mg daily -ACEI/ARB/ARNI: Continue Entersto 24-26 mg BID. -MRA: Continue spironolactone to 25 mg daily. Should also help maintain normokalemia. -SGLT2i: Not a good candidate given recent UTI  Plan: 1) Medication changes recommended at this time: -Consider 40 meq potassium today  2) Patient assistance: -Entresto copay is $4  3) Education: - Patient has been educated on current HF medications and potential additions to HF medication regimen - Patient verbalizes understanding that over the next few months, these medication doses may change and more medications may be added to optimize HF regimen - Patient has been educated on basic disease state pathophysiology and goals of therapy  Medication Assistance / Insurance Benefits Check: Does the patient have prescription insurance?    Type of insurance plan:  Does the patient qualify for medication assistance through manufacturers or grants? No   Outpatient Pharmacy: Prior to admission outpatient pharmacy: [facility]   Is the patient willing to utilize a [facility] pharmacy at discharge?: Yes  Please do not hesitate to reach out with questions or concerns,  Enos Fling, PharmD, CPP, BCPS Heart Failure Pharmacist  Phone - [phone] 11/05/2023 7:41 AM

## 2023-11-05 NOTE — Plan of Care (Signed)
  Problem: Education: Goal: Knowledge of General Education information will improve Description: Including pain rating scale, medication(s)/side effects and non-pharmacologic comfort measures Outcome: Progressing   Problem: Health Behavior/Discharge Planning: Goal: Ability to manage health-related needs will improve Outcome: Progressing   Problem: Clinical Measurements: Goal: Ability to maintain clinical measurements within normal limits will improve Outcome: Progressing Goal: Diagnostic test results will improve Outcome: Progressing Goal: Respiratory complications will improve Outcome: Progressing Goal: Cardiovascular complication will be avoided Outcome: Progressing   Problem: Activity: Goal: Risk for activity intolerance will decrease Outcome: Progressing   Problem: Elimination: Goal: Will not experience complications related to urinary retention Outcome: Progressing   Problem: Pain Managment: Goal: General experience of comfort will improve and/or be controlled Outcome: Progressing   Problem: Safety: Goal: Ability to remain free from injury will improve Outcome: Progressing

## 2023-11-05 NOTE — Discharge Summary (Addendum)
 Physician Discharge Summary   Patient: Andrea Griffin MRN: [id number]  DOB: May 24, 1932   Admit:     Date of Admission: 10/31/2023 Admitted from: home   Discharge: Date of discharge: 11/05/23 Disposition: Home health Condition at discharge: good  CODE STATUS: DNR     Discharge Physician: Sunnie Nielsen, DO [facility]     PCP: Marisue Ivan, MD  Recommendations for Outpatient Follow-up:  Follow up with PCP Marisue Ivan, MD in 2-4 weeks, sooner as needed if hip pain is not improving w/ PT  Follow up with cardiology Dr Corky Sing in 1 week  Please obtain labs/tests: INR in one week    Discharge Instructions     (HEART FAILURE PATIENTS) Call MD:  Anytime you have any of the following symptoms: 1) 3 pound weight gain in 24 hours or 5 pounds in 1 week 2) shortness of breath, with or without a dry hacking cough 3) swelling in the hands, feet or stomach 4) if you have to sleep on extra pillows at night in order to breathe.   Complete by: As directed    Diet - low sodium heart healthy   Complete by: As directed    Increase activity slowly   Complete by: As directed          Discharge Diagnoses: Principal Problem:   Acute on chronic HFrEF (heart failure with reduced ejection fraction) (HCC) Active Problems:   NICM , EF 40-45% 09/2023(nonischemic cardiomyopathy) (HCC)   Slurred speech   Hyponatremia   Persistent atrial fibrillation (HCC)   Warfarin anticoagulation   Thrombus of left atrial appendage   Nonobstructive CAD on [facility] 07/2023   Frailty/ physical deconditioning   Generalized weakness   Hyperthyroidism   Essential hypertension   CHF exacerbation [facility])       Hospital course / significant events:   HPI: Andrea Griffin is a 89 y.o. female with medical history significant for  HTN, HLD, hyperthyroidism, nonobstructive CAD on cath November 2024, paroxysmal A-fib and left atrial appendage thrombus on Coumadin, cardiomyopathy (EF 40 to 45%  on TEE 09/2023) last evaluated by her cardiologist outpatient a couple weeks prior on 10/18/2023 when she had ongoing exertional dyspnea and palpitations and left chest discomfort attributed to symptomatic persistent A-fib for which GDMT was adjusted(losartan added), who was brought to the ED with a complaint of slurred speech on awaking this morning as well as a several week history of weakness, malaise and generally feeling unwell.    03/02: to ED, admitted to hospitalist w/ acute on chronic HFrEF, acute respiratory failure d/t CHF and pleural effusion, question TIA/CVA, chronic hyponatremia. CT/MRI negative for CVA. 03/03: cardiology consulted, advise continue diuresis which pt was not on prior to hospitalization, consider add spironolactone, no further diagnostics  03/04: conitnuing IV diuresis 03/05: pt c/o more SOB today, reviewed CT images which did show significant effusion, US thoracentesis ordered if enough fluid to pull based on [place], IR eval and effusion had reduced sufficiently that no thoracentesis was performed  03/06: still O2 requirement, pt is relctant for home oxygen, failed walk test today, will recheck tomorrow, if otherwise stable can likely plan on dc home w/ O2, if worse will stay here  03/07: ambulating well on room air without desaturation      Consultants:  Cardiology   Procedures/Surgeries: none      ASSESSMENT & PLAN:  Acute on chronic HFrEF (heart failure with reduced ejection fraction)  NICM, EF 40 to 45% on TEE  09/2023 Right pleural effusion/anasarca Continue po Lasix - see med rec  Monitor daily weight at home GDMT with metoprolol and losartan, spironolactone. NO SGLT2 d/t frequent UTI Cardiologist following    Acute stroke ruled out Possible TIA MRI of the brain did not show any acute intracranial pathology PT OT eval  Nonobstructive CAD on [facility] 07/2023 Patient with occasional chest pain.  Troponin normal.  EKG nonacute Continue metoprolol, losartan,  warfarin   Persistent atrial fibrillation  Thrombus left atrial appendage Warfarin anticoagulation, subtherapeutic at 1.9 on arrival, now at 2.6 Currently rate controlled with metoprolol Coumadin    Hyponatremia Chronic.  Likely hypervolemic Expecting improvement with fluid restriction and diuretics Monitor BMP   Frailty/ physical deconditioning Generalized weakness Patient with advanced age, multiple comorbidities, recent hospitalizations PT/OT home health   Hyperthyroidism Continue methimazole  Follow outpatient    Essential hypertension Continue metoprolol  Dc losartan and started entresto Lasix             Discharge Instructions  Allergies as of 11/05/2023       Reactions   Ciprofloxacin Hives   Adhesive [tape]    Pulls off skin   Nitrofurantoin Other (See Comments)   Can not remember reaction   Phenazopyridine Other (See Comments)   Pt doesn't remember   Ranitidine Other (See Comments)   Can not remember reaction   Sulfa Antibiotics Other (See Comments)   Can not remember reaction        Medication List     STOP taking these medications    amoxicillin 500 MG capsule Commonly known as: AMOXIL   apixaban 5 MG Tabs tablet Commonly known as: ELIQUIS   losartan 25 MG tablet Commonly known as: COZAAR   ondansetron 4 MG disintegrating tablet Commonly known as: ZOFRAN-ODT       TAKE these medications    acetaminophen 325 MG tablet Commonly known as: TYLENOL Take 2 tablets (650 mg total) by mouth every 6 (six) hours as needed for mild pain (pain score 1-3), fever or headache (or Fever >/= 101). What changed:  medication strength how much to take when to take this reasons to take this   atorvastatin 40 MG tablet Commonly known as: LIPITOR Take 1 tablet (40 mg total) by mouth daily.   BLUE-EMU PAIN RELIEF DRY EX Apply 1 application  topically 2 (two) times daily as needed (Back pain).   Calcium 600+D 600-800 MG-UNIT Tabs Generic  drug: Calcium Carb-Cholecalciferol Take 2 tablets by mouth in the morning.   escitalopram 5 MG tablet Commonly known as: LEXAPRO Take 5 mg by mouth daily.   fexofenadine 180 MG tablet Commonly known as: ALLEGRA Take 180 mg by mouth daily.   fluticasone 50 MCG/ACT nasal spray Commonly known as: FLONASE Place 2 sprays into both nostrils daily.   Fluticasone-Salmeterol 100-50 MCG/DOSE Aepb Commonly known as: ADVAIR Inhale 1 puff into the lungs every 12 (twelve) hours.   furosemide 40 MG tablet Commonly known as: LASIX Take 1 tablet (40 mg total) by mouth daily. Increase to 1 tablet (40 mg total) by mouth TWICE daily (total daily dose 80 mg) as needed for up to 2 days for increased leg swelling, shortness of breath, weight gain 5+ lbs over 1-2 days. Seek medical care if these symptoms are not improving with increased dose. Start taking on: November 06, 2023   guaiFENesin 600 MG 12 hr tablet Commonly known as: MUCINEX Take 1 tablet (600 mg total) by mouth 2 (two) times daily.   HYDROcodone-acetaminophen  5-325 MG tablet Commonly known as: NORCO/VICODIN Take 1-2 tablets by mouth every 6 (six) hours as needed for moderate pain (pain score 4-6) or severe pain (pain score 7-10).   methimazole 5 MG tablet Commonly known as: TAPAZOLE Take 2.5 mg by mouth daily.   metoprolol succinate 50 MG 24 hr tablet Commonly known as: TOPROL-XL Take 2.5 tablets (125 mg total) by mouth daily. Take with or immediately following a meal. Start taking on: November 06, 2023 What changed:  medication strength how much to take when to take this Another medication with the same name was removed. Continue taking this medication, and follow the directions you see here.   omeprazole 20 MG capsule Commonly known as: PRILOSEC Take 20 mg by mouth 2 (two) times daily.   sacubitril-valsartan 24-26 MG Commonly known as: ENTRESTO Take 1 tablet by mouth 2 (two) times daily.   senna-docusate 8.6-50 MG  tablet Commonly known as: Senokot-S Take 1 tablet by mouth daily as needed for mild constipation.   spironolactone 25 MG tablet Commonly known as: ALDACTONE Take 1 tablet (25 mg total) by mouth daily. Start taking on: November 06, 2023   warfarin 2.5 MG tablet Commonly known as: COUMADIN 2.5 mg (one tablet) on Tue/Thurs/Sat, and 5 mg (two tablets) on Mon, Wed, Fri, and Sun (Total weekly dose: 27.5) What changed:  how much to take how to take this when to take this additional instructions         Follow-up Information     Alluri, Meryl Dare, MD. Go in 1 week(s).   Specialty: Cardiology Contact information: [address] [place] [address] [phone]                 Allergies  Allergen Reactions   Ciprofloxacin Hives   Adhesive [Tape]     Pulls off skin   Nitrofurantoin Other (See Comments)    Can not remember reaction   Phenazopyridine Other (See Comments)    Pt doesn't remember   Ranitidine Other (See Comments)    Can not remember reaction   Sulfa Antibiotics Other (See Comments)    Can not remember reaction     Subjective: pt reports feeling well this monring, breathing is a bit better, no other concerns. No chest pain. Ambulating well on room air. Some hip pain persisting, requesting pain meds on discharge    Discharge Exam: BP 139/68 (BP Location: Right Arm)   Pulse 77   Temp 98.5 F (36.9 C) (Oral)   Resp 19   Ht 5\' 1"  (1.549 m)   Wt 62.3 kg   SpO2 93%   BMI 25.95 kg/m  General: Pt is alert, awake, not in acute distress Cardiovascular: RRR Respiratory: CTA execpt very mild rales at low bases  Abdominal: Soft, NT, ND, bowel sounds + Extremities: no edema, no cyanosis     The results of significant diagnostics from this hospitalization (including imaging, microbiology, ancillary and laboratory) are listed below for reference.     Microbiology: Recent Results (from the past 240 hours)  Resp panel by RT-PCR (RSV, Flu A&B,  Covid) Anterior Nasal Swab     Status: None   Collection Time: 10/31/23  3:45 PM   Specimen: Anterior Nasal Swab  Result Value Ref Range Status   SARS Coronavirus 2 by RT PCR NEGATIVE NEGATIVE Final    Comment: (NOTE) SARS-CoV-2 target nucleic acids are NOT DETECTED.  The SARS-CoV-2 RNA is generally detectable in upper respiratory specimens during the acute phase of infection.  The lowest concentration of SARS-CoV-2 viral copies this assay can detect is 138 copies/mL. A negative result does not preclude SARS-Cov-2 infection and should not be used as the sole basis for treatment or other patient management decisions. A negative result may occur with  improper specimen collection/handling, submission of specimen other than nasopharyngeal swab, presence of viral mutation(s) within the areas targeted by this assay, and inadequate number of viral copies(<138 copies/mL). A negative result must be combined with clinical observations, patient history, and epidemiological information. The expected result is Negative.  Fact Sheet for Patients:  BloggerCourse.com  Fact Sheet for Healthcare Providers:  SeriousBroker.it  This test is no t yet approved or cleared by the [place] FDA and  has been authorized for detection and/or diagnosis of SARS-CoV-2 by FDA under an Emergency Use Authorization (EUA). This EUA will remain  in effect (meaning this test can be used) for the duration of the COVID-19 declaration under Section 564(b)(1) of the Act, 21 U.S.C.section 360bbb-3(b)(1), unless the authorization is terminated  or revoked sooner.       Influenza A by PCR NEGATIVE NEGATIVE Final   Influenza B by PCR NEGATIVE NEGATIVE Final    Comment: (NOTE) The Xpert Xpress SARS-CoV-2/FLU/RSV plus assay is intended as an aid in the diagnosis of influenza from Nasopharyngeal swab specimens and should not be used as a sole basis for treatment. Nasal  washings and aspirates are unacceptable for Xpert Xpress SARS-CoV-2/FLU/RSV testing.  Fact Sheet for Patients: BloggerCourse.com  Fact Sheet for Healthcare Providers: SeriousBroker.it  This test is not yet approved or cleared by the [place] FDA and has been authorized for detection and/or diagnosis of SARS-CoV-2 by FDA under an Emergency Use Authorization (EUA). This EUA will remain in effect (meaning this test can be used) for the duration of the COVID-19 declaration under Section 564(b)(1) of the Act, 21 U.S.C. section 360bbb-3(b)(1), unless the authorization is terminated or revoked.     Resp Syncytial Virus by PCR NEGATIVE NEGATIVE Final    Comment: (NOTE) Fact Sheet for Patients: BloggerCourse.com  Fact Sheet for Healthcare Providers: SeriousBroker.it  This test is not yet approved or cleared by the [place] FDA and has been authorized for detection and/or diagnosis of SARS-CoV-2 by FDA under an Emergency Use Authorization (EUA). This EUA will remain in effect (meaning this test can be used) for the duration of the COVID-19 declaration under Section 564(b)(1) of the Act, 21 U.S.C. section 360bbb-3(b)(1), unless the authorization is terminated or revoked.  Performed at [facility], [address] [place], [place] [address]      Labs: BNP (last 3 results) Recent Labs    10/31/23 1545  BNP 818.6*   Basic Metabolic Panel: Recent Labs  Lab 11/01/23 0451 11/02/23 0423 11/03/23 0436 11/04/23 0410 11/05/23 0442  NA 127* 129* 128* 128* 127*  K 3.5 3.2* 3.8 3.5 3.6  CL 92* 90* 92* 91* 91*  CO2 26 29 28 27 27   GLUCOSE 86 78 80 95 100*  BUN 10 10 12 12 14   CREATININE 0.56 0.39* 0.42* 0.43* 0.38*  CALCIUM 8.4* 8.6* 8.5* 8.5* 8.5*   Liver Function Tests: Recent Labs  Lab 10/31/23 1545  AST 29  ALT 22  ALKPHOS 79  BILITOT 1.5*  PROT 7.2   ALBUMIN 3.6   No results for input(s): "LIPASE", "AMYLASE" in the last 168 hours. No results for input(s): "AMMONIA" in the last 168 hours. CBC: Recent Labs  Lab 10/31/23 1545 11/01/23 0451 11/02/23 0423 11/03/23  0981 11/04/23 0410 11/05/23 0442  WBC 8.4 8.2 7.7 8.0 8.8 10.2  NEUTROABS 5.7  --   --   --   --   --   HGB 12.2 10.9* 11.5* 11.8* 12.5 13.0  HCT 37.1 32.9* 34.5* 34.0* 36.6 38.5  MCV 91.8 91.9 89.8 88.1 86.9 88.1  PLT 342 274 298 304 362 349   Cardiac Enzymes: No results for input(s): "CKTOTAL", "CKMB", "CKMBINDEX", "TROPONINI" in the last 168 hours. BNP: Invalid input(s): "POCBNP" CBG: Recent Labs  Lab 10/31/23 1500  GLUCAP 121*   D-Dimer No results for input(s): "DDIMER" in the last 72 hours. Hgb A1c No results for input(s): "HGBA1C" in the last 72 hours. Lipid Profile No results for input(s): "CHOL", "HDL", "LDLCALC", "TRIG", "CHOLHDL", "LDLDIRECT" in the last 72 hours. Thyroid function studies Recent Labs    11/03/23 0435  TSH 2.236   Anemia work up No results for input(s): "VITAMINB12", "FOLATE", "FERRITIN", "TIBC", "IRON", "RETICCTPCT" in the last 72 hours. Urinalysis    Component Value Date/Time   COLORURINE YELLOW (A) 10/31/2023 1545   APPEARANCEUR CLEAR (A) 10/31/2023 1545   APPEARANCEUR Clear 03/13/2014 1403   LABSPEC 1.018 10/31/2023 1545   LABSPEC 1.030 03/13/2014 1403   PHURINE 8.0 10/31/2023 1545   GLUCOSEU NEGATIVE 10/31/2023 1545   GLUCOSEU Negative 03/13/2014 1403   HGBUR NEGATIVE 10/31/2023 1545   BILIRUBINUR NEGATIVE 10/31/2023 1545   BILIRUBINUR Negative 03/13/2014 1403   KETONESUR NEGATIVE 10/31/2023 1545   PROTEINUR 100 (A) 10/31/2023 1545   NITRITE NEGATIVE 10/31/2023 1545   LEUKOCYTESUR NEGATIVE 10/31/2023 1545   LEUKOCYTESUR Negative 03/13/2014 1403   Sepsis Labs Recent Labs  Lab 11/02/23 0423 11/03/23 0436 11/04/23 0410 11/05/23 0442  WBC 7.7 8.0 8.8 10.2   Microbiology Recent Results (from the past 240  hours)  Resp panel by RT-PCR (RSV, Flu A&B, Covid) Anterior Nasal Swab     Status: None   Collection Time: 10/31/23  3:45 PM   Specimen: Anterior Nasal Swab  Result Value Ref Range Status   SARS Coronavirus 2 by RT PCR NEGATIVE NEGATIVE Final    Comment: (NOTE) SARS-CoV-2 target nucleic acids are NOT DETECTED.  The SARS-CoV-2 RNA is generally detectable in upper respiratory specimens during the acute phase of infection. The lowest concentration of SARS-CoV-2 viral copies this assay can detect is 138 copies/mL. A negative result does not preclude SARS-Cov-2 infection and should not be used as the sole basis for treatment or other patient management decisions. A negative result may occur with  improper specimen collection/handling, submission of specimen other than nasopharyngeal swab, presence of viral mutation(s) within the areas targeted by this assay, and inadequate number of viral copies(<138 copies/mL). A negative result must be combined with clinical observations, patient history, and epidemiological information. The expected result is Negative.  Fact Sheet for Patients:  BloggerCourse.com  Fact Sheet for Healthcare Providers:  SeriousBroker.it  This test is no t yet approved or cleared by the [place] FDA and  has been authorized for detection and/or diagnosis of SARS-CoV-2 by FDA under an Emergency Use Authorization (EUA). This EUA will remain  in effect (meaning this test can be used) for the duration of the COVID-19 declaration under Section 564(b)(1) of the Act, 21 U.S.C.section 360bbb-3(b)(1), unless the authorization is terminated  or revoked sooner.       Influenza A by PCR NEGATIVE NEGATIVE Final   Influenza B by PCR NEGATIVE NEGATIVE Final    Comment: (NOTE) The Xpert Xpress SARS-CoV-2/FLU/RSV plus assay is intended  as an aid in the diagnosis of influenza from Nasopharyngeal swab specimens and should not  be used as a sole basis for treatment. Nasal washings and aspirates are unacceptable for Xpert Xpress SARS-CoV-2/FLU/RSV testing.  Fact Sheet for Patients: BloggerCourse.com  Fact Sheet for Healthcare Providers: SeriousBroker.it  This test is not yet approved or cleared by the [place] FDA and has been authorized for detection and/or diagnosis of SARS-CoV-2 by FDA under an Emergency Use Authorization (EUA). This EUA will remain in effect (meaning this test can be used) for the duration of the COVID-19 declaration under Section 564(b)(1) of the Act, 21 U.S.C. section 360bbb-3(b)(1), unless the authorization is terminated or revoked.     Resp Syncytial Virus by PCR NEGATIVE NEGATIVE Final    Comment: (NOTE) Fact Sheet for Patients: BloggerCourse.com  Fact Sheet for Healthcare Providers: SeriousBroker.it  This test is not yet approved or cleared by the [place] FDA and has been authorized for detection and/or diagnosis of SARS-CoV-2 by FDA under an Emergency Use Authorization (EUA). This EUA will remain in effect (meaning this test can be used) for the duration of the COVID-19 declaration under Section 564(b)(1) of the Act, 21 U.S.C. section 360bbb-3(b)(1), unless the authorization is terminated or revoked.  Performed at [facility], [address], [place], [place] [address]    Imaging MR BRAIN WO CONTRAST Result Date: 11/01/2023 CLINICAL DATA:  Initial evaluation for acute neuro deficit, stroke suspected. EXAM: MRI HEAD WITHOUT CONTRAST TECHNIQUE: Multiplanar, multiecho pulse sequences of the brain and surrounding structures were obtained without intravenous contrast. COMPARISON:  Prior CT from earlier the same day. FINDINGS: Brain: Cerebral volume within normal limits. Patchy T2/FLAIR hyperintensity involving the periventricular white matter, most  characteristic of chronic microvascular ischemic disease, mild for age. No evidence for acute or subacute ischemia. No areas of chronic cortical infarction. No acute or chronic intracranial blood products. No mass lesion, midline shift or mass effect. No hydrocephalus or extra-axial fluid collection. Pituitary gland and suprasellar region within normal limits. Vascular: Major intracranial vascular flow voids are maintained. Skull and upper cervical spine: Craniocervical junction within normal limits. Bone marrow signal intensity normal. No scalp soft tissue abnormality. Sinuses/Orbits: Prior bilateral ocular lens replacement. Chronic left maxillary sinusitis noted. Paranasal sinuses are otherwise largely clear. Trace bilateral mastoid effusions noted, of doubtful significance. Other: None. IMPRESSION: 1. No acute intracranial abnormality. 2. Mild chronic microvascular ischemic disease for age. 3. Chronic left maxillary sinusitis. Electronically Signed   By: Rise Mu M.D.   On: 11/01/2023 00:04   CT Angio Chest/Abd/Pel for Dissection W and/or W/WO Result Date: 10/31/2023 CLINICAL DATA:  Difficulty speaking altered EXAM: CT ANGIOGRAPHY CHEST, ABDOMEN AND PELVIS TECHNIQUE: Non-contrast CT of the chest was initially obtained. Multidetector CT imaging through the chest, abdomen and pelvis was performed using the standard protocol during bolus administration of intravenous contrast. Multiplanar reconstructed images and MIPs were obtained and reviewed to evaluate the vascular anatomy. RADIATION DOSE REDUCTION: This exam was performed according to the departmental dose-optimization program which includes automated exposure control, adjustment of the mA and/or kV according to patient size and/or use of iterative reconstruction technique. CONTRAST:  OMNIPAQUE IOHEXOL 350 MG/ML SOLN COMPARISON:  Chest x-ray 10/31/2023, CT 07/14/2023, 05/01/2012, chest CT 05/06/2023 FINDINGS: CTA CHEST FINDINGS  Cardiovascular: Non contrasted images of the chest demonstrate no acute intramural hematoma. Negative for aortic dissection or aneurysm. Cardiomegaly with marked biatrial enlargement. No significant pericardial effusion. Mediastinum/Nodes: Patent trachea. No suspicious thyroid mass. Subcentimeter mediastinal lymph nodes.  Esophagus within normal limits Lungs/Pleura: Subpleural reticulation consistent with pulmonary fibrosis. Small left and moderate right pleural effusions. Mild diffuse hazy lung attenuation likely due to edema. Stable scattered small pulmonary nodules measuring up to 4 mm at the superior left lower lobe on series 7, image 49 for which no additional imaging follow-up is recommended. Musculoskeletal: Sternum is intact.  No acute osseous abnormality Review of the MIP images confirms the above findings. CTA ABDOMEN AND PELVIS FINDINGS VASCULAR Aorta: Normal caliber aorta without aneurysm, dissection, vasculitis or significant stenosis. Celiac: Patent without evidence of aneurysm, dissection, vasculitis or significant stenosis. Left gastric artery arises directly from the aorta, superior to the celiac trunk. SMA: Patent without evidence of aneurysm, dissection, vasculitis or significant stenosis. Renals: Both renal arteries are patent without evidence of aneurysm, dissection, vasculitis, fibromuscular dysplasia or significant stenosis. IMA: Patent without evidence of aneurysm, dissection, vasculitis or significant stenosis. Inflow: Patent without evidence of aneurysm, dissection, vasculitis or significant stenosis. Veins: Suboptimally assessed Review of the MIP images confirms the above findings. NON-VASCULAR Hepatobiliary: Cholecystectomy. No biliary dilatation. Question mild surface nodularity of the liver Pancreas: Unremarkable. No pancreatic ductal dilatation or surrounding inflammatory changes. Spleen: Normal in size without focal abnormality. Adrenals/Urinary Tract: Adrenal glands are normal.  Kidneys show no hydronephrosis. Cyst mid to upper pole left kidney for which no imaging follow-up is recommended. The bladder is obscured by artifact Stomach/Bowel: Stomach nonenlarged. No dilated small bowel. No acute bowel wall thickening. Diverticular disease of the sigmoid colon. Negative appendix Lymphatic: No suspicious lymph nodes. Reproductive: Largely obscured by artifact.  No obvious adnexal mass Other: Negative for pelvic effusion or free air Musculoskeletal: Generalized subcutaneous edema. Bilateral hip replacements with artifact. Posterior spinal fusion hardware at L4-L5. Sacral Tarlov cysts. Review of the MIP images confirms the above findings. IMPRESSION: 1. Negative for acute aortic dissection or aneurysm. No significant vascular disease within the abdomen or pelvis 2. Small left and moderate right pleural effusions. Cardiomegaly with diffuse hazy pulmonary densities suspected to represent edema 3. Question surface nodularity of the liver as may be seen with cirrhosis, correlate for risk factors 4. No CT evidence for acute intra-abdominal or pelvic abnormality 5. Generalized subcutaneous edema consistent with anasarca 6. Diverticular disease of the sigmoid colon without acute inflammation Electronically Signed   By: Jasmine Pang M.D.   On: 10/31/2023 18:30   DG Chest Portable 1 View Result Date: 10/31/2023 CLINICAL DATA:  Altered mental status EXAM: PORTABLE CHEST 1 VIEW COMPARISON:  10/01/2023 FINDINGS: Cardiomegaly with vascular congestion and mild diffuse interstitial opacity suspicious for edema. Probable small pleural effusions. No pneumothorax IMPRESSION: Cardiomegaly with vascular congestion and mild interstitial edema. Probable small effusions Electronically Signed   By: Jasmine Pang M.D.   On: 10/31/2023 16:07   CT HEAD WO CONTRAST Result Date: 10/31/2023 CLINICAL DATA:  Neuro deficit.  Evaluate for acute stroke. EXAM: CT HEAD WITHOUT CONTRAST TECHNIQUE: Contiguous axial images were  obtained from the base of the skull through the vertex without intravenous contrast. RADIATION DOSE REDUCTION: This exam was performed according to the departmental dose-optimization program which includes automated exposure control, adjustment of the mA and/or kV according to patient size and/or use of iterative reconstruction technique. COMPARISON:  None Available. FINDINGS: Brain: No evidence of acute infarction, hemorrhage, hydrocephalus, extra-axial collection or mass lesion/mass effect. Vascular: No hyperdense vessel or unexpected calcification. Skull: Normal. Negative for fracture or focal lesion. Sinuses/Orbits: There is opacification of the visualized portions of the left maxillary sinus. The remaining paranasal sinuses are  clear. Other: None. IMPRESSION: 1. No acute intracranial abnormalities. 2. Left maxillary sinus disease. Electronically Signed   By: Signa Kell M.D.   On: 10/31/2023 15:49      Time coordinating discharge: over 30 minutes  SIGNED:  Sunnie Nielsen DO [facility]

## 2023-11-05 NOTE — Progress Notes (Signed)
 Physical Therapy Treatment Patient Details Name: Andrea Griffin MRN: [id number] DOB: September 27, 1931 Today's Date: 11/05/2023   History of Present Illness Pt is an 89 year old female admitted with  acute decompensation of congestive heart failure requiring admission, right pleural effusion, possible TIA     PMH significant for TN, HLD, hyperthyroidism, nonobstructive CAD on cath November 2024, paroxysmal A-fib and left atrial appendage thrombus on Coumadin, cardiomyopathy (EF 40 to 45% on TEE 09/2023    PT Comments  Pt is making progress with decrease assist for bed mobility (Mod I supine.sit) and supervision for transfers with RW, gait with RW (55'), and stair negotiation (3 steps).  Pt did report increased pain in LLE with movement, may need some assist sit> supine for LEs.  Right after gait, SPO2 91% HR 77bpm .  Continued PT will assist pt towards greater LE strength, functional standing balance, and increased activity tolerance for safety and independence with mobility.   If plan is discharge home, recommend the following: A little help with walking and/or transfers;A little help with bathing/dressing/bathroom;Assistance with cooking/housework;Assist for transportation;Help with stairs or ramp for entrance   Can travel by private vehicle        Equipment Recommendations  None recommended by PT    Recommendations for Other Services       Precautions / Restrictions Precautions Precautions: Fall Recall of Precautions/Restrictions: Intact Restrictions Weight Bearing Restrictions Per Provider Order: No     Mobility  Bed Mobility Overal bed mobility: Needs Assistance Bed Mobility: Supine to Sit     Supine to sit: Modified independent (Device/Increase time)     General bed mobility comments: increased pain in LLE with movement, may need some assist sit> supine for LEs.  SPO2 91% HR 77bpm with activity on RA.    Transfers Overall transfer level: Needs assistance Equipment used:  Straight cane, Rolling walker (2 wheels) Transfers: Sit to/from Stand, Bed to chair/wheelchair/BSC Sit to Stand: Supervision   Step pivot transfers: Supervision       General transfer comment: Pt more steady with RW during transfers 2/2 increase use of bil UEs 2/2 LLE pain.    Ambulation/Gait Ambulation/Gait assistance: Supervision Gait Distance (Feet): 55 Feet Assistive device: Rolling walker (2 wheels) Gait Pattern/deviations: Step-through pattern, Decreased stride length, Trendelenburg, Antalgic Gait velocity: decr.         Stairs Stairs: Yes Stairs assistance: Supervision Stair Management: One rail Left, Step to pattern, Sideways Number of Stairs: 3 General stair comments: steady, facing rail for bil UEs support.   Wheelchair Mobility     Tilt Bed    Modified Rankin (Stroke Patients Only)       Balance Overall balance assessment: Needs assistance Sitting-balance support: Feet supported Sitting balance-Leahy Scale: Good     Standing balance support: During functional activity, Bilateral upper extremity supported, Reliant on assistive device for balance Standing balance-Leahy Scale: Fair Standing balance comment: RW use                            Communication Communication Communication: No apparent difficulties  Cognition Arousal: Alert Behavior During Therapy: WFL for tasks assessed/performed   PT - Cognitive impairments: No apparent impairments                         Following commands: Intact      Cueing Cueing Techniques: Verbal cues  Exercises Total Joint Exercises Quad Sets: AROM, Both, 10 reps  General Comments        Pertinent Vitals/Pain Pain Assessment Pain Assessment: 0-10 Pain Score: 6  Pain Location: L hip pain Pain Descriptors / Indicators: Aching, Sore Pain Intervention(s): Monitored during session, Patient requesting pain meds-RN notified    Home Living                           Prior Function            PT Goals (current goals can now be found in the care plan section) Acute Rehab PT Goals Patient Stated Goal: to feel better PT Goal Formulation: With patient Time For Goal Achievement: 11/17/23 Potential to Achieve Goals: Good Progress towards PT goals: Progressing toward goals    Frequency    Min 2X/week      PT Plan      Co-evaluation              AM-PAC PT "6 Clicks" Mobility   Outcome Measure  Help needed turning from your back to your side while in a flat bed without using bedrails?: None Help needed moving from lying on your back to sitting on the side of a flat bed without using bedrails?: None Help needed moving to and from a bed to a chair (including a wheelchair)?: A Little Help needed standing up from a chair using your arms (e.g., wheelchair or bedside chair)?: A Little Help needed to walk in hospital room?: A Little Help needed climbing 3-5 steps with a railing? : A Little 6 Click Score: 20    End of Session Equipment Utilized During Treatment: Gait belt Activity Tolerance: Patient tolerated treatment well Patient left: in chair;with chair alarm set;with call bell/phone within reach;with nursing/sitter in room Nurse Communication: Mobility status PT Visit Diagnosis: Other abnormalities of gait and mobility (R26.89);Difficulty in walking, not elsewhere classified (R26.2);Muscle weakness (generalized) (M62.81)     Time: 9518-8416 PT Time Calculation (min) (ACUTE ONLY): 28 min  Charges:    $Gait Training: 8-22 mins $Therapeutic Activity: 8-22 mins PT General Charges $$ ACUTE PT VISIT: 1 Visit                    Hortencia Conradi, PTA  11/05/23, 9:33 AM

## 2023-11-05 NOTE — Progress Notes (Signed)
 PHARMACY - ANTICOAGULATION CONSULT NOTE  Pharmacy Consult for warfarin  Indication: atrial fibrillation and atrial thrombus  Allergies  Allergen Reactions   Ciprofloxacin Hives   Adhesive [Tape]     Pulls off skin   Nitrofurantoin Other (See Comments)    Can not remember reaction   Phenazopyridine Other (See Comments)    Pt doesn't remember   Ranitidine Other (See Comments)    Can not remember reaction   Sulfa Antibiotics Other (See Comments)    Can not remember reaction    Patient Measurements: Height: 5\' 1"  (154.9 cm) Weight: 62.3 kg (137 lb 5.6 oz) IBW/kg (Calculated) : 47.8  Vital Signs: Temp: 98.5 F (36.9 C) (03/07 0503) Temp Source: Oral (03/07 0503) BP: 112/71 (03/07 0503) Pulse Rate: 68 (03/07 0503)  Labs: Recent Labs    11/03/23 0436 11/04/23 0410 11/05/23 0442  HGB 11.8* 12.5 13.0  HCT 34.0* 36.6 38.5  PLT 304 362 349  LABPROT 27.9* 26.4* 27.4*  INR 2.6* 2.4* 2.5*  CREATININE 0.42* 0.43* 0.38*    Estimated Creatinine Clearance: 40.3 mL/min (A) (by C-G formula based on SCr of 0.38 mg/dL (L)).   Medical History: Past Medical History:  Diagnosis Date   Anxiety    Arthritis    Asthma    Cancer (HCC)    melanoma skin cancer on head   Diastolic dysfunction    a. 05/2023 Echo: EF 70-76^, no rwma, GrII DD, nl RV fxn, mildly dil RA, mod dil LA. Mild MR. Mod AI.   GERD (gastroesophageal reflux disease)    History of recurrent cystitis    Hyperlipidemia    Hypertension    Infection of prosthetic hip joint (HCC)    Mitral regurgitation    Moderate aortic insufficiency    Nocturia    Obesity    Osteopenia    Persistent atrial fibrillation (HCC)    Thyroid disease    Urinary frequency    Urinary incontinence    Urinary urgency    Wears glasses    Assessment: 89 y/o female presenting with slurred speech. PMH significant for afib and left atrial thrombus (seen on ECHO from 09/2023) on warfarin PTA (recently started in 09/2023), HTN, HLD, and  asthma. Pharmacy has been consulted to resume PTA warfarin.  Warfarin regimen: 2.5 mg on Tue/Thurs/Sat and 5 mg on Mon, Wed, Fri, and Sun (Total weekly dose: 27.5) Last dose: 2.5 mg on 10/30/23   Goal of Therapy:  INR 2.0-3.0 for afib , targeting 2.5-3.0   (per granddaughter, cardiologist at [facility] is targeting higher end of range due to left atrial thrombus) Monitor platelets by anticoagulation protocol: Yes   Date INR Warfarin Dose Comments  3/2 1.9 6mg  Admitted with slurred speech and AMS (possible TIA)  3/3 1.9 7.5 mg Start enoxaparin 1mg /kg q 12hours until INR >/=2.5  3/4 2.4 2.5 mg   3/5 2.6 5 mg   3/6 2.4 5 mg   3/7 2.5 5 mg    Plan:  INR is therapeutic. Will give warfarin 5 mg x 1 (home dose). Enoxaparin stopped. If INR trends down may need to restart. Daily INR. CBC at least every 3 days.   Discharge recommendation: continue home regimen with close outpatient follow up. May need to increase dose to 5 mg daily.   Paschal Dopp, PharmD, BCPS 11/05/2023 7:46 AM

## 2023-11-05 NOTE — Progress Notes (Signed)
 Patient ID: Andrea Griffin, female   DOB: 1931/12/15, 89 y.o.   MRN: [id number] [facility]    SUBJECTIVE: Shortness of breath somewhat improved atrial fibrillation rate controlled denies any leg swelling has been on low-dose supplemental oxygen but feels somewhat better after IV diuretic use.  Patient appears to have a decreasing heart function since about 6 months ago denies any significant blackout spells or syncope   Vitals:   11/05/23 0503 11/05/23 0506 11/05/23 0839 11/05/23 0914  BP: 112/71  139/68   Pulse: 68  77   Resp: 17  16 19   Temp: 98.5 F (36.9 C)  98.5 F (36.9 C)   TempSrc: Oral  Oral   SpO2: 94%  93%   Weight:  62.3 kg    Height:         Intake/Output Summary (Last 24 hours) at 11/05/2023 0946 Last data filed at 11/05/2023 0600 Gross per 24 hour  Intake 480 ml  Output 450 ml  Net 30 ml      PHYSICAL EXAM  General: Well developed, well nourished, in no acute distress HEENT:  Normocephalic and atramatic Neck:  No JVD.  Lungs: Clear bilaterally to auscultation and percussion. Heart: HRRR . Normal S1 and S2 without gallops or murmurs.  Abdomen: Bowel sounds are positive, abdomen soft and non-tender  Msk:  Back normal, normal gait. Normal strength and tone for age. Extremities: No clubbing, cyanosis or edema.   Neuro: Alert and oriented X 3. Psych:  Good affect, responds appropriately   LABS: Basic Metabolic Panel: Recent Labs    11/04/23 0410 11/05/23 0442  NA 128* 127*  K 3.5 3.6  CL 91* 91*  CO2 27 27  GLUCOSE 95 100*  BUN 12 14  CREATININE 0.43* 0.38*  CALCIUM 8.5* 8.5*   Liver Function Tests: No results for input(s): "AST", "ALT", "ALKPHOS", "BILITOT", "PROT", "ALBUMIN" in the last 72 hours. No results for input(s): "LIPASE", "AMYLASE" in the last 72 hours. CBC: Recent Labs    11/04/23 0410 11/05/23 0442  WBC 8.8 10.2  HGB 12.5 13.0  HCT 36.6 38.5  MCV 86.9 88.1  PLT 362 349   Cardiac Enzymes: No results for input(s):  "CKTOTAL", "CKMB", "CKMBINDEX", "TROPONINI" in the last 72 hours. BNP: Invalid input(s): "POCBNP" D-Dimer: No results for input(s): "DDIMER" in the last 72 hours. Hemoglobin A1C: No results for input(s): "HGBA1C" in the last 72 hours. Fasting Lipid Panel: No results for input(s): "CHOL", "HDL", "LDLCALC", "TRIG", "CHOLHDL", "LDLDIRECT" in the last 72 hours. Thyroid Function Tests: Recent Labs    11/03/23 0435  TSH 2.236   Anemia Panel: No results for input(s): "VITAMINB12", "FOLATE", "FERRITIN", "TIBC", "IRON", "RETICCTPCT" in the last 72 hours.  [place] CHEST (PLEURAL EFFUSION) Result Date: 11/03/2023 INDICATION: Shortness of breath, right pleural effusion EXAM: CHEST ULTRASOUND COMPARISON:  CTA CHEST 10/31/23 FINDINGS: Small right-sided pleural effusion. No significant pocket of fluid or percutaneous window to allow safe thoracentesis. Risks outweigh the benefits. IMPRESSION: Small RIGHT pleural effusion with no safe window for percutaneous access Performed by: Loyce Dys PA-C Electronically Signed   By: Judie Petit.  Shick M.D.   On: 11/03/2023 15:37     Echo mild reduced left ventricular function from January EF around 40 to 45%  TELEMETRY: Atrial fibrillation rate controlled nonspecific STTW  changes: 80  ASSESSMENT AND PLAN:  Principal Problem:   Acute on chronic HFrEF (heart failure with reduced ejection fraction) (HCC) Active Problems:   Hyponatremia   Essential hypertension   Hyperthyroidism  NICM , EF 40-45% 09/2023(nonischemic cardiomyopathy) (HCC)   Persistent atrial fibrillation (HCC)   Warfarin anticoagulation   Thrombus of left atrial appendage   Frailty/ physical deconditioning   Generalized weakness   Nonobstructive CAD on LHC 07/2023   Slurred speech   CHF exacerbation (HCC)    Plan Acute on chronic systolic congestive heart failure recommend aggressive GDMT Entresto spironolactone metoprolol consider adding [place] or Jardiance but the patient had significant  UTIs Continue IV diuresis with Lasix Anticoagulation with warfarin for atrial fibrillation Shortness of breath related to heart failure and A-fib continue diuresis consider inhalers follow BMP Nonischemic cardiomyopathy EF in the past around 40-45 would recommend repeat echocardiogram for further assessment Have the patient follow-up with cardiology as an outpatient  Alwyn Pea, MD,  11/05/2023 9:46 AM

## 2023-11-05 NOTE — TOC Transition Note (Signed)
 Transition of Care [facility]) - Discharge Note   Patient Details  Name: Andrea Griffin MRN: [id number] Date of Birth: [birth year]  Transition of Care [facility]) CM/SW Contact:  Truddie Hidden, RN Phone Number: 11/05/2023, 11:08 AM   Clinical Narrative:    Patient discharging home. RNCM spoke with her granddaughter. She will transport patient home today and has been advised [facility] will contact her regarding scheduling.   [place] from [place] notified of discharge.   TOC signing off.       Barriers to Discharge: Continued Medical Work up   Patient Goals and CMS Choice            Discharge Placement                       Discharge Plan and Services Additional resources added to the After Visit Summary for       Post Acute Care Choice:  (TBD)                               Social Drivers of Health (SDOH) Interventions SDOH Screenings   Food Insecurity: No Food Insecurity (11/02/2023)  Housing: Low Risk  (11/02/2023)  Transportation Needs: No Transportation Needs (11/02/2023)  Utilities: Not At Risk (11/02/2023)  Financial Resource Strain: Low Risk  (07/14/2023)   Received from [facility]  Social Connections: Socially Integrated (11/02/2023)  Tobacco Use: Low Risk  (10/31/2023)     Readmission Risk Interventions    11/02/2023    4:07 PM  Readmission Risk Prevention Plan  Transportation Screening Complete  PCP or Specialist Appt within 3-5 Days Complete  Social Work Consult for Recovery Care Planning/Counseling Complete  Palliative Care Screening Not Applicable  Medication Review [employer]) Complete

## 2023-11-21 ENCOUNTER — Emergency Department

## 2023-11-21 ENCOUNTER — Emergency Department
Admission: EM | Admit: 2023-11-21 | Discharge: 2023-11-21 | Disposition: A | Discharge status: Home / Self Care | Attending: Emergency Medicine | Admitting: Emergency Medicine

## 2023-11-21 ENCOUNTER — Other Ambulatory Visit: Payer: Self-pay

## 2023-11-21 DIAGNOSIS — S72115A Nondisplaced fracture of greater trochanter of left femur, initial encounter for closed fracture: Secondary | ICD-10-CM | POA: Diagnosis not present

## 2023-11-21 DIAGNOSIS — W010XXA Fall on same level from slipping, tripping and stumbling without subsequent striking against object, initial encounter: Secondary | ICD-10-CM | POA: Diagnosis not present

## 2023-11-21 DIAGNOSIS — R9082 White matter disease, unspecified: Secondary | ICD-10-CM | POA: Insufficient documentation

## 2023-11-21 DIAGNOSIS — S0990XA Unspecified injury of head, initial encounter: Secondary | ICD-10-CM | POA: Insufficient documentation

## 2023-11-21 DIAGNOSIS — W19XXXA Unspecified fall, initial encounter: Secondary | ICD-10-CM

## 2023-11-21 DIAGNOSIS — I4891 Unspecified atrial fibrillation: Secondary | ICD-10-CM | POA: Insufficient documentation

## 2023-11-21 DIAGNOSIS — M25552 Pain in left hip: Secondary | ICD-10-CM | POA: Diagnosis present

## 2023-11-21 DIAGNOSIS — Z7901 Long term (current) use of anticoagulants: Secondary | ICD-10-CM | POA: Diagnosis not present

## 2023-11-21 MED ORDER — HYDROCODONE-ACETAMINOPHEN 5-325 MG PO TABS: 1.0000 | ORAL_TABLET | ORAL | 0 refills | Status: DC | PRN

## 2023-11-21 NOTE — ED Provider Notes (Signed)
 [facility] Provider Note    Event Date/Time   First MD Initiated Contact with Patient 11/21/23 1731     (approximate)  History   Chief Complaint: Fall  HPI  Andrea Griffin is a 89 y.o. female with atrial fibrillation on warfarin who presents to the emergency department after a fall.  According to the patient she was at church when she lost her balance causing her to fall onto her left side.  Patient states pain in the left hip.  Patient has a history of a prior left hip as well as a right hip replacement.  Denies any other injuries.  Did not hit her head.  No LOC.  Physical Exam   Triage Vital Signs: ED Triage Vitals  Encounter Vitals Group     BP 11/21/23 1551 (!) 105/49     Systolic BP Percentile --      Diastolic BP Percentile --      Pulse Rate 11/21/23 1551 78     Resp 11/21/23 1551 17     Temp 11/21/23 1551 97.9 F (36.6 C)     Temp Source 11/21/23 1551 Oral     SpO2 11/21/23 1551 97 %     Weight 11/21/23 1552 133 lb (60.3 kg)     Height 11/21/23 1552 5\' 1"  (1.549 m)     Head Circumference --      Peak Flow --      Pain Score 11/21/23 1552 7     Pain Loc --      Pain Education --      Exclude from Growth Chart --     Most recent vital signs: Vitals:   11/21/23 1551  BP: (!) 105/49  Pulse: 78  Resp: 17  Temp: 97.9 F (36.6 C)  SpO2: 97%    General: Awake, no distress.  CV:  Good peripheral perfusion.  Regular rate and rhythm  Resp:  Normal effort.  Equal breath sounds bilaterally.  Abd:  No distention.  Other:  Mild left hip tenderness palpation, neurovascularly intact distally.   ED Results / Procedures / Treatments   RADIOLOGY  I have reviewed and interpreted the x-ray images.  Patient does appear to have a fracture around the greater trochanter. Radiology is read subtle cortical irregularity concerning for fracture.  Recommend CT hip. CT hip shows nondisplaced intertrochanteric fracture extending along the proximal  stem.   MEDICATIONS ORDERED IN ED: Medications - No data to display   IMPRESSION / MDM / ASSESSMENT AND PLAN / ED COURSE  I reviewed the triage vital signs and the nursing notes.  Patient's presentation is most consistent with acute presentation with potential threat to life or bodily function.  Patient presents emergency department after a fall with left hip pain.  Patient appears to have a fracture around the left hip prosthesis but appears to be fairly proximal.  CT scan confirms proximal fracture.  I spoke to Dr. Hyacinth Meeker of orthopedics who has reviewed the x-ray and CT images, he believes this is more like a greater trochanteric fracture and states it will be weightbearing as tolerated with pain control but nonoperative management and the patient can follow-up in the office.  Given the fall although she did not hit her head that she is on warfarin and did experience some mild dizziness after the fall per daughter we will obtain a CT scan of the head as a precaution.  We will prescribe short course of pain medication.  Patient took  Tylenol as well as 1 Norco prior to arrival.  Will attempt to ambulate with a walker to see how the patient does.  Patient was able to ambulate with the use of a walker states some pain but not significant.  Patient wishes to go home which I believe is reasonable.  Daughter agreeable to plan as well.  FINAL CLINICAL IMPRESSION(S) / ED DIAGNOSES   Fall Greater trochanteric fracture  Note:  This document was prepared using Dragon voice recognition software and may include unintentional dictation errors.   Minna Antis, MD 11/21/23 [phone]

## 2023-11-21 NOTE — ED Provider Notes (Signed)
-----------------------------------------   8:11 PM on 11/21/2023 -----------------------------------------  Blood pressure (!) 101/45, pulse 76, temperature 97.8 F (36.6 C), temperature source Oral, resp. rate 16, height 5\' 1"  (1.549 m), weight 60.3 kg, SpO2 100%.  Assuming care from Dr. Lenard Lance.  In short, Andrea Griffin is a 89 y.o. female with a chief complaint of Fall .  Refer to the original H&P for additional details.  The current plan of care is to await imaging CT head to return.  As long as imaging is reassuring patient is stable for discharge.  Patient's CT head returns without acute intracranial abnormality.  Patient does have a fracture on imaging of the CT of the hip.  Orthopedics was consulted by previous provider.  It is determined that patient will have weightbearing as tolerated, pain medication at home.  They will follow-up with orthopedics.  Concerning signs and symptoms and return precautions discussed with the patient.  Patient is stable for discharge.   ED diagnosis:  Fall Greater trochanteric fracture.         Lanette Hampshire 11/21/23 2308    Minna Antis, MD 11/22/23 1800

## 2023-11-21 NOTE — Discharge Instructions (Addendum)
 Please take your pain medication as needed but only as prescribed.  Please follow-up with orthopedics as soon as you are able for recheck/reevaluation.  Return to the emergency department for any worsening pain inability to walk or any other symptom personally concerning to yourself.  Do not drink alcohol or drive while taking pain medication.

## 2023-11-21 NOTE — ED Triage Notes (Addendum)
 Pt sts that she was at church and fell and hit her left hip. Pt granddaughter sts that she gave pt two hydrocodone prior to arrival.

## 2023-11-21 NOTE — ED Notes (Signed)
 Patient got up with minimal assistance and ambulated 5-8 steps with front wheel walker. Patient tolerated ambulation well.

## 2024-02-07 ENCOUNTER — Other Ambulatory Visit: Payer: Self-pay

## 2024-02-07 ENCOUNTER — Ambulatory Visit
Admission: RE | Admit: 2024-02-07 | Discharge: 2024-02-07 | Disposition: A | Discharge status: Home / Self Care | Source: Ambulatory Visit

## 2024-02-07 DIAGNOSIS — W19XXXA Unspecified fall, initial encounter: Secondary | ICD-10-CM

## 2024-02-07 DIAGNOSIS — J811 Chronic pulmonary edema: Secondary | ICD-10-CM | POA: Diagnosis not present

## 2024-02-07 DIAGNOSIS — I509 Heart failure, unspecified: Secondary | ICD-10-CM | POA: Diagnosis not present

## 2024-02-07 DIAGNOSIS — R410 Disorientation, unspecified: Secondary | ICD-10-CM

## 2024-02-07 DIAGNOSIS — Y92009 Unspecified place in unspecified non-institutional (private) residence as the place of occurrence of the external cause: Secondary | ICD-10-CM | POA: Insufficient documentation

## 2024-02-07 DIAGNOSIS — M25473 Effusion, unspecified ankle: Secondary | ICD-10-CM | POA: Insufficient documentation

## 2024-02-08 ENCOUNTER — Emergency Department

## 2024-02-08 ENCOUNTER — Observation Stay
Admission: EM | Admit: 2024-02-08 | Discharge: 2024-02-11 | Disposition: A | Discharge status: Home / Self Care | Attending: Internal Medicine | Admitting: Internal Medicine

## 2024-02-08 ENCOUNTER — Other Ambulatory Visit: Payer: Self-pay

## 2024-02-08 DIAGNOSIS — J45909 Unspecified asthma, uncomplicated: Secondary | ICD-10-CM | POA: Diagnosis present

## 2024-02-08 DIAGNOSIS — E871 Hypo-osmolality and hyponatremia: Secondary | ICD-10-CM | POA: Diagnosis not present

## 2024-02-08 DIAGNOSIS — I5023 Acute on chronic systolic (congestive) heart failure: Principal | ICD-10-CM | POA: Diagnosis present

## 2024-02-08 DIAGNOSIS — J4520 Mild intermittent asthma, uncomplicated: Secondary | ICD-10-CM

## 2024-02-08 DIAGNOSIS — E0590 Thyrotoxicosis, unspecified without thyrotoxic crisis or storm: Secondary | ICD-10-CM | POA: Diagnosis not present

## 2024-02-08 DIAGNOSIS — I10 Essential (primary) hypertension: Secondary | ICD-10-CM | POA: Diagnosis present

## 2024-02-08 DIAGNOSIS — W19XXXA Unspecified fall, initial encounter: Secondary | ICD-10-CM

## 2024-02-08 DIAGNOSIS — E785 Hyperlipidemia, unspecified: Secondary | ICD-10-CM | POA: Diagnosis present

## 2024-02-08 DIAGNOSIS — E0580 Other thyrotoxicosis without thyrotoxic crisis or storm: Secondary | ICD-10-CM | POA: Diagnosis not present

## 2024-02-08 DIAGNOSIS — I4819 Other persistent atrial fibrillation: Secondary | ICD-10-CM | POA: Diagnosis present

## 2024-02-08 DIAGNOSIS — I110 Hypertensive heart disease with heart failure: Secondary | ICD-10-CM | POA: Insufficient documentation

## 2024-02-08 DIAGNOSIS — I2510 Atherosclerotic heart disease of native coronary artery without angina pectoris: Secondary | ICD-10-CM | POA: Diagnosis not present

## 2024-02-08 DIAGNOSIS — F32A Depression, unspecified: Secondary | ICD-10-CM | POA: Diagnosis present

## 2024-02-08 DIAGNOSIS — I513 Intracardiac thrombosis, not elsewhere classified: Secondary | ICD-10-CM | POA: Diagnosis present

## 2024-02-08 DIAGNOSIS — R0602 Shortness of breath: Secondary | ICD-10-CM | POA: Diagnosis present

## 2024-02-08 DIAGNOSIS — I509 Heart failure, unspecified: Principal | ICD-10-CM

## 2024-02-08 LAB — URINALYSIS, ROUTINE W REFLEX MICROSCOPIC
Bilirubin Urine: NEGATIVE
Glucose, UA: NEGATIVE mg/dL
Hgb urine dipstick: NEGATIVE
Ketones, ur: NEGATIVE mg/dL
Leukocytes,Ua: NEGATIVE
Nitrite: NEGATIVE
Protein, ur: NEGATIVE mg/dL
Specific Gravity, Urine: 1.005 (ref 1.005–1.030)
pH: 7 (ref 5.0–8.0)

## 2024-02-08 LAB — CBC
HCT: 32.4 % — ABNORMAL LOW (ref 36.0–46.0)
Hemoglobin: 10.9 g/dL — ABNORMAL LOW (ref 12.0–15.0)
MCH: 28.8 pg (ref 26.0–34.0)
MCHC: 33.6 g/dL (ref 30.0–36.0)
MCV: 85.5 fL (ref 80.0–100.0)
Platelets: 267 10*3/uL (ref 150–400)
RBC: 3.79 MIL/uL — ABNORMAL LOW (ref 3.87–5.11)
RDW: 14.9 % (ref 11.5–15.5)
WBC: 7.1 10*3/uL (ref 4.0–10.5)
nRBC: 0 % (ref 0.0–0.2)

## 2024-02-08 LAB — TROPONIN I (HIGH SENSITIVITY)
Troponin I (High Sensitivity): 8 ng/L (ref <18)
Troponin I (High Sensitivity): 7 ng/L (ref <18)

## 2024-02-08 LAB — PROTIME-INR
INR: 2.2 — ABNORMAL HIGH (ref 0.8–1.2)
Prothrombin Time: 24.5 s — ABNORMAL HIGH (ref 11.4–15.2)

## 2024-02-08 LAB — BASIC METABOLIC PANEL WITH GFR
Anion gap: 7 (ref 5–15)
Anion gap: 11 (ref 5–15)
BUN: 16 mg/dL (ref 8–23)
BUN: 15 mg/dL (ref 8–23)
CO2: 23 mmol/L (ref 22–32)
CO2: 24 mmol/L (ref 22–32)
Calcium: 8.6 mg/dL — ABNORMAL LOW (ref 8.9–10.3)
Calcium: 8.9 mg/dL (ref 8.9–10.3)
Chloride: 98 mmol/L (ref 98–111)
Chloride: 94 mmol/L — ABNORMAL LOW (ref 98–111)
Creatinine, Ser: 0.42 mg/dL — ABNORMAL LOW (ref 0.44–1.00)
Creatinine, Ser: 0.46 mg/dL (ref 0.44–1.00)
GFR, Estimated: >60 mL/min (ref >60)
GFR, Estimated: >60 mL/min (ref >60)
Glucose, Bld: 86 mg/dL (ref 70–99)
Glucose, Bld: 99 mg/dL (ref 70–99)
Potassium: 4 mmol/L (ref 3.5–5.1)
Potassium: 3.4 mmol/L — ABNORMAL LOW (ref 3.5–5.1)
Sodium: 128 mmol/L — ABNORMAL LOW (ref 135–145)
Sodium: 129 mmol/L — ABNORMAL LOW (ref 135–145)

## 2024-02-08 LAB — BRAIN NATRIURETIC PEPTIDE: B Natriuretic Peptide: 926.4 pg/mL — ABNORMAL HIGH (ref 0.0–100.0)

## 2024-02-08 MED ORDER — FUROSEMIDE 10 MG/ML IJ SOLN
40.0000 mg | Freq: Two times a day (BID) | INTRAMUSCULAR | Status: DC
  Administered 2024-02-09 – 2024-02-11 (×5): 40 mg via INTRAVENOUS
  Filled 2024-02-08 (×5): qty 4

## 2024-02-08 MED ORDER — ALBUTEROL SULFATE (2.5 MG/3ML) 0.083% IN NEBU: 3.0000 mL | INHALATION_SOLUTION | RESPIRATORY_TRACT | Status: DC | PRN

## 2024-02-08 MED ORDER — METHIMAZOLE 2.5 MG HALF TABLET
2.5000 mg | ORAL_TABLET | Freq: Every day | ORAL | Status: DC
  Administered 2024-02-09 – 2024-02-11 (×3): 2.5 mg via ORAL
  Filled 2024-02-08 (×3): qty 1

## 2024-02-08 MED ORDER — KETOCONAZOLE 2 % EX CREA
1.0000 | TOPICAL_CREAM | Freq: Every day | CUTANEOUS | Status: DC
  Filled 2024-02-08 (×2): qty 15

## 2024-02-08 MED ORDER — SPIRONOLACTONE 25 MG PO TABS: 25.0000 mg | ORAL_TABLET | Freq: Every day | ORAL | Status: DC

## 2024-02-08 MED ORDER — WARFARIN SODIUM 2.5 MG PO TABS
2.5000 mg | ORAL_TABLET | Freq: Once | ORAL | Status: AC
  Administered 2024-02-09: 2.5 mg via ORAL
  Filled 2024-02-08 (×2): qty 1

## 2024-02-08 MED ORDER — WARFARIN - PHARMACIST DOSING INPATIENT
Freq: Every day | Status: DC
  Filled 2024-02-08: qty 1

## 2024-02-08 MED ORDER — POTASSIUM CHLORIDE CRYS ER 20 MEQ PO TBCR
40.0000 meq | EXTENDED_RELEASE_TABLET | Freq: Once | ORAL | Status: AC
  Administered 2024-02-08: 40 meq via ORAL
  Filled 2024-02-08: qty 2

## 2024-02-08 MED ORDER — METOPROLOL SUCCINATE ER 25 MG PO TB24
125.0000 mg | ORAL_TABLET | Freq: Every day | ORAL | Status: DC
  Administered 2024-02-09 – 2024-02-11 (×3): 125 mg via ORAL
  Filled 2024-02-08: qty 1
  Filled 2024-02-08: qty 3
  Filled 2024-02-08: qty 1

## 2024-02-08 MED ORDER — FUROSEMIDE 10 MG/ML IJ SOLN
40.0000 mg | Freq: Once | INTRAMUSCULAR | Status: AC
  Administered 2024-02-08: 40 mg via INTRAVENOUS
  Filled 2024-02-08: qty 4

## 2024-02-08 MED ORDER — SODIUM CHLORIDE 1 G PO TABS
1.0000 g | ORAL_TABLET | Freq: Two times a day (BID) | ORAL | Status: DC
  Administered 2024-02-08 – 2024-02-09 (×2): 1 g via ORAL
  Filled 2024-02-08 (×2): qty 1

## 2024-02-08 MED ORDER — HYDRALAZINE HCL 20 MG/ML IJ SOLN: 5.0000 mg | INTRAMUSCULAR | Status: DC | PRN

## 2024-02-08 MED ORDER — SACUBITRIL-VALSARTAN 24-26 MG PO TABS: 1.0000 | ORAL_TABLET | Freq: Two times a day (BID) | ORAL | Status: DC

## 2024-02-08 MED ORDER — SENNOSIDES-DOCUSATE SODIUM 8.6-50 MG PO TABS: 1.0000 | ORAL_TABLET | Freq: Every day | ORAL | Status: DC | PRN

## 2024-02-08 MED ORDER — ATORVASTATIN CALCIUM 20 MG PO TABS
40.0000 mg | ORAL_TABLET | Freq: Every day | ORAL | Status: DC
  Administered 2024-02-08 – 2024-02-11 (×4): 40 mg via ORAL
  Filled 2024-02-08 (×4): qty 2

## 2024-02-08 MED ORDER — DM-GUAIFENESIN ER 30-600 MG PO TB12: 1.0000 | ORAL_TABLET | Freq: Two times a day (BID) | ORAL | Status: DC | PRN

## 2024-02-08 MED ORDER — PANTOPRAZOLE SODIUM 40 MG PO TBEC: 40.0000 mg | DELAYED_RELEASE_TABLET | Freq: Every day | ORAL | Status: DC

## 2024-02-08 MED ORDER — OYSTER SHELL CALCIUM/D3 500-5 MG-MCG PO TABS
1.0000 | ORAL_TABLET | Freq: Every morning | ORAL | Status: DC
  Administered 2024-02-09 – 2024-02-11 (×3): 1 via ORAL
  Filled 2024-02-08 (×3): qty 1

## 2024-02-08 MED ORDER — ACETAMINOPHEN 325 MG PO TABS
650.0000 mg | ORAL_TABLET | Freq: Four times a day (QID) | ORAL | Status: DC | PRN
  Administered 2024-02-08: 650 mg via ORAL
  Filled 2024-02-08: qty 2

## 2024-02-08 MED ORDER — LORATADINE 10 MG PO TABS
10.0000 mg | ORAL_TABLET | Freq: Every day | ORAL | Status: DC
  Administered 2024-02-09 – 2024-02-11 (×3): 10 mg via ORAL
  Filled 2024-02-08 (×3): qty 1

## 2024-02-08 MED ORDER — ONDANSETRON HCL 4 MG/2ML IJ SOLN: 4.0000 mg | Freq: Three times a day (TID) | INTRAMUSCULAR | Status: DC | PRN

## 2024-02-08 MED ORDER — ESCITALOPRAM OXALATE 10 MG PO TABS
5.0000 mg | ORAL_TABLET | Freq: Every day | ORAL | Status: DC
  Administered 2024-02-09 – 2024-02-11 (×3): 5 mg via ORAL
  Filled 2024-02-08 (×3): qty 1

## 2024-02-08 MED ORDER — FLUTICASONE FUROATE-VILANTEROL 100-25 MCG/ACT IN AEPB
1.0000 | INHALATION_SPRAY | Freq: Every day | RESPIRATORY_TRACT | Status: DC
  Administered 2024-02-09 – 2024-02-11 (×3): 1 via RESPIRATORY_TRACT
  Filled 2024-02-08 (×2): qty 28

## 2024-02-08 NOTE — Consult Note (Signed)
 PHARMACY - ANTICOAGULATION CONSULT NOTE  Pharmacy Consult for warfarin Indication: hx of A fib and thrombus of left atrial appendage  Allergies  Allergen Reactions   Ciprofloxacin Hives   Adhesive [Tape]     Pulls off skin   Nitrofurantoin Other (See Comments)    Can not remember reaction   Phenazopyridine Other (See Comments)    Pt doesn't remember   Ranitidine Other (See Comments)    Can not remember reaction   Sulfa Antibiotics Other (See Comments)    Can not remember reaction   Patient Measurements:  Vital Signs: Temp: 97.4 F (36.3 C) (06/10 2006) Temp Source: Axillary (06/10 2006) BP: 142/55 (06/10 1900) Pulse Rate: 75 (06/10 1900)  Labs: Recent Labs    02/08/24 1410 02/08/24 1627 02/08/24 1828  HGB 10.9*  --   --   HCT 32.4*  --   --   PLT 267  --   --   LABPROT 24.5*  --   --   INR 2.2*  --   --   CREATININE 0.42*  --  0.46  TROPONINIHS 8 7  --    CrCl cannot be calculated (Unknown ideal weight.).  Medical History: Past Medical History:  Diagnosis Date   Anxiety    Arthritis    Asthma    Cancer (HCC)    melanoma skin cancer on head   Diastolic dysfunction    a. 05/2023 Echo: EF 70-76^, no rwma, GrII DD, nl RV fxn, mildly dil RA, mod dil LA. Mild MR. Mod AI.   GERD (gastroesophageal reflux disease)    History of recurrent cystitis    Hyperlipidemia    Hypertension    Infection of prosthetic hip joint (HCC)    Mitral regurgitation    Moderate aortic insufficiency    Nocturia    Obesity    Osteopenia    Persistent atrial fibrillation (HCC)    Thyroid disease    Urinary frequency    Urinary incontinence    Urinary urgency    Wears glasses    Medications:  warfarin (COUMADIN ) 2.5 MG tablet Take one tablet, 2.5mg , on Wednesdays and take (2) tablets, 5mg , all other days  Per previous [facility] record 01/12/2024; last fill was 160 tablets for an 86 day supply  Confirmed by the granddaughter  Last does taken the morning of 6/10   Assessment: Andrea Griffin is  a 89 yo female who presented to the ED due to ongoing difficulty breathing. They have a past medical history significant for atrial fibrillation on warfarin and CHF. Over the last couple of days they've experienced weight gain (10 lbs in last 2 weeks) and shortness of breath. They were started on lasix  daily after it was held due to the development of hyponatremia. They were admitted to the hospital due to weakness in the setting of a CHF exacerbation. Pharmacy has been consulted to manage this patient's warfarin.   Baseline labs Hgb 10.9 Plt 267  Goal of Therapy:  INR range 2.0 - 3.0 (desired range 2.0 - 2.5 INR goal per granddaughter; previously 3/25 the desired target was 2.5 - 3.0 due to thrombus)  Monitor platelets by anticoagulation protocol: Yes  INR 6/10@1410   INR = 2.2 Therapeutic  5 mg    Plan:  -- Per granddaughter patient received their warfarin dose this morning 6/10 -- Plan to order their usual Wednesday dose of 2.5 mg assuming INR remains therapeutic  -- Continue to follow CBC while on warfarin  Thank you for allowing pharmacy  to participate in this patient's care.   Craven Do, PharmD Pharmacy Resident  02/08/2024 8:46 PM

## 2024-02-08 NOTE — ED Triage Notes (Addendum)
 Pt here with abnormal labs, low sodium 127. Pt states she has not felt good for the past couple of days. Pt endorses SOB at times. Pt fell 3 days ago and hit her head. Pt was seen and had a scan of her head. Pt has hx of a fib.

## 2024-02-08 NOTE — ED Notes (Signed)
 Report given to [facility].

## 2024-02-08 NOTE — H&P (Signed)
 History and Physical    Andrea Griffin:[id number] DOB: 1932-08-05 DOA: 02/08/2024  Referring MD/NP/PA:   PCP: Germain Kohler, MD   Patient coming from:  The patient is coming from home.     Chief Complaint: SOB, weakness and fall  HPI: Andrea Griffin is a 89 y.o. female with medical history significant of HTN, HLD, hyperthyroidism, nonobstructive CAD on cath November 2024, paroxysmal A-fib and left atrial appendage thrombus on Coumadin , cardiomyopathy (EF 40 to 45% on TEE 09/2023), depression, anemia, chronic hyponatremia, who presents with SOB, weakness and fall.  Patient was recently hospitalized from 3/2 - 3/7 due to CHF exacerbation and slurred speech.  Patient had MRI of brain which was negative for stroke.  Per patient and her granddaughter at the bedside, patient has weakness in the past several days.  She fell 3 days ago. She was seen in ED and had CT scan of head which was negative for acute injury yesterday.  Patient has mild SOB and and about 10 pound weight gain recently.  She has trace leg edema.  Patient was seen by PCP who checked sodium level which was 127.  Patient denies chest pain, fever, chills.  No nausea, vomiting, diarrhea or abdominal pain.  No symptoms of UTI.  Denies rectal bleeding or dark stool. When I saw pt in ED, she is alert, oriented x 3.    Data reviewed independently and ED Course: pt was found to have BNP 922.4, WBC 7.1, GFR > 60, sodium 128, troponin 8 --> 7, INR 2.2.  Temperature normal, blood pressure 146/71, heart rate 74, RR 20, oxygen saturation 97% on room air.  Chest x-ray showed cardiomegaly and vascular congestion.  Patient is placed in telemetry bed for patient.   EKG: I have personally reviewed.  A-fib, QTc 473, heart rate 72, LAD, poor IV progression, ST depression in lead I/aVL.   Review of Systems:   General: no fevers, chills, no body weight gain, has fatigue HEENT: no blurry vision, hearing changes or sore throat Respiratory: has  dyspnea, no coughing, wheezing CV: no chest pain, no palpitations GI: no nausea, vomiting, abdominal pain, diarrhea, constipation GU: no dysuria, burning on urination, increased urinary frequency, hematuria  Ext: has mild leg edema Neuro: no unilateral weakness, numbness, or tingling, no vision change or hearing loss. Had fall. Skin: no rash, no skin tear. MSK: No muscle spasm, no deformity, no limitation of range of movement in spin Heme: No easy bruising.  Travel history: No recent long distant travel.   Allergy:  Allergies  Allergen Reactions   Ciprofloxacin Hives   Adhesive [Tape]     Pulls off skin   Nitrofurantoin Other (See Comments)    Can not remember reaction   Phenazopyridine Other (See Comments)    Pt doesn't remember   Ranitidine Other (See Comments)    Can not remember reaction   Sulfa Antibiotics Other (See Comments)    Can not remember reaction    Past Medical History:  Diagnosis Date   Anxiety    Arthritis    Asthma    Cancer (HCC)    melanoma skin cancer on head   Diastolic dysfunction    a. 05/2023 Echo: EF 70-76^, no rwma, GrII DD, nl RV fxn, mildly dil RA, mod dil LA. Mild MR. Mod AI.   GERD (gastroesophageal reflux disease)    History of recurrent cystitis    Hyperlipidemia    Hypertension    Infection of prosthetic hip joint (HCC)  Mitral regurgitation    Moderate aortic insufficiency    Nocturia    Obesity    Osteopenia    Persistent atrial fibrillation (HCC)    Thyroid disease    Urinary frequency    Urinary incontinence    Urinary urgency    Wears glasses     Past Surgical History:  Procedure Laterality Date   ABDOMINAL HYSTERECTOMY     BACK SURGERY  01/21/2016   lumbar fusion   bladder stem dilation     BREAST BIOPSY Right    x2   BREAST BIOPSY Right    x2   CARDIOVERSION N/A 08/09/2023   Procedure: CARDIOVERSION;  Surgeon: Janette Medley, MD;  Location: [facility] ORS;  Service: Cardiovascular;  Laterality: N/A;    CARDIOVERSION N/A 09/20/2023   Procedure: CARDIOVERSION;  Surgeon: Janette Medley, MD;  Location: [facility] OR[facility];  Service: Cardiovascular;  Laterality: N/A;   CHOLECYSTECTOMY     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 Mozell Arias   INCISION AND DRAINAGE HIP Right 09/15/2012   INCISION AND DRAINAGE HIP Right 2013   Dr. Mozell Arias   LEFT HEART CATH AND CORONARY ANGIOGRAPHY N/A 07/16/2023   Procedure: LEFT HEART CATH AND CORONARY ANGIOGRAPHY;  Surgeon: Wenona Hamilton, MD;  Location: [facility];  Service: Cardiovascular;  Laterality: N/A;   LUMBAR FUSION  01/21/2016   L4-L5 by Dr. Ellery Guthrie   MELANOMA EXCISION     skin cancer on top of head   OPEN REDUCTION INTERNAL FIXATION (ORIF) DISTAL RADIAL FRACTURE Right 10/05/2019   Procedure: OPEN REDUCTION INTERNAL FIXATION (ORIF) DISTAL RADIAL FRACTURE;  Surgeon: Molli Angelucci, MD;  Location: [facility] OR[facility];  Service: Orthopedics;  Laterality: Right;   TEE WITHOUT CARDIOVERSION N/A 08/09/2023   Procedure: TRANSESOPHAGEAL ECHOCARDIOGRAM (TEE);  Surgeon: Alluri, Odessa Bene, MD;  Location: [facility] OR[facility];  Service: Cardiovascular;  Laterality: N/A;   TEE WITHOUT CARDIOVERSION N/A 09/20/2023   Procedure: TRANSESOPHAGEAL ECHOCARDIOGRAM (TEE);  Surgeon: Alluri, Odessa Bene, MD;  Location: [facility] OR[facility];  Service: Cardiovascular;  Laterality: N/A;    Social History:  reports that she has never smoked. She has never used smokeless tobacco. She reports that she does not drink alcohol and does not use drugs.  Family History:  Family History  Problem Relation Age of Onset   Stroke Mother      Prior to Admission medications   Medication Sig Start Date End Date Taking? Authorizing Provider  acetaminophen  (TYLENOL ) 325 MG tablet Take 2 tablets (650 mg total) by mouth every 6 (six) hours as needed for mild pain (pain score 1-3), fever or headache (or Fever >/= 101). 11/05/23    Alexander, Natalie, DO  atorvastatin  (LIPITOR ) 40 MG tablet Take 1 tablet (40 mg total) by mouth daily. 07/18/23   Alexander, Natalie, DO  Calcium  Carb-Cholecalciferol  (CALCIUM  600+D) 600-800 MG-UNIT TABS Take 2 tablets by mouth in the morning.    [provider]  escitalopram  (LEXAPRO ) 5 MG tablet Take 5 mg by mouth daily.    [provider]  fexofenadine (ALLEGRA) 180 MG tablet Take 180 mg by mouth daily.    [provider]  fluticasone  (FLONASE ) 50 MCG/ACT nasal spray Place 2 sprays into both nostrils daily.    [provider]  Fluticasone -Salmeterol (ADVAIR) 100-50 MCG/DOSE AEPB Inhale 1 puff into the lungs every 12 (twelve) hours.    [provider]  furosemide  (LASIX ) 40 MG tablet  Take 1 tablet (40 mg total) by mouth daily. Increase to 1 tablet (40 mg total) by mouth TWICE daily (total daily dose 80 mg) as needed for up to 2 days for increased leg swelling, shortness of breath, weight gain 5+ lbs over 1-2 days. Seek medical care if these symptoms are not improving with increased dose. 11/06/23   Alexander, Natalie, DO  guaiFENesin  (MUCINEX ) 600 MG 12 hr tablet Take 1 tablet (600 mg total) by mouth 2 (two) times daily. 11/05/23   Alexander, Natalie, DO  HYDROcodone -acetaminophen  (NORCO/VICODIN) 5-325 MG tablet Take 1 tablet by mouth every 4 (four) hours as needed. 11/21/23   Ruth Cove, MD  Lidocaine  (BLUE-EMU PAIN RELIEF DRY EX) Apply 1 application  topically 2 (two) times daily as needed (Back pain).    [provider]  methimazole  (TAPAZOLE ) 5 MG tablet Take 2.5 mg by mouth daily. 03/25/23   [provider]  metoprolol  succinate (TOPROL -XL) 50 MG 24 hr tablet Take 2.5 tablets (125 mg total) by mouth daily. Take with or immediately following a meal. 11/06/23 12/06/23  Alexander, Natalie, DO  omeprazole (PRILOSEC) 20 MG capsule Take 20 mg by mouth 2 (two) times daily.     [provider]  sacubitril -valsartan  (ENTRESTO ) 24-26  MG Take 1 tablet by mouth 2 (two) times daily. 11/05/23   Alexander, Natalie, DO  senna-docusate (SENOKOT-S) 8.6-50 MG tablet Take 1 tablet by mouth daily as needed for mild constipation.    [provider]  spironolactone  (ALDACTONE ) 25 MG tablet Take 1 tablet (25 mg total) by mouth daily. 11/06/23   Alexander, Natalie, DO  warfarin (COUMADIN ) 2.5 MG tablet 2.5 mg (one tablet) on Tue/Thurs/Sat, and 5 mg (two tablets) on Mon, Wed, Fri, and Sun (Total weekly dose: 27.5) 11/05/23   Melodi Sprung, DO    Physical Exam: Vitals:   02/08/24 1337 02/08/24 1700 02/08/24 1730 02/08/24 1830  BP:  (!) 136/52 (!) 146/71 (!) 123/97  Pulse:  72 68 66  Resp:  16 20 (!) 21  Temp:      TempSrc:      SpO2: 97% 95% 96% 96%   General: Not in acute distress HEENT:       Eyes: PERRL, EOMI, no jaundice       ENT: No discharge from the ears and nose, no pharynx injection, no tonsillar enlargement.        Neck: positive JVD, no bruit, no mass felt. Heme: No neck lymph node enlargement. Cardiac: S1/S2, irregularly irregular rhythm, no gallops or rubs. Respiratory: No rales, wheezing, rhonchi or rubs. GI: Soft, nondistended, nontender, no rebound pain, no organomegaly, BS present. GU: No hematuria Ext: Has trace leg edema bilaterally. 1+DP/PT pulse bilaterally. Musculoskeletal: No joint deformities, No joint redness or warmth, no limitation of ROM in spin. Skin: No rashes.  Neuro: Alert, oriented X3, cranial nerves II-XII grossly intact, moves all extremities normally.  Psych: Patient is not psychotic, no suicidal or hemocidal ideation.  Labs on Admission: I have personally reviewed following labs and imaging studies  CBC: Recent Labs  Lab 02/08/24 1410  WBC 7.1  HGB 10.9*  HCT 32.4*  MCV 85.5  PLT 267   Basic Metabolic Panel: Recent Labs  Lab 02/08/24 1410 02/08/24 1828  NA 128* 129*  K 4.0 3.4*  CL 98 94*  CO2 23 24  GLUCOSE 86 99  BUN 16 15  CREATININE 0.42* 0.46  CALCIUM  8.6*  8.9   GFR: CrCl cannot be calculated (Unknown ideal weight.). Liver Function  Tests: No results for input(s): "AST", "ALT", "ALKPHOS", "BILITOT", "PROT", "ALBUMIN " in the last 168 hours. No results for input(s): "LIPASE", "AMYLASE" in the last 168 hours. No results for input(s): "AMMONIA" in the last 168 hours. Coagulation Profile: Recent Labs  Lab 02/08/24 1410  INR 2.2*   Cardiac Enzymes: No results for input(s): "CKTOTAL", "CKMB", "CKMBINDEX", "TROPONINI" in the last 168 hours. BNP (last 3 results) No results for input(s): "PROBNP" in the last 8760 hours. HbA1C: No results for input(s): "HGBA1C" in the last 72 hours. CBG: No results for input(s): "GLUCAP" in the last 168 hours. Lipid Profile: No results for input(s): "CHOL", "HDL", "LDLCALC", "TRIG", "CHOLHDL", "LDLDIRECT" in the last 72 hours. Thyroid Function Tests: No results for input(s): "TSH", "T4TOTAL", "FREET4", "T3FREE", "THYROIDAB" in the last 72 hours. Anemia Panel: No results for input(s): "VITAMINB12", "FOLATE", "FERRITIN", "TIBC", "IRON", "RETICCTPCT" in the last 72 hours. Urine analysis:    Component Value Date/Time   COLORURINE COLORLESS (A) 02/08/2024 1740   APPEARANCEUR CLEAR (A) 02/08/2024 1740   APPEARANCEUR Clear 03/13/2014 1403   LABSPEC 1.005 02/08/2024 1740   LABSPEC 1.030 03/13/2014 1403   PHURINE 7.0 02/08/2024 1740   GLUCOSEU NEGATIVE 02/08/2024 1740   GLUCOSEU Negative 03/13/2014 1403   HGBUR NEGATIVE 02/08/2024 1740   BILIRUBINUR NEGATIVE 02/08/2024 1740   BILIRUBINUR Negative 03/13/2014 1403   KETONESUR NEGATIVE 02/08/2024 1740   PROTEINUR NEGATIVE 02/08/2024 1740   NITRITE NEGATIVE 02/08/2024 1740   LEUKOCYTESUR NEGATIVE 02/08/2024 1740   LEUKOCYTESUR Negative 03/13/2014 1403   Sepsis Labs: @LABRCNTIP (procalcitonin:4,lacticidven:4) )No results found for this or any previous visit (from the past 240 hours).   Radiological Exams on Admission:   Assessment/Plan Principal Problem:    Acute on chronic systolic CHF (congestive heart failure) (HCC) Active Problems:   Hyponatremia   Persistent atrial fibrillation (HCC)   Thrombus of left atrial appendage   Nonobstructive CAD on [facility] 07/2023   Essential hypertension   Hyperthyroidism   HLD (hyperlipidemia)   Asthma   Fall at home, initial encounter   Depression   Assessment and Plan:   Acute on chronic systolic CHF (congestive heart failure) [facility]): Patient has SOB, 10 pound weight gain, elevated BNP 926, vascular testing on chest x-ray, clinically consistent with CHF exacerbation.  2D echo 09/20/2023 showed EF of 40-45%.  P   -Will admit to tele bed   as inpatient -Place in tele bed   for observation -Lasix  40 mg bid by IV -Continue home spironolactone  -Daily weights -strict I/O's -Low salt diet -Fluid restriction -As needed bronchodilators for shortness of breath  Hyponatremia: Patient has hyponatremia with sodium 128 which improved to 129 after received 40 mg of Lasix  in ED. - Fluid restriction - Sodium chloride  tablet 1 g twice daily - Hold the Entresto   Persistent atrial fibrillation (HCC): CHADS2 score is 6. HR 70s -Coumadin  per Pharm - Metoprolol   Thrombus of left atrial appendage -Coumadin   Nonobstructive CAD on [facility] 07/2023: No chest pain.  Troponin 8 --> 7 -Lipitor   Essential hypertension -Patient is on Lasix  10 spironolactone  - Metoprolol  - IV hydralazine  as needed  Hyperthyroidism -Continue methimazole   HLD (hyperlipidemia) -Lipitor   Asthma: Stable -Bronchodilators and as needed Mucinex   Fall at home, initial encounter -For precaution - PT/OT  Depression - Continue Lexapro       DVT ppx: on Coumding  Code Status: DNR per pt and her granddaughter  Family Communication:     Yes, patient's husband and granddaughter   at bed side.     Disposition Plan:  Anticipate discharge back to previous environment  Consults called: None  Admission status and Level of care:  Telemetry Cardiac:    for obs        Dispo: The patient is from: Home              Anticipated d/c is to: Home              Anticipated d/c date is: 1 day              Patient currently is not medically stable to d/c.    Severity of Illness:  The appropriate patient status for this patient is OBSERVATION. Observation status is judged to be reasonable and necessary in order to provide the required intensity of service to ensure the patient's safety. The patient's presenting symptoms, physical exam findings, and initial radiographic and laboratory data in the context of their medical condition is felt to place them at decreased risk for further clinical deterioration. Furthermore, it is anticipated that the patient will be medically stable for discharge from the hospital within 2 midnights of admission.          Date of Service 02/08/2024    Fidencio Hue [facility]   If 7PM-7AM, please contact night-coverage [email or web address] 02/08/2024, 7:25 PM

## 2024-02-08 NOTE — ED Notes (Signed)
 [facility] notified for cardiac monitoring

## 2024-02-08 NOTE — ED Provider Notes (Signed)
 [facility] Provider Note    Event Date/Time   First MD Initiated Contact with Patient 02/08/24 1608     (approximate)   History   Chief Complaint Abnormal Labs   HPI  Andrea Griffin is a 89 y.o. female with past medical history of hypertension, hyperlipidemia, CAD, atrial fibrillation on Coumadin , and CHF who presents to the ED for abnormal labs.  Granddaughter at bedside is patient's caregiver, reports that she has been increasingly confused and short of breath over the past 5 days.  Patient's Lasix  had to be stopped last month due to hyponatremia, was restarted a couple of days ago as she began to gain weight and become short of breath.  She had her sodium level rechecked by her PCP today, when it was once again found to be trending downwards.  Due to ongoing difficulty breathing, patient was referred to the ED for further evaluation.  She denies any pain in her chest, granddaughter does state that she has gained about 10 pounds in the past 2 weeks.  They have noticed some swelling in her legs but she denies any pain in either leg.     Physical Exam   Triage Vital Signs: ED Triage Vitals  Encounter Vitals Group     BP 02/08/24 1334 (!) 142/68     Systolic BP Percentile --      Diastolic BP Percentile --      Pulse Rate 02/08/24 1334 74     Resp 02/08/24 1334 17     Temp 02/08/24 1334 98.3 F (36.8 C)     Temp Source 02/08/24 1334 Oral     SpO2 02/08/24 1337 97 %     Weight --      Height --      Head Circumference --      Peak Flow --      Pain Score 02/08/24 1334 6     Pain Loc --      Pain Education --      Exclude from Growth Chart --     Most recent vital signs: Vitals:   02/08/24 1334 02/08/24 1337  BP: (!) 142/68   Pulse: 74   Resp: 17   Temp: 98.3 F (36.8 C)   SpO2:  97%    Constitutional: Alert and oriented. Eyes: Conjunctivae are normal. Head: Atraumatic. Nose: No congestion/rhinnorhea. Mouth/Throat: Mucous membranes  are moist.  Cardiovascular: Normal rate, irregularly irregular rhythm. Grossly normal heart sounds.  2+ radial pulses bilaterally. Respiratory: Normal respiratory effort.  No retractions. Lungs with crackles to bilateral bases. Gastrointestinal: Soft and nontender. No distention. Musculoskeletal: No lower extremity tenderness, 1+ pitting edema to knees bilaterally. Neurologic:  Normal speech and language. No gross focal neurologic deficits are appreciated.    ED Results / Procedures / Treatments   Labs (all labs ordered are listed, but only abnormal results are displayed) Labs Reviewed  BASIC METABOLIC PANEL WITH GFR - Abnormal; Notable for the following components:      Result Value   Sodium 128 (*)    Creatinine, Ser 0.42 (*)    Calcium  8.6 (*)    All other components within normal limits  CBC - Abnormal; Notable for the following components:   RBC 3.79 (*)    Hemoglobin 10.9 (*)    HCT 32.4 (*)    All other components within normal limits  PROTIME-INR  BRAIN NATRIURETIC PEPTIDE  URINALYSIS, ROUTINE W REFLEX MICROSCOPIC  TROPONIN I (HIGH SENSITIVITY)  TROPONIN  I (HIGH SENSITIVITY)     EKG  ED ECG REPORT I, Twilla Galea, the attending physician, personally viewed and interpreted this ECG.   Date: 02/08/2024  EKG Time: 13:53  Rate: 72  Rhythm: atrial fibrillation  Axis: LAD  Intervals:left anterior fascicular block  ST&T Change: None  RADIOLOGY Chest x-ray reviewed and interpreted by me with cardiomegaly and pulmonary edema, no focal infiltrate noted.  PROCEDURES:  Critical Care performed: No  Procedures   MEDICATIONS ORDERED IN ED: Medications  furosemide  (LASIX ) injection 40 mg (40 mg Intravenous Given 02/08/24 1641)     IMPRESSION / MDM / ASSESSMENT AND PLAN / ED COURSE  I reviewed the triage vital signs and the nursing notes.                              89 y.o. female with past medical history of hypertension, hyperlipidemia, CAD, CHF, and  atrial fibrillation on Coumadin  who presents to the ED for increased confusion with difficulty breathing over the past 5 days.  Patient's presentation is most consistent with acute presentation with potential threat to life or bodily function.  Differential diagnosis includes, but is not limited to, hyponatremia, other electrolyte abnormality, AKI, CHF exacerbation, arrhythmia, ACS, anemia, UTI.  Patient nontoxic-appearing and in no acute distress, vital signs are unremarkable.  EKG shows atrial fibrillation with controlled rate, no ischemic changes noted.  Patient does appear clinically fluid overloaded and chest x-ray concerning for pulmonary edema.  Labs show hyponatremia similar to recent results from PCPs office, downtrending from last week results.  Care everywhere shows that patient had significant hyponatremia to 124 when previously taking Lasix , however with significant CHF exacerbation she would benefit from IV Lasix  at this time.  She did have a fall yesterday, outpatient CT head was negative for acute process.  Troponin within normal limits and no evidence of ACS.  Given weakness, fall, and confusion in the setting of CHF exacerbation, case discussed with hospitalist for admission.      FINAL CLINICAL IMPRESSION(S) / ED DIAGNOSES   Final diagnoses:  Acute on chronic congestive heart failure, unspecified heart failure type (HCC)  Hyponatremia     Rx / DC Orders   ED Discharge Orders     None        Note:  This document was prepared using Dragon voice recognition software and may include unintentional dictation errors.   Twilla Galea, MD 02/08/24 [phone]

## 2024-02-09 DIAGNOSIS — I5023 Acute on chronic systolic (congestive) heart failure: Secondary | ICD-10-CM | POA: Diagnosis not present

## 2024-02-09 LAB — CBC
HCT: 33.8 % — ABNORMAL LOW (ref 36.0–46.0)
Hemoglobin: 11.3 g/dL — ABNORMAL LOW (ref 12.0–15.0)
MCH: 28.5 pg (ref 26.0–34.0)
MCHC: 33.4 g/dL (ref 30.0–36.0)
MCV: 85.1 fL (ref 80.0–100.0)
Platelets: 300 10*3/uL (ref 150–400)
RBC: 3.97 MIL/uL (ref 3.87–5.11)
RDW: 15 % (ref 11.5–15.5)
WBC: 8.6 10*3/uL (ref 4.0–10.5)
nRBC: 0 % (ref 0.0–0.2)

## 2024-02-09 LAB — BASIC METABOLIC PANEL WITH GFR
Anion gap: 10 (ref 5–15)
BUN: 13 mg/dL (ref 8–23)
CO2: 22 mmol/L (ref 22–32)
Calcium: 8.7 mg/dL — ABNORMAL LOW (ref 8.9–10.3)
Chloride: 96 mmol/L — ABNORMAL LOW (ref 98–111)
Creatinine, Ser: 0.39 mg/dL — ABNORMAL LOW (ref 0.44–1.00)
GFR, Estimated: >60 mL/min (ref >60)
Glucose, Bld: 81 mg/dL (ref 70–99)
Potassium: 3.7 mmol/L (ref 3.5–5.1)
Sodium: 128 mmol/L — ABNORMAL LOW (ref 135–145)

## 2024-02-09 LAB — MAGNESIUM: Magnesium: 1.7 mg/dL (ref 1.7–2.4)

## 2024-02-09 LAB — PROTIME-INR
INR: 1.8 — ABNORMAL HIGH (ref 0.8–1.2)
Prothrombin Time: 21.5 s — ABNORMAL HIGH (ref 11.4–15.2)

## 2024-02-09 MED ORDER — ACETAMINOPHEN 500 MG PO TABS
1000.0000 mg | ORAL_TABLET | Freq: Two times a day (BID) | ORAL | Status: DC
  Administered 2024-02-09 – 2024-02-11 (×5): 1000 mg via ORAL
  Filled 2024-02-09 (×5): qty 2

## 2024-02-09 MED ORDER — PANTOPRAZOLE SODIUM 40 MG PO TBEC
40.0000 mg | DELAYED_RELEASE_TABLET | Freq: Two times a day (BID) | ORAL | Status: DC
  Administered 2024-02-09 – 2024-02-11 (×5): 40 mg via ORAL
  Filled 2024-02-09 (×5): qty 1

## 2024-02-09 NOTE — Evaluation (Signed)
 Occupational Therapy Evaluation Patient Details Name: Andrea Griffin MRN: [id number] DOB: 1932-05-25 Today's Date: 02/09/2024   History of Present Illness   89 y.o. female with medical history significant of HTN, HLD, hyperthyroidism, nonobstructive CAD on cath November 2024, paroxysmal A-fib and left atrial appendage thrombus on Coumadin , cardiomyopathy (EF 40 to 45% on TEE 09/2023), depression, anemia, chronic hyponatremia, who presents with SOB, weakness and fall.     Clinical Impressions Pt was seen for OT evaluation this date. PTA, pt lives at home with her spouse (has Alzheimer's) and her granddaughter has now moved in full time. Granddaughter does work Warden/ranger and she reports they are looking for a sitter to come perform IADLs during that time. Pt is IND with ADL performance and MOD I for household distances with RW use. Has supervision assist for sponge bathing. Granddaughter manages all IADLs. Pt presents to acute OT demonstrating impaired ADL performance and functional mobility 2/2 mild weakness and confusion. Pt currently requires MOD I for bed mobility, supervision for STS and in room mobility using RW. All aspects of toileting performed with CGA/SBA. LB dressing to doff/don new pull up and standing oral care with CGA/SBA. Pt appears close to her baseline physically, but mild confusion noted requiring increased cueing for initiation and staying on task. Will follow acutely to ensure safe DC home. Will recommend HHOT services on DC.       If plan is discharge home, recommend the following:   A little help with walking and/or transfers;A little help with bathing/dressing/bathroom     Functional Status Assessment   Patient has had a recent decline in their functional status and demonstrates the ability to make significant improvements in function in a reasonable and predictable amount of time.     Equipment Recommendations   None recommended by OT     Recommendations  for Other Services         Precautions/Restrictions   Precautions Precautions: Fall Recall of Precautions/Restrictions: Impaired Restrictions Weight Bearing Restrictions Per Provider Order: No     Mobility Bed Mobility Overal bed mobility: Modified Independent             General bed mobility comments: increased time/effort with use of bedrails (she has at home)    Transfers Overall transfer level: Needs assistance Equipment used: Rolling walker (2 wheels) Transfers: Sit to/from Stand Sit to Stand: Supervision           General transfer comment: supervision for STS from EOB to RW and CGA for in room mobility to the sink and BSC      Balance Overall balance assessment: No apparent balance deficits (not formally assessed)                                         ADL either performed or assessed with clinical judgement   ADL Overall ADL's : Needs assistance/impaired     Grooming: Wash/dry hands;Supervision/safety               Lower Body Dressing: Contact guard assist;Sitting/lateral leans;Sit to/from stand   Toilet Transfer: Contact guard assist;BSC/3in1;Rolling walker (2 wheels)   Toileting- Clothing Manipulation and Hygiene: Supervision/safety;Sitting/lateral Geophysical data processor  Pertinent Vitals/Pain Pain Assessment Pain Assessment: 0-10 Pain Score: 5  Pain Location: chronic back pain Pain Descriptors / Indicators: Aching, Sore Pain Intervention(s): Monitored during session, Repositioned     Extremity/Trunk Assessment Upper Extremity Assessment Upper Extremity Assessment: Overall WFL for tasks assessed   Lower Extremity Assessment Lower Extremity Assessment: Generalized weakness       Communication Communication Communication: No apparent difficulties   Cognition Arousal: Alert Behavior During Therapy: WFL for tasks assessed/performed Cognition: No  apparent impairments             OT - Cognition Comments: pt mildly confused/needed increased time and cueing but granddaughter thinks it is due to being in the hospital                 Following commands: Intact       Cueing  General Comments   Cueing Techniques: Verbal cues  Pt pleasant and eager to work with PT, ultimately able to do prolonged walk and did not need physical assist with mobility   Exercises Other Exercises Other Exercises: Edu on role of OT in acute setting.   Shoulder Instructions      Home Living Family/patient expects to be discharged to:: Private residence Living Arrangements: Spouse/significant other Available Help at Discharge: Family;Available 24 hours/day Type of Home: House Home Access: Stairs to enter [employer] of Steps: 3 Entrance Stairs-Rails: Left Home Layout: One level     Bathroom Shower/Tub: Tub/shower unit         Home Equipment: Agricultural consultant (2 wheels);Rollator (4 wheels);Cane - single point;BSC/3in1;Grab bars - toilet;Shower seat;Cane - quad;Hand held shower head   Additional Comments: Tues/Thurs from 9am-4pm granddaughter has to work in her office, but otherwise she is there 24/7; also now has bed rails      Prior Functioning/Environment Prior Level of Function : Independent/Modified Independent             Mobility Comments: RW household distances only now; 1 recent fall less than a week ago and another in [employer] ADLs Comments: granddaughter manages IADLs, provides supervision for bathing via sponge/sink bath; pt scared of shower;    OT Problem List: Decreased strength;Decreased cognition   OT Treatment/Interventions: Self-care/ADL training;Therapeutic exercise;Therapeutic activities;Patient/family education;Balance training      OT Goals(Current goals can be found in the care plan section)   Acute Rehab OT Goals Patient Stated Goal: return home OT Goal Formulation: With  patient/family Time For Goal Achievement: 02/23/24 Potential to Achieve Goals: Good ADL Goals Pt Will Perform Lower Body Bathing: with modified independence;with supervision;sitting/lateral leans;sit to/from stand Pt Will Perform Lower Body Dressing: with modified independence;with supervision;sitting/lateral leans;sit to/from stand Pt Will Transfer to Toilet: with modified independence;ambulating;regular height toilet   OT Frequency:  Min 2X/week    Co-evaluation              AM-PAC OT 6 Clicks Daily Activity     Outcome Measure Help from another person eating meals?: None Help from another person taking care of personal grooming?: None Help from another person toileting, which includes using toliet, bedpan, or urinal?: A Little Help from another person bathing (including washing, rinsing, drying)?: A Little Help from another person to put on and taking off regular upper body clothing?: None Help from another person to put on and taking off regular lower body clothing?: A Little 6 Click Score: 21   End of Session Equipment Utilized During Treatment: Rolling walker (2 wheels) Nurse Communication: Mobility status  Activity Tolerance: Patient tolerated treatment  well Patient left: with call bell/phone within reach;in bed;with bed alarm set;with family/visitor present  OT Visit Diagnosis: Other abnormalities of gait and mobility (R26.89);Muscle weakness (generalized) (M62.81)                Time: 8119-1478 OT Time Calculation (min): 42 min Charges:  OT General Charges $OT Visit: 1 Visit OT Evaluation $OT Eval Moderate Complexity: 1 Mod OT Treatments $Self Care/Home Management : 23-37 mins  Demiah Gullickson, OTR/L 02/09/24, 1:06 PM  Symiah Nowotny E Shontez Sermon 02/09/2024, 1:01 PM

## 2024-02-09 NOTE — Plan of Care (Signed)

## 2024-02-09 NOTE — Care Management Obs Status (Signed)
 MEDICARE OBSERVATION STATUS NOTIFICATION   Patient Details  Name: LOUETTA HOLLINGSHEAD MRN: [id number] Date of Birth: 1932-04-18   Medicare Observation Status Notification Given:  No (patient did not want a copy)    Anise Kerns 02/09/2024, 4:41 PM

## 2024-02-09 NOTE — Evaluation (Signed)
 Physical Therapy Evaluation Patient Details Name: Andrea Griffin MRN: [id number] DOB: 1932-02-14 Today's Date: 02/09/2024  History of Present Illness  89 y.o. female with medical history significant of HTN, HLD, hyperthyroidism, nonobstructive CAD on cath November 2024, paroxysmal A-fib and left atrial appendage thrombus on Coumadin , cardiomyopathy (EF 40 to 45% on TEE 09/2023), depression, anemia, chronic hyponatremia, who presents with SOB, weakness and fall.  Clinical Impression  Pt showed good effort and ability to do a more prolonged bout of ambulation (>100 ft with walker).  She did endorse some fatigue with SpO2 staying >92% on room air but HR breaching 100 bpm by the end.  Pt with baseline L hip Trendelenberg sign but she reports this as baseline and not a cause of her recent fall.  Pt did relatively well, will benefit from continued PT to address functional limitations.        If plan is discharge home, recommend the following: Assistance with cooking/housework;Assist for transportation;Help with stairs or ramp for entrance   Can travel by private vehicle        Equipment Recommendations None recommended by PT  Recommendations for Other Services       Functional Status Assessment Patient has had a recent decline in their functional status and demonstrates the ability to make significant improvements in function in a reasonable and predictable amount of time.     Precautions / Restrictions Precautions Precautions: Fall Restrictions Weight Bearing Restrictions Per Provider Order: No      Mobility  Bed Mobility Overal bed mobility: Modified Independent             General bed mobility comments: Pt able to get up to sitting EOB with relative ease and no need for direct assist    Transfers Overall transfer level: Modified independent Equipment used: Rolling walker (2 wheels)               General transfer comment: with appropriate use of UEs able to rise to  standing with only cuing for set up and sequencing    Ambulation/Gait Ambulation/Gait assistance: Supervision Gait Distance (Feet): 125 Feet Assistive device: Rolling walker (2 wheels)         General Gait Details: Pt with L Trendelenberg sign, she reports this as chronic limp post hip replacement.  Pt with some reported fatigue with the effort, HR did briefly rise to >100, O2 stayed >92% on room air.  No LOBs or overt safety concerns.  Cuing for walker positioning and UE use.  Stairs            Wheelchair Mobility     Tilt Bed    Modified Rankin (Stroke Patients Only)       Balance Overall balance assessment: Modified Independent (with walker)                                           Pertinent Vitals/Pain Pain Assessment Pain Assessment: Faces Faces Pain Scale: Hurts a little bit Pain Location: chronic back pain    Home Living Family/patient expects to be discharged to:: Private residence Living Arrangements: Spouse/significant other Available Help at Discharge: Family;Available 24 hours/day Type of Home: House Home Access: Stairs to enter Entrance Stairs-Rails: Left Entrance Stairs-Number of Steps: 3   Home Layout: One level Home Equipment: Agricultural consultant (2 wheels);Rollator (4 wheels);Cane - single point;BSC/3in1;Grab bars - toilet;Shower seat;Cane - quad;Hand held shower head  Additional Comments: Tues/Thurs from 9am-4pm granddaughter has to work in her office, but otherwise she is there 24/7; also now has bed rails    Prior Function Prior Level of Function : Independent/Modified Independent             Mobility Comments: RW household distances only now; 1 recent fall less than a week ago and another in Feb/March ADLs Comments: granddaughter manages IADLs, provides supervision for bathing via sponge/sink bath; pt scared of shower;     Extremity/Trunk Assessment   Upper Extremity Assessment Upper Extremity Assessment: Overall  WFL for tasks assessed;Generalized weakness (age appropraite defecits)    Lower Extremity Assessment Lower Extremity Assessment: Overall WFL for tasks assessed;Generalized weakness (age appropriate defecits, L hip abd decreased)       Communication   Communication Communication: No apparent difficulties    Cognition Arousal: Alert Behavior During Therapy: WFL for tasks assessed/performed                           PT - Cognition Comments: Pt showed some confusion and repeating herself but pleasant and able to follow direction Following commands: Intact       Cueing       General Comments General comments (skin integrity, edema, etc.): Pt pleasant and eager to work with PT, ultimately able to do prolonged walk and did not need physical assist with mobility    Exercises     Assessment/Plan    PT Assessment Patient needs continued PT services  PT Problem List Decreased strength;Decreased range of motion;Decreased activity tolerance;Decreased knowledge of use of DME;Decreased safety awareness;Cardiopulmonary status limiting activity;Pain       PT Treatment Interventions DME instruction;Gait training;Stair training;Functional mobility training;Therapeutic activities;Therapeutic exercise;Balance training;Patient/family education    PT Goals (Current goals can be found in the Care Plan section)  Acute Rehab PT Goals Patient Stated Goal: go home tomorrow PT Goal Formulation: With patient Time For Goal Achievement: 02/22/24 Potential to Achieve Goals: Good    Frequency Min 2X/week     Co-evaluation               AM-PAC PT 6 Clicks Mobility  Outcome Measure Help needed turning from your back to your side while in a flat bed without using bedrails?: None Help needed moving from lying on your back to sitting on the side of a flat bed without using bedrails?: None Help needed moving to and from a bed to a chair (including a wheelchair)?: None Help needed  standing up from a chair using your arms (e.g., wheelchair or bedside chair)?: A Little Help needed to walk in hospital room?: A Little Help needed climbing 3-5 steps with a railing? : A Little 6 Click Score: 21    End of Session Equipment Utilized During Treatment: Gait belt Activity Tolerance: Patient tolerated treatment well;Patient limited by fatigue Patient left: with bed alarm set;with call bell/phone within reach Nurse Communication: Mobility status PT Visit Diagnosis: Muscle weakness (generalized) (M62.81);Difficulty in walking, not elsewhere classified (R26.2);History of falling (Z91.81)    Time: 2956-2130 PT Time Calculation (min) (ACUTE ONLY): 20 min   Charges:   PT Evaluation $PT Eval Low Complexity: 1 Low PT Treatments $Gait Training: 8-22 mins PT General Charges $$ ACUTE PT VISIT: 1 Visit         Darice Edelman, DPT 02/09/2024, 12:54 PM

## 2024-02-09 NOTE — Progress Notes (Signed)
 PROGRESS NOTE    Andrea Griffin  ZOX:[id number] DOB: [birth year] DOA: 02/08/2024 PCP: Germain Kohler, MD    Brief Narrative:  89 y.o. female with medical history significant of HTN, HLD, hyperthyroidism, nonobstructive CAD on cath November 2024, paroxysmal A-fib and left atrial appendage thrombus on Coumadin , cardiomyopathy (EF 40 to 45% on TEE 09/2023), depression, anemia, chronic hyponatremia, who presents with SOB, weakness and fall.   Patient was recently hospitalized from 3/2 - 3/7 due to CHF exacerbation and slurred speech.  Patient had MRI of brain which was negative for stroke.  Per patient and her granddaughter at the bedside, patient has weakness in the past several days.  She fell 3 days ago. She was seen in ED and had CT scan of head which was negative for acute injury yesterday.  Patient has mild SOB and and about 10 pound weight gain recently.  She has trace leg edema.  Patient was seen by PCP who checked sodium level which was 127.  Patient denies chest pain, fever, chills.  No nausea, vomiting, diarrhea or abdominal pain.  No symptoms of UTI.  Denies rectal bleeding or dark stool. When I saw pt in ED, she is alert, oriented x 3.   Assessment & Plan:   Principal Problem:   Acute on chronic systolic CHF (congestive heart failure) (HCC) Active Problems:   Hyponatremia   Persistent atrial fibrillation (HCC)   Thrombus of left atrial appendage   Nonobstructive CAD on [facility] 07/2023   Essential hypertension   Hyperthyroidism   HLD (hyperlipidemia)   Asthma   Fall at home, initial encounter   Depression  Acute on chronic systolic CHF (congestive heart failure) (HCC) Patient has SOB, 10 pound weight gain, elevated BNP 926, vascular testing on chest x-ray, clinically consistent with CHF exacerbation.  2D echo 09/20/2023 showed EF of 40-45%.   Plan: DC salt tablets Lasix  40 mg IV twice daily Continue home Aldactone  Weights, strict I's and O's, fluid restrict Hopeful to discharge  6/12   Hyponatremia:  Suspect hypervolemic hyponatremia in the setting of decompensated congestive heart failure Plan: Hold Entresto  Fluid restrict DC salt tabs   Persistent atrial fibrillation (HCC):  CHADS2 score is 6. HR 70s -Coumadin  per Pharm - Metoprolol    Thrombus of left atrial appendage -Coumadin    Nonobstructive CAD on [facility] 07/2023: No chest pain.  Troponin 8 --> 7 -Lipitor    Essential hypertension -Patient is on Lasix  and spironolactone  - Metoprolol  - IV hydralazine  as needed -Entresto  on hold   Hyperthyroidism -Continue methimazole    HLD (hyperlipidemia) -Lipitor    Asthma: Stable -Bronchodilators and as needed Mucinex    Fall at home, initial encounter -For precaution - PT/OT   Depression - Continue Lexapro      DVT prophylaxis: Coumadin  Code Status: DNR Family Communication: Granddaughter and husband at bedside 6/11 Disposition Plan:  Level of care: Telemetry Cardiac  Consultants:  None  Procedures:  None  Antimicrobials: None   Subjective: Seen and examined.  Resting in bed.  No visible distress.  Does endorse some shortness of breath but improving over interval.  Objective: Vitals:   02/09/24 0511 02/09/24 0800 02/09/24 1252 02/09/24 1459  BP:  139/67 (!) 120/57 103/74  Pulse:  89 76 70  Resp:  12 20 20   Temp: 98 F (36.7 C) 97.7 F (36.5 C) 98.4 F (36.9 C) 97.8 F (36.6 C)  TempSrc: Oral Oral Oral   SpO2:  92% 93% 95%    Intake/Output Summary (Last 24 hours) at 02/09/2024 1610 Last  data filed at 02/09/2024 0940 Gross per 24 hour  Intake --  Output 2100 ml  Net -2100 ml   There were no vitals filed for this visit.  Examination:  General exam: Appears calm and comfortable  Respiratory system: Bibasilar crackles.  Normal work of breathing.  Room air Cardiovascular system: S1-S2, regular rate, irregular rhythm, no murmurs, no pedal edema Gastrointestinal system: Soft, NT/ND, normal bowel sounds Central nervous  system: Alert and oriented. No focal neurological deficits. Extremities: Symmetric 5 x 5 power. Skin: No rashes, lesions or ulcers Psychiatry: Judgement and insight appear normal. Mood & affect appropriate.     Data Reviewed: I have personally reviewed following labs and imaging studies  CBC: Recent Labs  Lab 02/08/24 1410 02/09/24 0506  WBC 7.1 8.6  HGB 10.9* 11.3*  HCT 32.4* 33.8*  MCV 85.5 85.1  PLT 267 300   Basic Metabolic Panel: Recent Labs  Lab 02/08/24 1410 02/08/24 1828 02/09/24 0316 02/09/24 0506  NA 128* 129* 128*  --   K 4.0 3.4* 3.7  --   CL 98 94* 96*  --   CO2 23 24 22   --   GLUCOSE 86 99 81  --   BUN 16 15 13   --   CREATININE 0.42* 0.46 0.39*  --   CALCIUM  8.6* 8.9 8.7*  --   MG  --   --   --  1.7   GFR: CrCl cannot be calculated (Unknown ideal weight.). Liver Function Tests: No results for input(s): AST, ALT, ALKPHOS, BILITOT, PROT, ALBUMIN  in the last 168 hours. No results for input(s): LIPASE, AMYLASE in the last 168 hours. No results for input(s): AMMONIA in the last 168 hours. Coagulation Profile: Recent Labs  Lab 02/08/24 1410 02/09/24 0506  INR 2.2* 1.8*   Cardiac Enzymes: No results for input(s): CKTOTAL, CKMB, CKMBINDEX, TROPONINI in the last 168 hours. BNP (last 3 results) No results for input(s): PROBNP in the last 8760 hours. HbA1C: No results for input(s): HGBA1C in the last 72 hours. CBG: No results for input(s): GLUCAP in the last 168 hours. Lipid Profile: No results for input(s): CHOL, HDL, LDLCALC, TRIG, CHOLHDL, LDLDIRECT in the last 72 hours. Thyroid Function Tests: No results for input(s): TSH, T4TOTAL, FREET4, T3FREE, THYROIDAB in the last 72 hours. Anemia Panel: No results for input(s): VITAMINB12, FOLATE, FERRITIN, TIBC, IRON, RETICCTPCT in the last 72 hours. Sepsis Labs: No results for input(s): PROCALCITON, LATICACIDVEN in the last 168  hours.  No results found for this or any previous visit (from the past 240 hours).       Radiology Studies: DG Chest Port 1 View Result Date: 02/08/2024 CLINICAL DATA:  Dizziness. EXAM: PORTABLE CHEST 1 VIEW COMPARISON:  Chest CT dated 10/31/2023. FINDINGS: Cardiomegaly with vascular congestion. Overall similar or slight improvement in aeration of the lungs. Probable trace bilateral pleural effusions and bibasilar atelectasis. No pneumothorax. No acute osseous pathology. IMPRESSION: Cardiomegaly with vascular congestion. Electronically Signed   By: Angus Bark M.D.   On: 02/08/2024 15:08   CT HEAD WO CONTRAST ( ) Result Date: 02/07/2024 CLINICAL DATA:  Confusion, recent fall EXAM: CT HEAD WITHOUT CONTRAST TECHNIQUE: Contiguous axial images were obtained from the base of the skull through the vertex without intravenous contrast. RADIATION DOSE REDUCTION: This exam was performed according to the departmental dose-optimization program which includes automated exposure control, adjustment of the mA and/or kV according to patient size and/or use of iterative reconstruction technique. COMPARISON:  11/21/2023 FINDINGS: Brain: There is atrophy and chronic  small vessel disease changes. No acute intracranial abnormality. Specifically, no hemorrhage, hydrocephalus, mass lesion, acute infarction, or significant intracranial injury. Vascular: No hyperdense vessel or unexpected calcification. Skull: No acute calvarial abnormality. Sinuses/Orbits: No acute findings Other: None IMPRESSION: Atrophy, chronic microvascular disease. No acute intracranial abnormality. Electronically Signed   By: Janeece Mechanic M.D.   On: 02/07/2024 18:32        Scheduled Meds:  acetaminophen   1,000 mg Oral BID   atorvastatin   40 mg Oral Daily   calcium -vitamin D  1 tablet Oral q AM   escitalopram   5 mg Oral Daily   fluticasone  furoate-vilanterol  1 puff Inhalation Daily   furosemide   40 mg Intravenous Q12H   ketoconazole   1 Application Topical Daily   loratadine   10 mg Oral Daily   methimazole   2.5 mg Oral Daily   metoprolol  succinate  125 mg Oral Daily   pantoprazole   40 mg Oral BID   warfarin  2.5 mg Oral ONCE-1600   Warfarin - Pharmacist Dosing Inpatient   Does not apply q1600   Continuous Infusions:   LOS: 0 days     Tiajuana Fluke, MD [facility]   If 7PM-7AM, please contact night-coverage  02/09/2024, 4:10 PM

## 2024-02-09 NOTE — ED Notes (Signed)
 Suction canister emptied. 950 mL emptied at this time. Urine clear and yellow.

## 2024-02-09 NOTE — Consult Note (Signed)
 Agree with previous warfarin note. As patient is diuresed, we may see a trend down in INR. INR today is subtherapeutic (trending down). Agree with giving the patient home dose and we can see what the INR is tomorrow. If trending down may need to give a little bit more tomorrow compared to home dose. Warfarin dose already ordered by previous rph.   Harlie Lieu, PharmD, BCPS

## 2024-02-10 ENCOUNTER — Encounter: Payer: Self-pay | Admitting: Internal Medicine

## 2024-02-10 DIAGNOSIS — I5023 Acute on chronic systolic (congestive) heart failure: Secondary | ICD-10-CM | POA: Diagnosis not present

## 2024-02-10 LAB — CBC WITH DIFFERENTIAL/PLATELET
Abs Immature Granulocytes: 0.02 10*3/uL (ref 0.00–0.07)
Basophils Absolute: 0.1 10*3/uL (ref 0.0–0.1)
Basophils Relative: 1 %
Eosinophils Absolute: 0 10*3/uL (ref 0.0–0.5)
Eosinophils Relative: 1 %
HCT: 35.9 % — ABNORMAL LOW (ref 36.0–46.0)
Hemoglobin: 11.6 g/dL — ABNORMAL LOW (ref 12.0–15.0)
Immature Granulocytes: 0 %
Lymphocytes Relative: 39 %
Lymphs Abs: 2.6 10*3/uL (ref 0.7–4.0)
MCH: 27.6 pg (ref 26.0–34.0)
MCHC: 32.3 g/dL (ref 30.0–36.0)
MCV: 85.5 fL (ref 80.0–100.0)
Monocytes Absolute: 0.6 10*3/uL (ref 0.1–1.0)
Monocytes Relative: 10 %
Neutro Abs: 3.3 10*3/uL (ref 1.7–7.7)
Neutrophils Relative %: 49 %
Platelets: 344 10*3/uL (ref 150–400)
RBC: 4.2 MIL/uL (ref 3.87–5.11)
RDW: 14.9 % (ref 11.5–15.5)
WBC: 6.7 10*3/uL (ref 4.0–10.5)
nRBC: 0 % (ref 0.0–0.2)

## 2024-02-10 LAB — BASIC METABOLIC PANEL WITH GFR
Anion gap: 11 (ref 5–15)
BUN: 19 mg/dL (ref 8–23)
CO2: 26 mmol/L (ref 22–32)
Calcium: 8.9 mg/dL (ref 8.9–10.3)
Chloride: 93 mmol/L — ABNORMAL LOW (ref 98–111)
Creatinine, Ser: 0.53 mg/dL (ref 0.44–1.00)
GFR, Estimated: >60 mL/min (ref >60)
Glucose, Bld: 88 mg/dL (ref 70–99)
Potassium: 3.2 mmol/L — ABNORMAL LOW (ref 3.5–5.1)
Sodium: 130 mmol/L — ABNORMAL LOW (ref 135–145)

## 2024-02-10 LAB — PROTIME-INR
INR: 1.7 — ABNORMAL HIGH (ref 0.8–1.2)
Prothrombin Time: 19.8 s — ABNORMAL HIGH (ref 11.4–15.2)

## 2024-02-10 LAB — MAGNESIUM: Magnesium: 1.8 mg/dL (ref 1.7–2.4)

## 2024-02-10 LAB — MRSA NEXT GEN BY PCR, NASAL: MRSA by PCR Next Gen: NOT DETECTED

## 2024-02-10 MED ORDER — WARFARIN SODIUM 7.5 MG PO TABS
7.5000 mg | ORAL_TABLET | Freq: Once | ORAL | Status: AC
  Administered 2024-02-10: 7.5 mg via ORAL
  Filled 2024-02-10: qty 1

## 2024-02-10 MED ORDER — POTASSIUM CHLORIDE CRYS ER 20 MEQ PO TBCR
40.0000 meq | EXTENDED_RELEASE_TABLET | Freq: Once | ORAL | Status: AC
  Administered 2024-02-10: 40 meq via ORAL
  Filled 2024-02-10: qty 2

## 2024-02-10 NOTE — TOC Progression Note (Signed)
 Transition of Care [facility]) - Progression Note    Patient Details  Name: Andrea Griffin MRN: [id number] Date of Birth: [birth year]  Transition of Care [facility]) CM/SW Contact  Baird Bombard, RN Phone Number: 02/10/2024, 10:31 AM  Clinical Narrative:    Spoke with patient and her granddaughter regarding HH. Patient is currently active with [facility].   Spoke with [name]  from [facility] to confirm patient is active.          Expected Discharge Plan and Services                                               Social Determinants of Health (SDOH) Interventions SDOH Screenings   Food Insecurity: No Food Insecurity (02/09/2024)  Housing: Low Risk  (02/09/2024)  Transportation Needs: No Transportation Needs (02/10/2024)  Utilities: Not At Risk (02/10/2024)  Financial Resource Strain: Low Risk  (07/14/2023)   Received from [facility]  Social Connections: Socially Integrated (02/09/2024)  Tobacco Use: Low Risk  (02/10/2024)    Readmission Risk Interventions    11/02/2023    4:07 PM  Readmission Risk Prevention Plan  Transportation Screening Complete  PCP or Specialist Appt within 3-5 Days Complete  Social Work Consult for Recovery Care Planning/Counseling Complete  Palliative Care Screening Not Applicable  Medication Review Oceanographer) Complete

## 2024-02-10 NOTE — Consult Note (Signed)
 PHARMACY - ANTICOAGULATION CONSULT NOTE  Pharmacy Consult for warfarin Indication: hx of A fib and thrombus of left atrial appendage  Labs: Recent Labs    02/08/24 1410 02/08/24 1627 02/08/24 1828 02/09/24 0316 02/09/24 0506 02/10/24 0610  HGB 10.9*  --   --   --  11.3* 11.6*  HCT 32.4*  --   --   --  33.8* 35.9*  PLT 267  --   --   --  300 344  LABPROT 24.5*  --   --   --  21.5*  --   INR 2.2*  --   --   --  1.8*  --   CREATININE 0.42*  --  0.46 0.39*  --  0.53  TROPONINIHS 8 7  --   --   --   --    Estimated Creatinine Clearance: 39 mL/min (by C-G formula based on SCr of 0.53 mg/dL).  Medical History: Past Medical History:  Diagnosis Date   Anxiety    Arthritis    Asthma    Cancer (HCC)    melanoma skin cancer on head   Diastolic dysfunction    a. 05/2023 Echo: EF 70-76^, no rwma, GrII DD, nl RV fxn, mildly dil RA, mod dil LA. Mild MR. Mod AI.   GERD (gastroesophageal reflux disease)    History of recurrent cystitis    Hyperlipidemia    Hypertension    Infection of prosthetic hip joint (HCC)    Mitral regurgitation    Moderate aortic insufficiency    Nocturia    Obesity    Osteopenia    Persistent atrial fibrillation (HCC)    Thyroid disease    Urinary frequency    Urinary incontinence    Urinary urgency    Wears glasses    Medications:  warfarin (COUMADIN ) 2.5 MG tablet Take one tablet, 2.5mg , on Wednesdays and take (2) tablets, 5mg , all other days  Per previous [facility] record 01/12/2024; last fill was 160 tablets for an 86 day supply  Confirmed by the granddaughter  Last does taken the morning of 6/10   Assessment: JM is a 89 yo female who presented to the ED due to ongoing difficulty breathing. They have a past medical history significant for atrial fibrillation on warfarin and CHF. Over the last couple of days they've experienced weight gain (10 lbs in last 2 weeks) and shortness of breath. They were started on lasix  daily after it was held due to the  development of hyponatremia. They were admitted to the hospital due to weakness in the setting of a CHF exacerbation. Pharmacy has been consulted to manage this patient's warfarin.   Baseline labs Hgb 10.9 Plt 267  Goal of Therapy:  INR range 2.0 - 3.0 (desired range 2.0 - 2.5 INR goal per granddaughter; previously 3/25 the desired target was 2.5 - 3.0 due to thrombus)  Monitor platelets by anticoagulation protocol: Yes  INR 6/10  INR = 2.2 Therapeutic  5 mg 6/11 INR = 1.8 Subthera 2.5 mg 6/12 INR = 1.7 Subthera 7.5 mg   Plan:  --INR is subtherapeutic --Will give 7.5 mg warfarin tonight (1.5 x home dose) --Anticipate returning to home dosing regimen thereafter --Daily INR per protocol  Thank you for allowing pharmacy to participate in this patient's care.   Page Boast 02/10/2024 7:54 AM

## 2024-02-10 NOTE — Progress Notes (Signed)
 PROGRESS NOTE    Andrea Griffin  ZOX:[id number] DOB: [birth year] DOA: 02/08/2024 PCP: Germain Kohler, MD    Brief Narrative:  89 y.o. female with medical history significant of HTN, HLD, hyperthyroidism, nonobstructive CAD on cath November 2024, paroxysmal A-fib and left atrial appendage thrombus on Coumadin , cardiomyopathy (EF 40 to 45% on TEE 09/2023), depression, anemia, chronic hyponatremia, who presents with SOB, weakness and fall.   Patient was recently hospitalized from 3/2 - 3/7 due to CHF exacerbation and slurred speech.  Patient had MRI of brain which was negative for stroke.  Per patient and her granddaughter at the bedside, patient has weakness in the past several days.  She fell 3 days ago. She was seen in ED and had CT scan of head which was negative for acute injury yesterday.  Patient has mild SOB and and about 10 pound weight gain recently.  She has trace leg edema.  Patient was seen by PCP who checked sodium level which was 127.  Patient denies chest pain, fever, chills.  No nausea, vomiting, diarrhea or abdominal pain.  No symptoms of UTI.  Denies rectal bleeding or dark stool. When I saw pt in ED, she is alert, oriented x 3.   Assessment & Plan:   Principal Problem:   Acute on chronic systolic CHF (congestive heart failure) (HCC) Active Problems:   Hyponatremia   Persistent atrial fibrillation (HCC)   Thrombus of left atrial appendage   Nonobstructive CAD on [facility] 07/2023   Essential hypertension   Hyperthyroidism   HLD (hyperlipidemia)   Asthma   Fall at home, initial encounter   Depression  Acute on chronic systolic CHF (congestive heart failure) (HCC) Patient has SOB, 10 pound weight gain, elevated BNP 926, vascular testing on chest x-ray, clinically consistent with CHF exacerbation.  2D echo 09/20/2023 showed EF of 40-45%..  Patient has been diuresing appropriately Plan: Continue Lasix  40 mg IV twice daily for 1 additional day.  Hope to transition back to home  diuretic regimen on 6/13.  Continue home Aldactone .  Continue daily weights and strict I's and O's and fluid restriction.  Anticipate discharge 6/13.  Hyponatremia:  Suspect hypervolemic hyponatremia in the setting of decompensated congestive heart failure.  Sodium improving with diuresis. Plan: Hold Entresto  Fluid restrict   Persistent atrial fibrillation (HCC):  CHA2DS2-VASc or 6.  Heart rate controlled.  Continue Coumadin  per pharmacy.  Continue metoprolol .   Thrombus of left atrial appendage On Coumadin .   Nonobstructive CAD on [facility] 07/2023: No chest pain.  Troponin 8 --> 7 PTA Lipitor    Essential hypertension Continue on Lasix , Aldactone , metoprolol .  Entresto  on hold.  Can likely restart 6/13  Hyperthyroidism PTA Tapazole    HLD (hyperlipidemia) PTA patellar   Asthma: Stable Continue bronchodilators   Fall at home, initial encounter Fall precautions.  Therapy evaluations.  Anticipate home with home health 6/13.   Depression PTA Lexapro      DVT prophylaxis: Coumadin  Code Status: DNR Family Communication: Granddaughter and husband at bedside 6/11 Disposition Plan: Status is: Observation The patient will require care spanning > 2 midnights and should be moved to inpatient because: Acute decompensated heart failure on IV diuretic   Level of care: Telemetry Cardiac  Consultants:  None  Procedures:  None  Antimicrobials: None   Subjective: Seen and examined.  Resting in bed.  No visible distress.  Shortness of breath improving.  Still not at baseline.  Objective: Vitals:   02/10/24 0331 02/10/24 0500 02/10/24 0829 02/10/24 1110  BP: Aaron Aas)  152/74  (!) 149/85 (!) 106/49  Pulse: 64  69 88  Resp: 14   16  Temp: (!) 97.4 F (36.3 C)  (!) 97.5 F (36.4 C) 98.3 F (36.8 C)  TempSrc:   Oral Oral  SpO2: 94%  95% 94%  Weight:  60.2 kg      Intake/Output Summary (Last 24 hours) at 02/10/2024 1325 Last data filed at 02/10/2024 1051 Gross per 24 hour  Intake  480 ml  Output 950 ml  Net -470 ml   Filed Weights   02/10/24 0500  Weight: 60.2 kg    Examination:  General exam: NAD Respiratory system: Crackles at bases.  Normal work of breathing.  Room air Cardiovascular system: S1-S2, regular rate, irregular rhythm, no murmurs, no pedal edema Gastrointestinal system: Soft, NT/ND, normal bowel sounds Central nervous system: Alert and oriented. No focal neurological deficits. Extremities: Symmetric 5 x 5 power. Skin: No rashes, lesions or ulcers Psychiatry: Judgement and insight appear normal. Mood & affect appropriate.     Data Reviewed: I have personally reviewed following labs and imaging studies  CBC: Recent Labs  Lab 02/08/24 1410 02/09/24 0506 02/10/24 0610  WBC 7.1 8.6 6.7  NEUTROABS  --   --  3.3  HGB 10.9* 11.3* 11.6*  HCT 32.4* 33.8* 35.9*  MCV 85.5 85.1 85.5  PLT 267 300 344   Basic Metabolic Panel: Recent Labs  Lab 02/08/24 1410 02/08/24 1828 02/09/24 0316 02/09/24 0506 02/10/24 0610  NA 128* 129* 128*  --  130*  K 4.0 3.4* 3.7  --  3.2*  CL 98 94* 96*  --  93*  CO2 23 24 22   --  26  GLUCOSE 86 99 81  --  88  BUN 16 15 13   --  19  CREATININE 0.42* 0.46 0.39*  --  0.53  CALCIUM  8.6* 8.9 8.7*  --  8.9  MG  --   --   --  1.7 1.8   GFR: Estimated Creatinine Clearance: 39 mL/min (by C-G formula based on SCr of 0.53 mg/dL). Liver Function Tests: No results for input(s): AST, ALT, ALKPHOS, BILITOT, PROT, ALBUMIN  in the last 168 hours. No results for input(s): LIPASE, AMYLASE in the last 168 hours. No results for input(s): AMMONIA in the last 168 hours. Coagulation Profile: Recent Labs  Lab 02/08/24 1410 02/09/24 0506 02/10/24 0816  INR 2.2* 1.8* 1.7*   Cardiac Enzymes: No results for input(s): CKTOTAL, CKMB, CKMBINDEX, TROPONINI in the last 168 hours. BNP (last 3 results) No results for input(s): PROBNP in the last 8760 hours. HbA1C: No results for input(s): HGBA1C in  the last 72 hours. CBG: No results for input(s): GLUCAP in the last 168 hours. Lipid Profile: No results for input(s): CHOL, HDL, LDLCALC, TRIG, CHOLHDL, LDLDIRECT in the last 72 hours. Thyroid Function Tests: No results for input(s): TSH, T4TOTAL, FREET4, T3FREE, THYROIDAB in the last 72 hours. Anemia Panel: No results for input(s): VITAMINB12, FOLATE, FERRITIN, TIBC, IRON, RETICCTPCT in the last 72 hours. Sepsis Labs: No results for input(s): PROCALCITON, LATICACIDVEN in the last 168 hours.  Recent Results (from the past 240 hours)  MRSA Next Gen by PCR, Nasal     Status: None   Collection Time: 02/10/24  4:50 AM   Specimen: Nasal Mucosa; Nasal Swab  Result Value Ref Range Status   MRSA by PCR Next Gen NOT DETECTED NOT DETECTED Final    Comment: (NOTE) The GeneXpert MRSA Assay (FDA approved for NASAL specimens only), is one component  of a comprehensive MRSA colonization surveillance program. It is not intended to diagnose MRSA infection nor to guide or monitor treatment for MRSA infections. Test performance is not FDA approved in patients less than 4 years old. Performed at [facility], [address] [place], [place] [address]          Radiology Studies: [facility] Chest [place] 1 View Result Date: 02/08/2024 CLINICAL DATA:  Dizziness. EXAM: PORTABLE CHEST 1 VIEW COMPARISON:  Chest CT dated 10/31/2023. FINDINGS: Cardiomegaly with vascular congestion. Overall similar or slight improvement in aeration of the lungs. Probable trace bilateral pleural effusions and bibasilar atelectasis. No pneumothorax. No acute osseous pathology. IMPRESSION: Cardiomegaly with vascular congestion. Electronically Signed   By: Angus Bark M.D.   On: 02/08/2024 15:08        Scheduled Meds:  acetaminophen   1,000 mg Oral BID   atorvastatin   40 mg Oral Daily   calcium -vitamin D  1 tablet Oral q AM   escitalopram   5 mg Oral Daily   fluticasone   furoate-vilanterol  1 puff Inhalation Daily   furosemide   40 mg Intravenous Q12H   ketoconazole  1 Application Topical Daily   loratadine   10 mg Oral Daily   methimazole   2.5 mg Oral Daily   metoprolol  succinate  125 mg Oral Daily   pantoprazole   40 mg Oral BID   warfarin  7.5 mg Oral ONCE-1600   Warfarin - Pharmacist Dosing Inpatient   Does not apply q1600   Continuous Infusions:   LOS: 0 days     Tiajuana Fluke, MD [facility]   If 7PM-7AM, please contact night-coverage  02/10/2024, 1:25 PM

## 2024-02-10 NOTE — Progress Notes (Signed)
 Heart Failure Navigator Progress Note  Assessed for Heart & Vascular TOC clinic readiness.  Patient does not meet criteria due to current [facility] patient.   Navigator will sign off at this time.  Roxy Horseman, RN, BSN [facility] Heart Failure Navigator Secure Chat Only

## 2024-02-10 NOTE — Plan of Care (Signed)

## 2024-02-11 DIAGNOSIS — I5023 Acute on chronic systolic (congestive) heart failure: Secondary | ICD-10-CM | POA: Diagnosis not present

## 2024-02-11 LAB — BASIC METABOLIC PANEL WITH GFR
Anion gap: 11 (ref 5–15)
BUN: 33 mg/dL — ABNORMAL HIGH (ref 8–23)
CO2: 24 mmol/L (ref 22–32)
Calcium: 8.7 mg/dL — ABNORMAL LOW (ref 8.9–10.3)
Chloride: 96 mmol/L — ABNORMAL LOW (ref 98–111)
Creatinine, Ser: 0.69 mg/dL (ref 0.44–1.00)
GFR, Estimated: >60 mL/min (ref >60)
Glucose, Bld: 91 mg/dL (ref 70–99)
Potassium: 4 mmol/L (ref 3.5–5.1)
Sodium: 131 mmol/L — ABNORMAL LOW (ref 135–145)

## 2024-02-11 LAB — MAGNESIUM: Magnesium: 1.9 mg/dL (ref 1.7–2.4)

## 2024-02-11 LAB — PROTIME-INR
INR: 1.7 — ABNORMAL HIGH (ref 0.8–1.2)
Prothrombin Time: 20.5 s — ABNORMAL HIGH (ref 11.4–15.2)

## 2024-02-11 MED ORDER — WARFARIN SODIUM 5 MG PO TABS
5.0000 mg | ORAL_TABLET | Freq: Once | ORAL | Status: DC
  Filled 2024-02-11: qty 1

## 2024-02-11 MED ORDER — SPIRONOLACTONE 25 MG PO TABS
25.0000 mg | ORAL_TABLET | Freq: Every day | ORAL | 0 refills | Status: AC
Start: 2024-02-11 — End: ?

## 2024-02-11 NOTE — TOC Transition Note (Addendum)
 Transition of Care [facility]) - Discharge Note   Patient Details  Name: Andrea Griffin MRN: [id number] Date of Birth: 23-Sep-1931  Transition of Care [facility]) CM/SW Contact:  Schelly Chuba C Darrik Richman, RN Phone Number: 02/11/2024, 10:44 AM   Clinical Narrative:    [name]  from [facility] notified of discharge today.   RNCM spoke with patient's granddaughter Almira Armour to advised [facility] will contact her to schedule resumption of care appointment.     TOC signing off.           Patient Goals and CMS Choice            Discharge Placement                       Discharge Plan and Services Additional resources added to the After Visit Summary for                                       Social Drivers of Health (SDOH) Interventions SDOH Screenings   Food Insecurity: No Food Insecurity (02/09/2024)  Housing: Low Risk  (02/09/2024)  Transportation Needs: No Transportation Needs (02/10/2024)  Utilities: Not At Risk (02/10/2024)  Financial Resource Strain: Low Risk  (07/14/2023)   Received from [facility]  Social Connections: Socially Integrated (02/09/2024)  Tobacco Use: Low Risk  (02/10/2024)     Readmission Risk Interventions    11/02/2023    4:07 PM  Readmission Risk Prevention Plan  Transportation Screening Complete  PCP or Specialist Appt within 3-5 Days Complete  Social Work Consult for Recovery Care Planning/Counseling Complete  Palliative Care Screening Not Applicable  Medication Review Oceanographer) Complete

## 2024-02-11 NOTE — Progress Notes (Signed)
 Occupational Therapy Treatment Patient Details Name: Andrea Griffin MRN: [id number] DOB: 1932-02-03 Today's Date: 02/11/2024   History of present illness 89 y.o. female with medical history significant of HTN, HLD, hyperthyroidism, nonobstructive CAD on cath November 2024, paroxysmal A-fib and left atrial appendage thrombus on Coumadin , cardiomyopathy (EF 40 to 45% on TEE 09/2023), depression, anemia, chronic hyponatremia, who presents with SOB, weakness and fall.   OT comments  Pt is supine in bed on arrival. Pleasant and agreeable to OT session. She denies pain. Pt performed bed mobility MOD I, standing/seated ADLs with set up to supervision. She ambulated ~90 feet with RW use and supervision without LOB and VSS. Pt returned to bed with all needs in place and will cont to require skilled acute OT services to maximize her safety and IND to return to PLOF. She is anxious to get home today.       If plan is discharge home, recommend the following:  A little help with walking and/or transfers;A little help with bathing/dressing/bathroom   Equipment Recommendations  None recommended by OT    Recommendations for Other Services      Precautions / Restrictions Precautions Precautions: Fall Recall of Precautions/Restrictions: Intact Restrictions Weight Bearing Restrictions Per Provider Order: No       Mobility Bed Mobility Overal bed mobility: Modified Independent                  Transfers Overall transfer level: Needs assistance Equipment used: Rolling walker (2 wheels) Transfers: Sit to/from Stand Sit to Stand: Supervision           General transfer comment: supervision for STS and mobility ~90 feet  with RW use     Balance Overall balance assessment: No apparent balance deficits (not formally assessed)                                         ADL either performed or assessed with clinical judgement   ADL Overall ADL's : Needs  assistance/impaired     Grooming: Wash/dry hands;Supervision/safety;Applying deodorant;Brushing hair;Oral care;Wash/dry face;Sitting;Standing           Upper Body Dressing : Set up;Sitting   Lower Body Dressing: Supervision/safety;Sitting/lateral leans;Sit to/from stand   Toilet Transfer: BSC/3in1;Rolling walker (2 wheels);Supervision/safety   Toileting- Architect and Hygiene: Supervision/safety;Sitting/lateral lean              Extremity/Trunk Sales promotion account executive Communication: No apparent difficulties   Cognition Arousal: Alert Behavior During Therapy: WFL for tasks assessed/performed                                 Following commands: Intact        Cueing      Exercises      Shoulder Instructions       General Comments      Pertinent Vitals/ Pain       Pain Assessment Pain Assessment: No/denies pain  Home Living  Prior Functioning/Environment              Frequency  Min 2X/week        Progress Toward Goals  OT Goals(current goals can now be found in the care plan section)  Progress towards OT goals: Progressing toward goals  Acute Rehab OT Goals Patient Stated Goal: go home OT Goal Formulation: With patient/family Time For Goal Achievement: 02/23/24 Potential to Achieve Goals: Good  Plan      Co-evaluation                 AM-PAC OT 6 Clicks Daily Activity     Outcome Measure   Help from another person eating meals?: None Help from another person taking care of personal grooming?: None Help from another person toileting, which includes using toliet, bedpan, or urinal?: None Help from another person bathing (including washing, rinsing, drying)?: None Help from another person to put on and taking off regular upper body clothing?: None Help from another  person to put on and taking off regular lower body clothing?: None 6 Click Score: 24    End of Session Equipment Utilized During Treatment: Rolling walker (2 wheels)  OT Visit Diagnosis: Other abnormalities of gait and mobility (R26.89);Muscle weakness (generalized) (M62.81)   Activity Tolerance Patient tolerated treatment well   Patient Left with call bell/phone within reach;in bed;with bed alarm set;with family/visitor present   Nurse Communication Mobility status        Time: 1610-9604 OT Time Calculation (min): 23 min  Charges: OT General Charges $OT Visit: 1 Visit OT Treatments $Self Care/Home Management : 8-22 mins $Therapeutic Activity: 8-22 mins  Chelise Hanger, OTR/L  02/11/24, 11:32 AM   Sly Parlee E Zygmund Passero 02/11/2024, 11:30 AM

## 2024-02-11 NOTE — Plan of Care (Signed)

## 2024-02-11 NOTE — Consult Note (Signed)
 PHARMACY - ANTICOAGULATION CONSULT NOTE  Pharmacy Consult for warfarin Indication: hx of A fib and thrombus of left atrial appendage  Labs: Recent Labs    02/08/24 1410 02/08/24 1627 02/08/24 1828 02/09/24 0316 02/09/24 0506 02/10/24 0610 02/10/24 0816 02/11/24 0514  HGB 10.9*  --   --   --  11.3* 11.6*  --   --   HCT 32.4*  --   --   --  33.8* 35.9*  --   --   PLT 267  --   --   --  300 344  --   --   LABPROT 24.5*  --   --   --  21.5*  --  19.8* 20.5*  INR 2.2*  --   --   --  1.8*  --  1.7* 1.7*  CREATININE 0.42*  --  0.46 0.39*  --  0.53  --   --   TROPONINIHS 8 7  --   --   --   --   --   --    Estimated Creatinine Clearance: 38.8 mL/min (by C-G formula based on SCr of 0.53 mg/dL).  Medical History: Past Medical History:  Diagnosis Date   Anxiety    Arthritis    Asthma    Cancer (HCC)    melanoma skin cancer on head   Diastolic dysfunction    a. 05/2023 Echo: EF 70-76^, no rwma, GrII DD, nl RV fxn, mildly dil RA, mod dil LA. Mild MR. Mod AI.   GERD (gastroesophageal reflux disease)    History of recurrent cystitis    Hyperlipidemia    Hypertension    Infection of prosthetic hip joint (HCC)    Mitral regurgitation    Moderate aortic insufficiency    Nocturia    Obesity    Osteopenia    Persistent atrial fibrillation (HCC)    Thyroid disease    Urinary frequency    Urinary incontinence    Urinary urgency    Wears glasses    Medications:  warfarin (COUMADIN ) 2.5 MG tablet Take one tablet, 2.5mg , on Wednesdays and take (2) tablets, 5mg , all other days  Per previous [facility] record 01/12/2024; last fill was 160 tablets for an 86 day supply  Confirmed by the granddaughter  Last does taken the morning of 6/10   Assessment: JM is a 89 yo female who presented to the ED due to ongoing difficulty breathing. They have a past medical history significant for atrial fibrillation on warfarin and CHF. Over the last couple of days they've experienced weight gain (10 lbs in  last 2 weeks) and shortness of breath. They were started on lasix  daily after it was held due to the development of hyponatremia. They were admitted to the hospital due to weakness in the setting of a CHF exacerbation. Pharmacy has been consulted to manage this patient's warfarin.   Baseline labs Hgb 10.9 Plt 267  Goal of Therapy:  INR range 2.0 - 3.0 (desired range 2.0 - 2.5 INR goal per granddaughter; previously 3/25 the desired target was 2.5 - 3.0 due to thrombus)  Monitor platelets by anticoagulation protocol: Yes  INR 6/10  INR = 2.2 Therapeutic  5 mg 6/11 INR = 1.8 Subthera 2.5 mg 6/12 INR = 1.7 Subthera 7.5 mg 6/13 INR = 1.7 Subthera 5 mg   Plan:  --INR is subtherapeutic --Will give 5 mg warfarin tonight (home dose) --Daily INR per protocol  Thank you for allowing pharmacy to participate in this patient's care.  Jerrik Housholder B Indianna Boran 02/11/2024 7:17 AM

## 2024-02-11 NOTE — Discharge Summary (Signed)
 Physician Discharge Summary  Andrea Griffin KGM:[id number] DOB: 1931/11/27 DOA: 02/08/2024  PCP: Germain Kohler, MD  Admit date: 02/08/2024 Discharge date: 02/11/2024  Admitted From: Home Disposition:  Home w home health  Recommendations for Outpatient Follow-up:  Follow up with PCP in 1-2 weeks Follow-up [facility] cardiology 1 week  Home Health: Yes PT OT RN Equipment/Devices: None  Discharge Condition: Stable CODE STATUS: DNR Diet recommendation: Heart healthy  Brief/Interim Summary:  89 y.o. female with medical history significant of HTN, HLD, hyperthyroidism, nonobstructive CAD on cath November 2024, paroxysmal A-fib and left atrial appendage thrombus on Coumadin , cardiomyopathy (EF 40 to 45% on TEE 09/2023), depression, anemia, chronic hyponatremia, who presents with SOB, weakness and fall.   Patient was recently hospitalized from 3/2 - 3/7 due to CHF exacerbation and slurred speech.  Patient had MRI of brain which was negative for stroke.  Per patient and her granddaughter at the bedside, patient has weakness in the past several days.  She fell 3 days ago. She was seen in ED and had CT scan of head which was negative for acute injury yesterday.  Patient has mild SOB and and about 10 pound weight gain recently.  She has trace leg edema.  Patient was seen by PCP who checked sodium level which was 127.  Patient denies chest pain, fever, chills.  No nausea, vomiting, diarrhea or abdominal pain.  No symptoms of UTI.  Denies rectal bleeding or dark stool. When I saw pt in ED, she is alert, oriented x 3.      Discharge Diagnoses:  Principal Problem:   Acute on chronic systolic CHF (congestive heart failure) (HCC) Active Problems:   Hyponatremia   Persistent atrial fibrillation (HCC)   Thrombus of left atrial appendage   Nonobstructive CAD on [facility] 07/2023   Essential hypertension   Hyperthyroidism   HLD (hyperlipidemia)   Asthma   Fall at home, initial encounter    Depression  Acute on chronic systolic CHF (congestive heart failure) (HCC) Patient has SOB, 10 pound weight gain, elevated BNP 926, vascular testing on chest x-ray, clinically consistent with CHF exacerbation.  2D echo 09/20/2023 showed EF of 40-45%..  Patient has been diuresing appropriately Plan: Volume status optimized at time of discharge.  Discontinue IV diuretic.  On discharge will recommend Lasix  40 mg p.o. daily and Aldactone  25 mg p.o. daily.  Patient and family aware that this medication regimen may be further adjusted by cardiology and/or PCP in clinic.  Patient stated they have a appointment to see outpatient cardiology within 1 week of discharge.   Hyponatremia:  Suspect hypervolemic hyponatremia in the setting of decompensated congestive heart failure.  Sodium improving with diuresis. Plan: Recommend 1.5 L daily fluid restriction on DC.  Recommendations discussed with patient and patient's granddaughter at bedside.  Discharge Instructions  Discharge Instructions     Diet - low sodium heart healthy   Complete by: As directed    Increase activity slowly   Complete by: As directed       Allergies as of 02/11/2024       Reactions   Ciprofloxacin Hives   Adhesive [tape]    Pulls off skin   Nitrofurantoin Other (See Comments)   Can not remember reaction   Phenazopyridine Other (See Comments)   Pt doesn't remember   Ranitidine Other (See Comments)   Can not remember reaction   Sulfa Antibiotics Other (See Comments)   Can not remember reaction        Medication List  STOP taking these medications    HYDROcodone -acetaminophen  5-325 MG tablet Commonly known as: NORCO/VICODIN   sacubitril -valsartan  24-26 MG Commonly known as: ENTRESTO        TAKE these medications    acetaminophen  325 MG tablet Commonly known as: TYLENOL  Take 2 tablets (650 mg total) by mouth every 6 (six) hours as needed for mild pain (pain score 1-3), fever or headache (or Fever >/=  101).   atorvastatin  40 MG tablet Commonly known as: LIPITOR  Take 1 tablet (40 mg total) by mouth daily.   BLUE-EMU PAIN RELIEF DRY EX Apply 1 application  topically 2 (two) times daily as needed (Back pain).   Calcium  600+D 600-800 MG-UNIT Tabs Generic drug: Calcium  Carb-Cholecalciferol  Take 2 tablets by mouth in the morning.   escitalopram  5 MG tablet Commonly known as: LEXAPRO  Take 5 mg by mouth daily.   fexofenadine 180 MG tablet Commonly known as: ALLEGRA Take 180 mg by mouth daily.   fluticasone  50 MCG/ACT nasal spray Commonly known as: FLONASE  Place 2 sprays into both nostrils daily.   Fluticasone -Salmeterol 100-50 MCG/DOSE Aepb Commonly known as: ADVAIR Inhale 1 puff into the lungs every 12 (twelve) hours.   furosemide  40 MG tablet Commonly known as: LASIX  Take 1 tablet (40 mg total) by mouth daily. Increase to 1 tablet (40 mg total) by mouth TWICE daily (total daily dose 80 mg) as needed for up to 2 days for increased leg swelling, shortness of breath, weight gain 5+ lbs over 1-2 days. Seek medical care if these symptoms are not improving with increased dose.   guaiFENesin  600 MG 12 hr tablet Commonly known as: MUCINEX  Take 1 tablet (600 mg total) by mouth 2 (two) times daily.   ketoconazole  2 % cream Commonly known as: NIZORAL  Apply 1 Application topically daily.   methimazole  5 MG tablet Commonly known as: TAPAZOLE  Take 2.5 mg by mouth daily.   metoprolol  succinate 50 MG 24 hr tablet Commonly known as: TOPROL -XL Take 2.5 tablets (125 mg total) by mouth daily. Take with or immediately following a meal.   omeprazole 20 MG capsule Commonly known as: PRILOSEC Take 20 mg by mouth 2 (two) times daily.   senna-docusate 8.6-50 MG tablet Commonly known as: Senokot-S Take 1 tablet by mouth daily as needed for mild constipation.   spironolactone  25 MG tablet Commonly known as: ALDACTONE  Take 1 tablet (25 mg total) by mouth daily.   warfarin 2.5 MG  tablet Commonly known as: COUMADIN  2.5 mg (one tablet) on Tue/Thurs/Sat, and 5 mg (two tablets) on Mon, Wed, Fri, and Sun (Total weekly dose: 27.5)        Follow-up Information     Alluri, Odessa Bene, MD. Go in 5 day(s).   Specialty: Cardiology Why: Appointment scheduled with Dr. Bob Burn on 6/16 at 10:45 AM Contact information: [address] [place] [address] [phone]                Allergies  Allergen Reactions   Ciprofloxacin Hives   Adhesive [Tape]     Pulls off skin   Nitrofurantoin Other (See Comments)    Can not remember reaction   Phenazopyridine Other (See Comments)    Pt doesn't remember   Ranitidine Other (See Comments)    Can not remember reaction   Sulfa Antibiotics Other (See Comments)    Can not remember reaction    Consultations: None   Procedures/Studies: DG Chest Port 1 View Result Date: 02/08/2024 CLINICAL DATA:  Dizziness. EXAM: PORTABLE CHEST 1 VIEW  COMPARISON:  Chest CT dated 10/31/2023. FINDINGS: Cardiomegaly with vascular congestion. Overall similar or slight improvement in aeration of the lungs. Probable trace bilateral pleural effusions and bibasilar atelectasis. No pneumothorax. No acute osseous pathology. IMPRESSION: Cardiomegaly with vascular congestion. Electronically Signed   By: Angus Bark M.D.   On: 02/08/2024 15:08   CT HEAD WO CONTRAST ( ) Result Date: 02/07/2024 CLINICAL DATA:  Confusion, recent fall EXAM: CT HEAD WITHOUT CONTRAST TECHNIQUE: Contiguous axial images were obtained from the base of the skull through the vertex without intravenous contrast. RADIATION DOSE REDUCTION: This exam was performed according to the departmental dose-optimization program which includes automated exposure control, adjustment of the mA and/or kV according to patient size and/or use of iterative reconstruction technique. COMPARISON:  11/21/2023 FINDINGS: Brain: There is atrophy and chronic small vessel disease changes. No acute  intracranial abnormality. Specifically, no hemorrhage, hydrocephalus, mass lesion, acute infarction, or significant intracranial injury. Vascular: No hyperdense vessel or unexpected calcification. Skull: No acute calvarial abnormality. Sinuses/Orbits: No acute findings Other: None IMPRESSION: Atrophy, chronic microvascular disease. No acute intracranial abnormality. Electronically Signed   By: Janeece Mechanic M.D.   On: 02/07/2024 18:32      Subjective: Seen and examined on the day of discharge.  Stable no distress.  Appropriate for discharge home.  Discharge Exam: Vitals:   02/11/24 0338 02/11/24 0845  BP: (!) 158/143 132/73  Pulse: 92 71  Resp: 18   Temp: 98.2 F (36.8 C) 97.6 F (36.4 C)  SpO2: 96% 97%   Vitals:   02/10/24 2324 02/11/24 0338 02/11/24 0500 02/11/24 0845  BP: 118/71 (!) 158/143  132/73  Pulse: 96 92  71  Resp: 18 18    Temp: 97.7 F (36.5 C) 98.2 F (36.8 C)  97.6 F (36.4 C)  TempSrc:  Oral    SpO2: 94% 96%  97%  Weight:   59.8 kg     General: Pt is alert, awake, not in acute distress Cardiovascular: RRR, S1/S2 +, no rubs, no gallops Respiratory: CTA bilaterally, no wheezing, no rhonchi Abdominal: Soft, NT, ND, bowel sounds + Extremities: no edema, no cyanosis    The results of significant diagnostics from this hospitalization (including imaging, microbiology, ancillary and laboratory) are listed below for reference.     Microbiology: Recent Results (from the past 240 hours)  MRSA Next Gen by PCR, Nasal     Status: None   Collection Time: 02/10/24  4:50 AM   Specimen: Nasal Mucosa; Nasal Swab  Result Value Ref Range Status   MRSA by PCR Next Gen NOT DETECTED NOT DETECTED Final    Comment: (NOTE) The GeneXpert MRSA Assay (FDA approved for NASAL specimens only), is one component of a comprehensive MRSA colonization surveillance program. It is not intended to diagnose MRSA infection nor to guide or monitor treatment for MRSA infections. Test  performance is not FDA approved in patients less than 45 years old. Performed at [facility], [address] [place], [place] [address]      Labs: BNP (last 3 results) Recent Labs    10/31/23 1545 02/08/24 1410  BNP 818.6* 926.4*   Basic Metabolic Panel: Recent Labs  Lab 02/08/24 1410 02/08/24 1828 02/09/24 0316 02/09/24 0506 02/10/24 0610 02/11/24 0514  NA 128* 129* 128*  --  130* 131*  K 4.0 3.4* 3.7  --  3.2* 4.0  CL 98 94* 96*  --  93* 96*  CO2 23 24 22   --  26 24  GLUCOSE 86 99  81  --  88 91  BUN 16 15 13   --  19 33*  CREATININE 0.42* 0.46 0.39*  --  0.53 0.69  CALCIUM  8.6* 8.9 8.7*  --  8.9 8.7*  MG  --   --   --  1.7 1.8 1.9   Liver Function Tests: No results for input(s): AST, ALT, ALKPHOS, BILITOT, PROT, ALBUMIN  in the last 168 hours. No results for input(s): LIPASE, AMYLASE in the last 168 hours. No results for input(s): AMMONIA in the last 168 hours. CBC: Recent Labs  Lab 02/08/24 1410 02/09/24 0506 02/10/24 0610  WBC 7.1 8.6 6.7  NEUTROABS  --   --  3.3  HGB 10.9* 11.3* 11.6*  HCT 32.4* 33.8* 35.9*  MCV 85.5 85.1 85.5  PLT 267 300 344   Cardiac Enzymes: No results for input(s): CKTOTAL, CKMB, CKMBINDEX, TROPONINI in the last 168 hours. BNP: Invalid input(s): POCBNP CBG: No results for input(s): GLUCAP in the last 168 hours. D-Dimer No results for input(s): DDIMER in the last 72 hours. Hgb A1c No results for input(s): HGBA1C in the last 72 hours. Lipid Profile No results for input(s): CHOL, HDL, LDLCALC, TRIG, CHOLHDL, LDLDIRECT in the last 72 hours. Thyroid function studies No results for input(s): TSH, T4TOTAL, T3FREE, THYROIDAB in the last 72 hours.  Invalid input(s): FREET3 Anemia work up No results for input(s): VITAMINB12, FOLATE, FERRITIN, TIBC, IRON, RETICCTPCT in the last 72 hours. Urinalysis    Component Value Date/Time   COLORURINE COLORLESS (A)  02/08/2024 1740   APPEARANCEUR CLEAR (A) 02/08/2024 1740   APPEARANCEUR Clear 03/13/2014 1403   LABSPEC 1.005 02/08/2024 1740   LABSPEC 1.030 03/13/2014 1403   PHURINE 7.0 02/08/2024 1740   GLUCOSEU NEGATIVE 02/08/2024 1740   GLUCOSEU Negative 03/13/2014 1403   HGBUR NEGATIVE 02/08/2024 1740   BILIRUBINUR NEGATIVE 02/08/2024 1740   BILIRUBINUR Negative 03/13/2014 1403   KETONESUR NEGATIVE 02/08/2024 1740   PROTEINUR NEGATIVE 02/08/2024 1740   NITRITE NEGATIVE 02/08/2024 1740   LEUKOCYTESUR NEGATIVE 02/08/2024 1740   LEUKOCYTESUR Negative 03/13/2014 1403   Sepsis Labs Recent Labs  Lab 02/08/24 1410 02/09/24 0506 02/10/24 0610  WBC 7.1 8.6 6.7   Microbiology Recent Results (from the past 240 hours)  MRSA Next Gen by PCR, Nasal     Status: None   Collection Time: 02/10/24  4:50 AM   Specimen: Nasal Mucosa; Nasal Swab  Result Value Ref Range Status   MRSA by PCR Next Gen NOT DETECTED NOT DETECTED Final    Comment: (NOTE) The GeneXpert MRSA Assay (FDA approved for NASAL specimens only), is one component of a comprehensive MRSA colonization surveillance program. It is not intended to diagnose MRSA infection nor to guide or monitor treatment for MRSA infections. Test performance is not FDA approved in patients less than 81 years old. Performed at [facility], [address] [place], [place] [address]      Time coordinating discharge: Over 30 minutes  SIGNED:   Tiajuana Fluke, MD  [facility] 02/11/2024, 1:56 PM Pager   If 7PM-7AM, please contact night-coverage

## 2024-02-18 ENCOUNTER — Emergency Department
Admission: EM | Admit: 2024-02-18 | Discharge: 2024-02-18 | Disposition: A | Discharge status: Home / Self Care | Attending: Emergency Medicine | Admitting: Emergency Medicine

## 2024-02-18 ENCOUNTER — Other Ambulatory Visit: Payer: Self-pay

## 2024-02-18 ENCOUNTER — Encounter: Payer: Self-pay | Admitting: Intensive Care

## 2024-02-18 ENCOUNTER — Emergency Department

## 2024-02-18 DIAGNOSIS — I2510 Atherosclerotic heart disease of native coronary artery without angina pectoris: Secondary | ICD-10-CM | POA: Insufficient documentation

## 2024-02-18 DIAGNOSIS — Z7901 Long term (current) use of anticoagulants: Secondary | ICD-10-CM | POA: Diagnosis not present

## 2024-02-18 DIAGNOSIS — I10 Essential (primary) hypertension: Secondary | ICD-10-CM | POA: Insufficient documentation

## 2024-02-18 DIAGNOSIS — R0602 Shortness of breath: Secondary | ICD-10-CM | POA: Insufficient documentation

## 2024-02-18 DIAGNOSIS — J189 Pneumonia, unspecified organism: Secondary | ICD-10-CM | POA: Insufficient documentation

## 2024-02-18 DIAGNOSIS — E039 Hypothyroidism, unspecified: Secondary | ICD-10-CM | POA: Diagnosis not present

## 2024-02-18 DIAGNOSIS — R059 Cough, unspecified: Secondary | ICD-10-CM | POA: Diagnosis present

## 2024-02-18 LAB — BASIC METABOLIC PANEL WITH GFR
Anion gap: 10 (ref 5–15)
BUN: 23 mg/dL (ref 8–23)
CO2: 22 mmol/L (ref 22–32)
Calcium: 9.1 mg/dL (ref 8.9–10.3)
Chloride: 101 mmol/L (ref 98–111)
Creatinine, Ser: 0.42 mg/dL — ABNORMAL LOW (ref 0.44–1.00)
GFR, Estimated: >60 mL/min (ref >60)
Glucose, Bld: 121 mg/dL — ABNORMAL HIGH (ref 70–99)
Potassium: 3.9 mmol/L (ref 3.5–5.1)
Sodium: 133 mmol/L — ABNORMAL LOW (ref 135–145)

## 2024-02-18 LAB — CBC
HCT: 35.4 % — ABNORMAL LOW (ref 36.0–46.0)
Hemoglobin: 11.1 g/dL — ABNORMAL LOW (ref 12.0–15.0)
MCH: 27.8 pg (ref 26.0–34.0)
MCHC: 31.4 g/dL (ref 30.0–36.0)
MCV: 88.5 fL (ref 80.0–100.0)
Platelets: 304 10*3/uL (ref 150–400)
RBC: 4 MIL/uL (ref 3.87–5.11)
RDW: 15 % (ref 11.5–15.5)
WBC: 13.4 10*3/uL — ABNORMAL HIGH (ref 4.0–10.5)
nRBC: 0 % (ref 0.0–0.2)

## 2024-02-18 LAB — RESP PANEL BY RT-PCR (RSV, FLU A&B, COVID)  RVPGX2
Influenza A by PCR: NEGATIVE
Influenza B by PCR: NEGATIVE
Resp Syncytial Virus by PCR: NEGATIVE
SARS Coronavirus 2 by RT PCR: NEGATIVE

## 2024-02-18 LAB — TROPONIN I (HIGH SENSITIVITY)
Troponin I (High Sensitivity): 7 ng/L (ref <18)
Troponin I (High Sensitivity): 7 ng/L (ref <18)

## 2024-02-18 LAB — URINALYSIS, ROUTINE W REFLEX MICROSCOPIC
Bacteria, UA: NONE SEEN
Bilirubin Urine: NEGATIVE
Glucose, UA: NEGATIVE mg/dL
Hgb urine dipstick: NEGATIVE
Ketones, ur: NEGATIVE mg/dL
Nitrite: NEGATIVE
Protein, ur: 30 mg/dL — AB
Specific Gravity, Urine: 1.033 — ABNORMAL HIGH (ref 1.005–1.030)
pH: 5 (ref 5.0–8.0)

## 2024-02-18 LAB — BRAIN NATRIURETIC PEPTIDE: B Natriuretic Peptide: 435.4 pg/mL — ABNORMAL HIGH (ref 0.0–100.0)

## 2024-02-18 MED ORDER — AMOXICILLIN-POT CLAVULANATE 875-125 MG PO TABS: 1.0000 | ORAL_TABLET | Freq: Two times a day (BID) | ORAL | 0 refills | Status: AC

## 2024-02-18 MED ORDER — AMOXICILLIN-POT CLAVULANATE 875-125 MG PO TABS
1.0000 | ORAL_TABLET | Freq: Once | ORAL | Status: AC
  Administered 2024-02-18: 1 via ORAL
  Filled 2024-02-18: qty 1

## 2024-02-18 NOTE — Discharge Instructions (Signed)
 Take the antibiotic as prescribed and finish the full 1 week course.  This will cover both for pneumonia and urinary tract infection.  Follow-up with your primary care doctor next week.  Take all of your other medications as prescribed and follow the fluid restriction guidelines that you have been doing.  In the meantime, return to the ER for new, worsening, or persistent severe shortness of breath, weakness, lightheadedness, vomiting, fever, or any other new or worsening symptoms that concern you.

## 2024-02-18 NOTE — ED Notes (Signed)
 MD at bedside.

## 2024-02-18 NOTE — ED Triage Notes (Addendum)
 Patient c/o runny nose that started around 2 weeks ago and today low grade fever, chest pressure, and congestion began. Reports some SOB  No appetite for a few days  History CHF

## 2024-02-18 NOTE — ED Provider Notes (Signed)
 [facility] Provider Note    Event Date/Time   First MD Initiated Contact with Patient 02/18/24 1924     (approximate)   History   Chest Pain   HPI  Andrea Griffin is a 89 y.o. female with a history of hypertension, hyperlipidemia, hypothyroidism, nonobstructive CAD, cardiomyopathy, anemia, hyponatremia and paroxysmal atrial fibrillation on Coumadin  who presents with cough over the last several days, nonproductive, associated with some shortness of breath and a sensation of congestion in her chest.  She also reports some chest discomfort over the last day which feels like pressure.  She has had a low-grade subjective fever.  She denies any vomiting but has had some nausea.  She has had intermittent diarrhea over the last few days as well.  She reports some intermittent urinary frequency.  The patient states that she has been fluid restricting and taking diuretic after recent admission for CHF.  I reviewed the past medical records.  The patient was just admitted to the hospitalist service earlier this month and discharged on 613 after presenting with shortness of breath and weakness.  She was treated for acute on chronic CHF with diuresis.   Physical Exam   Triage Vital Signs: ED Triage Vitals  Encounter Vitals Group     BP 02/18/24 1751 (!) 112/97     Girls Systolic BP Percentile --      Girls Diastolic BP Percentile --      Boys Systolic BP Percentile --      Boys Diastolic BP Percentile --      Pulse Rate 02/18/24 1751 74     Resp 02/18/24 1751 20     Temp 02/18/24 1751 98.7 F (37.1 C)     Temp Source 02/18/24 1751 Oral     SpO2 02/18/24 1751 99 %     Weight 02/18/24 1753 138 lb (62.6 kg)     Height 02/18/24 1753 5' 2 (1.575 m)     Head Circumference --      Peak Flow --      Pain Score 02/18/24 1753 5     Pain Loc --      Pain Education --      Exclude from Growth Chart --     Most recent vital signs: Vitals:   02/18/24 2000 02/18/24  2030  BP: 136/64 (!) 146/66  Pulse: (!) 113 (!) 108  Resp: 18 (!) 21  Temp:    SpO2: 94% 100%     General: Alert, well-appearing for age, no distress.  CV:  Good peripheral perfusion.  Resp:  Normal effort.  Lungs CTAB. Abd:  Soft with no focal tenderness.  No distention.  Other:  No peripheral edema.  Moist mucous membranes.   ED Results / Procedures / Treatments   Labs (all labs ordered are listed, but only abnormal results are displayed) Labs Reviewed  BASIC METABOLIC PANEL WITH GFR - Abnormal; Notable for the following components:      Result Value   Sodium 133 (*)    Glucose, Bld 121 (*)    Creatinine, Ser 0.42 (*)    All other components within normal limits  CBC - Abnormal; Notable for the following components:   WBC 13.4 (*)    Hemoglobin 11.1 (*)    HCT 35.4 (*)    All other components within normal limits  BRAIN NATRIURETIC PEPTIDE - Abnormal; Notable for the following components:   B Natriuretic Peptide 435.4 (*)    All other  components within normal limits  URINALYSIS, ROUTINE W REFLEX MICROSCOPIC - Abnormal; Notable for the following components:   Color, Urine AMBER (*)    APPearance CLOUDY (*)    Specific Gravity, Urine 1.033 (*)    Protein, ur 30 (*)    Leukocytes,Ua LARGE (*)    All other components within normal limits  RESP PANEL BY RT-PCR (RSV, FLU A&B, COVID)  [id number]  TROPONIN I (HIGH SENSITIVITY)  TROPONIN I (HIGH SENSITIVITY)     EKG  ED ECG REPORT I, Lind Repine, the attending physician, personally viewed and interpreted this ECG.  Date: 02/18/2024 EKG Time: 1801 Rate: 78 Rhythm: Atrial fibrillation QRS Axis: Left axis Intervals: normal ST/T Wave abnormalities: Nonspecific ST abnormality Narrative Interpretation: no evidence of acute ischemia    RADIOLOGY  Chest x-ray: I independently viewed and interpreted the images; there are bilateral faint opacities, possible atelectasis or pneumonia   PROCEDURES:  Critical Care  performed: No  Procedures   MEDICATIONS ORDERED IN ED: Medications  amoxicillin-clavulanate (AUGMENTIN) 875-125 MG per tablet 1 tablet (1 tablet Oral Given 02/18/24 2055)     IMPRESSION / MDM / ASSESSMENT AND PLAN / ED COURSE  I reviewed the triage vital signs and the nursing notes.  89 year old female with PMH as noted above presents with cough for the last couple of days along with some chest discomfort, subjective fever, and nausea.  On exam the patient is overall well-appearing.  Vital signs are normal.  She has no respiratory distress.  Lungs are clear.  She appears euvolemic.  Differential diagnosis includes, but is not limited to, acute bronchitis, pneumonia, COVID, influenza, or other viral syndrome, UTI, other infection, less likely CHF exacerbation or other cardiac etiology.  BMP and CBC show no acute findings except for mild leukocytosis.  We will obtain a chest x-ray, BNP, troponin, and urinalysis and reassess.  Patient's presentation is most consistent with acute complicated illness / injury requiring diagnostic workup.  The patient is on the cardiac monitor to evaluate for evidence of arrhythmia and/or significant heart rate changes.  ----------------------------------------- 8:54 PM on 02/18/2024 -----------------------------------------  Chest x-ray shows findings of possible pneumonia.  BNP is downtrending from the patient's recent admission.  Troponin is negative.  Urinalysis shows equivocal findings for UTI.  On reassessment, the patient is comfortable appearing and states she feels relatively well.  I did consider whether she may benefit from inpatient admission given her age and comorbidities, however she is not requiring any oxygen, has no respiratory distress, and overall has a reassuring workup.  The patient herself would prefer to go home.  She is stable for discharge at this time.  I have prescribed Augmentin to cover both for CAP as well as possible UTI.  I  counseled the patient and family on the results of the workup.  I gave strict return precautions, and she expressed understanding.  FINAL CLINICAL IMPRESSION(S) / ED DIAGNOSES   Final diagnoses:  Community acquired pneumonia, unspecified laterality     Rx / DC Orders   ED Discharge Orders          Ordered    amoxicillin-clavulanate (AUGMENTIN) 875-125 MG tablet  2 times daily        02/18/24 2052             Note:  This document was prepared using Dragon voice recognition software and may include unintentional dictation errors.    Lind Repine, MD 02/18/24 [phone]
